# Patient Record
Sex: Female | Born: 1945 | Race: Black or African American | Hispanic: No | Smoking: Former smoker
Health system: Southern US, Community
[De-identification: ages and names within clinical notes are randomized; demographics above are authoritative.]

## PROBLEM LIST (undated history)

## (undated) DIAGNOSIS — I10 Essential (primary) hypertension: Secondary | ICD-10-CM

## (undated) DIAGNOSIS — J449 Chronic obstructive pulmonary disease, unspecified: Secondary | ICD-10-CM

## (undated) HISTORY — PX: ABDOMINAL HYSTERECTOMY: SHX81

---

## 2012-08-04 ENCOUNTER — Inpatient Hospital Stay: Payer: Self-pay | Admitting: Internal Medicine

## 2012-08-04 LAB — CBC
HCT: 41.1 % (ref 35.0–47.0)
HGB: 13.8 g/dL (ref 12.0–16.0)
MCH: 29.4 pg (ref 26.0–34.0)
WBC: 7.4 10*3/uL (ref 3.6–11.0)

## 2012-08-04 LAB — BASIC METABOLIC PANEL
Co2: 27 mmol/L (ref 21–32)
Creatinine: 0.51 mg/dL — ABNORMAL LOW (ref 0.60–1.30)
EGFR (Non-African Amer.): >60
Potassium: 3.5 mmol/L (ref 3.5–5.1)
Sodium: 142 mmol/L (ref 136–145)

## 2012-08-04 LAB — CK TOTAL AND CKMB (NOT AT ARMC): CK-MB: 5.7 ng/mL — ABNORMAL HIGH (ref 0.5–3.6)

## 2012-08-04 LAB — TROPONIN I: Troponin-I: <0.02 ng/mL

## 2012-08-04 LAB — CK-MB: CK-MB: 6.7 ng/mL — ABNORMAL HIGH (ref 0.5–3.6)

## 2012-08-04 LAB — MAGNESIUM: Magnesium: 1.9 mg/dL

## 2012-08-05 LAB — BASIC METABOLIC PANEL
BUN: 21 mg/dL — ABNORMAL HIGH (ref 7–18)
Calcium, Total: 9.2 mg/dL (ref 8.5–10.1)
Chloride: 105 mmol/L (ref 98–107)
Creatinine: 0.55 mg/dL — ABNORMAL LOW (ref 0.60–1.30)
EGFR (African American): >60
Potassium: 3.9 mmol/L (ref 3.5–5.1)
Sodium: 138 mmol/L (ref 136–145)

## 2012-08-05 LAB — CBC WITH DIFFERENTIAL/PLATELET
Basophil #: 0 10*3/uL (ref 0.0–0.1)
Eosinophil %: 0 %
HGB: 13.6 g/dL (ref 12.0–16.0)
MCHC: 34 g/dL (ref 32.0–36.0)
MCV: 87 fL (ref 80–100)
Monocyte %: 1.1 %
Neutrophil %: 86.4 %
Platelet: 241 10*3/uL (ref 150–440)
RBC: 4.59 10*6/uL (ref 3.80–5.20)
RDW: 14.7 % — ABNORMAL HIGH (ref 11.5–14.5)

## 2012-09-06 ENCOUNTER — Emergency Department: Payer: Self-pay | Admitting: Internal Medicine

## 2014-07-08 NOTE — H&P (Signed)
PATIENT NAME:  [name] [name], [name] [name] MR#:  [id number] DATE OF BIRTH:  06/02/1945  DATE OF ADMISSION:  08/04/2012  PRIMARY CARE PHYSICIAN: At [facility].  REFERRING PHYSICIAN: Maricela BoLuna Ragsdale, MD  CHIEF COMPLAINT: Shortness of breath for 1 day.   HISTORY OF PRESENT ILLNESS: The patient is a 69 year old African-American female with past medical history of COPD, nicotine dependence and anxiety, who is presenting to the ER with a chief complaint of 1-day history of worsening of shortness of breath. The patient is reporting that she sees [facility], and her primary care physician has been trying to wean her off of steroids for the past several weeks. The patient was on steroids regarding her COPD for several months, and lately, her steroid was tapered to 2.5 mg p.o. by her primary care physician. Following that, the patient became short of breath, and it has been worse for the past 1 day, associated with dry cough. The patient comes to the ER for worsening of shortness of breath. Here, the patient was given neb treatments with no significant improvement. The patient's pulse ox was at 89% on room air, and she is satting 93% to 94% on 2 liters of oxygen. Hospitalist team is called to admit the patient for acute exacerbation of COPD and being hypoxemic. She has received IV Solu-Medrol 125 mg in the ER. Chest x-ray has revealed no acute findings. The patient denies any other complaints of chest pain, nausea, vomiting, diarrhea.   PAST MEDICAL HISTORY: COPD, anxiety, osteoporosis.   PAST SURGICAL HISTORY: None.   ALLERGIES: No known drug allergies.   HOME MEDICATIONS:  1. Prednisone 2.5 mg 2 times a day.  2. Fluticasone/salmeterol 1 puff inhalation 2 times a day. 3. Cholecalciferol 50,000 international units once a month. 4. BuSpar 5 mg 3 times a day. 5. Alendronate 70 mg once a week.   PSYCHOSOCIAL HISTORY: Lives at home with her granddaughter. She used to smoke 2 to 3 cigarettes per day, but  for the past 1 month, she has been not smoking, trying to quit. Denies any alcohol or illicit drug usage.   FAMILY HISTORY: Mother had hypertension. Dad had history of melanoma.   REVIEW OF SYSTEMS:  CONSTITUTIONAL: Denies any fever, fatigue, weight loss or weight gain.  EYES: Denies any blurry vision, glaucoma, cataracts.  ENT: No ear pain, hearing loss. Denies any snoring, postnasal drip.  RESPIRATIONS: Denies any hemoptysis or painful respirations. Has chronic history of COPD.  CARDIOVASCULAR: No chest pain, palpitations or syncope.  GASTROINTESTINAL: No nausea, vomiting, diarrhea, GERD.  GENITOURINARY: No dysuria, hematuria, renal calculi. GYNECOLOGIC AND BREASTS: No breast mass or vaginal discharge.  ENDOCRINE: No polyuria, nocturia or thyroid problems.  HEMATOLOGIC AND LYMPHATIC: No anemia, easy bruising or bleeding.  INTEGUMENTARY: No acne, rash, lesions.  MUSCULOSKELETAL: No joint pain in the neck, back, shoulder. Denies any gout. NEUROLOGIC: No history of vertigo, ataxia, dementia, CVA.  PSYCHIATRIC: No ADD, OCD or bipolar disorder. Has chronic history of anxiety.  PHYSICAL EXAMINATION: VITAL SIGNS: Temperature 96.6, pulse 113, respirations 16 to 18, blood pressure 155/75, pulse ox 95% on 2 liters.  GENERAL APPEARANCE: Not in any acute distress. Moderately built and moderately nourished.  HEENT: Normocephalic, atraumatic. Pupils are equally reacting to light and accommodation. No scleral icterus. No conjunctival injection. Extraocular movements are intact. No sinus tenderness. No postnasal drip. No pharyngeal exudates.  NECK: Supple. No JVD. No thyromegaly. No lymphadenopathy.  LUNGS: Moderate air entry. Minimal end-expiratory wheezing is present. No accessory muscle  usage. No anterior chest wall tenderness on palpation.  CARDIAC: S1, S2 normal, tachycardic. No murmurs. No gallops. No edema.  GASTROINTESTINAL: Soft. Bowel sounds are positive in all 4 quadrants. Nontender,  nondistended. No masses felt. No hepatosplenomegaly.  NEUROLOGIC: Awake, alert and oriented x3. Cranial nerves II through XII are grossly intact. Motor and sensory are intact. Reflexes are 2+.  EXTREMITIES: No edema. No cyanosis. No clubbing.  MUSCULOSKELETAL: No joint effusion, tenderness or erythema.  SKIN: Warm to touch. Normal turgor. No rashes. No lesions.   DIAGNOSTIC STUDIES: Chest x-ray has chronic COPD changes, prominent vasculature. A 12-lead EKG: Sinus tachycardia with premature atrial complexes, normal PR interval. Left ventricular hypertrophy. No ST-T wave changes acutely. Glucose 98, BUN 14, creatinine 0.51, sodium 140, potassium 3.5, chloride 108, CO2 27, anion gap 7, GFR greater than 60, serum osmolality 284, calcium 9.2. CK total 325, CPK-MB 5.7, troponin less than 0.02. WBC 7.4, hemoglobin 13.8, hematocrit 41.1, platelets 230.   ASSESSMENT AND PLAN:  1. Acute exacerbation of chronic obstructive pulmonary disease. Will admit her to tele bed. Solu-Medrol 60 mg IV q.6 hours will be give. DuoNeb treatments will be provided q.4 hours. Albuterol on as-needed basis. Levofloxacin IV q.24 hours.  2. Anxiety. Continue her home medication, BuSpar.  3. Osteoporosis. Continue alendronate once weekly.  4. Nicotine dependence. The patient is counseled to quit smoking and will provide her nicotine patch.  5. Will provide her gastrointestinal and deep vein thrombosis prophylaxis.   The diagnosis and plan of care were discussed in detail with the patient and her daughter at bedside. They verbalized understanding of the plan.   TOTAL TIME SPENT ON ADMISSION: 50 minutes.   ____________________________ Ramonita Lab, MD ag:OSi D: 08/04/2012 05:45:05 ET T: 08/04/2012 06:59:02 ET JOB#: [id number]  cc: Ramonita Lab, MD, <Dictator> [facility] Ramonita Lab MD ELECTRONICALLY SIGNED 08/06/2012 0:38

## 2014-07-08 NOTE — Discharge Summary (Signed)
PATIENT NAME:  [name] [name], [name] [name] MR#:  [id number] DATE OF BIRTH:  1946/03/04  DATE OF ADMISSION:  08/04/2012 DATE OF DISCHARGE:  08/05/2012   DISCHARGE DIAGNOSES:  1. Acute respiratory failure.  2. Acute chronic obstructive pulmonary disease exacerbation.  3. Tobacco abuse.  4. Hypertension, newly diagnosed.  5. Premature ventricular contractions.   CONSULTATIONS: None.   IMAGING STUDIES DONE: Included a chest x-ray which showed no acute infiltrate, pulmonary edema or pleural effusion.   ADMITTING HISTORY AND PHYSICAL: Please see detailed [name] and P dictated by Dr. Amado CoeGouru on 08/04/2012. In brief, a 69 year old African-American female patient who is a smoker, history of COPD, on chronic steroids, who presented to the hospital complaining of worsening shortness of breath and dry cough. The patient was admitted to the hospitalist service after she had acute respiratory failure, needing 2 liters of oxygen to keep her sats over 88%.   HOSPITAL COURSE:  1. Acute chronic obstructive pulmonary disease exacerbation. The patient was started on scheduled nebulizers, IV steroids and Levaquin. She was continued on 2 liters oxygen, and by the day of discharge, the patient responded well to the treatment, without any wheezing, ambulating without any trouble and sats of 94% on room air on ambulation. The patient has been started on a prednisone taper and will be discharged back home to follow up with her primary care physician.  2. The patient also had elevated blood pressure into the 150s with tachycardia, with occasional PVCs. Has been started on low-dose metoprolol for the same, and her heart rate is much improved and in normal sinus rhythm.   Today on exam, she does not have any wheezing, has good air entry on both sides, ambulated on her own.   DISCHARGE MEDICATIONS: Include: 1. Albuterol nebulizer inhaled every 6 hours as needed.  2. Alendronate 70 mg oral once a week.  3. Cholecalciferol 50,000 international  units oral once a month.  4. Fluticasone/salmeterol 500/50 one puff inhaled 2 times a day.  5. Buspirone 5 mg oral 3 times a day.  6. Prednisone 60 mg tapered over 10 days. The patient to resume her previous 2.5 mg dose daily after this.  7. Metoprolol tartrate 25 mg oral 2 times a day.  8. Levaquin 500 mg oral once a day for 5 days.   DISCHARGE INSTRUCTIONS:  1. Low-sodium diet.  2. Activity as tolerated.  3. Follow up with primary care physician in 1 to 2 weeks.  4. The patient has been counseled to quit smoking.   TIME SPENT: The time spent on day of discharge in discharge activity was 40 minutes.   ____________________________ Molinda BailiffSrikar R. Makenley Shimp, MD srs:OSi D: 08/05/2012 14:59:49 ET T: 08/05/2012 15:17:13 ET JOB#: [id number]  cc: Wardell HeathSrikar R. Gelisa Tieken, MD, <Dictator> [place] [facility][name].F[facility] Dr. Jory SimsHines  [name] [name] R Joshuan Bolander MD ELECTRONICALLY SIGNED 08/17/2012 23:29

## 2014-09-04 ENCOUNTER — Inpatient Hospital Stay: Payer: Medicare Other

## 2014-09-04 ENCOUNTER — Encounter: Payer: Self-pay | Admitting: Emergency Medicine

## 2014-09-04 ENCOUNTER — Inpatient Hospital Stay
Admission: EM | Admit: 2014-09-04 | Discharge: 2014-09-12 | DRG: 208 | Disposition: A | Discharge status: Home Health | Payer: Medicare Other | Attending: Radiology | Admitting: Radiology

## 2014-09-04 ENCOUNTER — Emergency Department: Payer: Medicare Other

## 2014-09-04 DIAGNOSIS — Z808 Family history of malignant neoplasm of other organs or systems: Secondary | ICD-10-CM | POA: Diagnosis not present

## 2014-09-04 DIAGNOSIS — Z7952 Long term (current) use of systemic steroids: Secondary | ICD-10-CM | POA: Diagnosis not present

## 2014-09-04 DIAGNOSIS — J449 Chronic obstructive pulmonary disease, unspecified: Secondary | ICD-10-CM | POA: Diagnosis present

## 2014-09-04 DIAGNOSIS — I248 Other forms of acute ischemic heart disease: Secondary | ICD-10-CM | POA: Diagnosis present

## 2014-09-04 DIAGNOSIS — Z452 Encounter for adjustment and management of vascular access device: Secondary | ICD-10-CM

## 2014-09-04 DIAGNOSIS — B9689 Other specified bacterial agents as the cause of diseases classified elsewhere: Secondary | ICD-10-CM | POA: Diagnosis present

## 2014-09-04 DIAGNOSIS — J9602 Acute respiratory failure with hypercapnia: Secondary | ICD-10-CM | POA: Diagnosis present

## 2014-09-04 DIAGNOSIS — K219 Gastro-esophageal reflux disease without esophagitis: Secondary | ICD-10-CM | POA: Diagnosis present

## 2014-09-04 DIAGNOSIS — J069 Acute upper respiratory infection, unspecified: Secondary | ICD-10-CM

## 2014-09-04 DIAGNOSIS — J40 Bronchitis, not specified as acute or chronic: Secondary | ICD-10-CM | POA: Diagnosis present

## 2014-09-04 DIAGNOSIS — Z8249 Family history of ischemic heart disease and other diseases of the circulatory system: Secondary | ICD-10-CM

## 2014-09-04 DIAGNOSIS — I77810 Thoracic aortic ectasia: Secondary | ICD-10-CM | POA: Diagnosis present

## 2014-09-04 DIAGNOSIS — J441 Chronic obstructive pulmonary disease with (acute) exacerbation: Secondary | ICD-10-CM | POA: Diagnosis present

## 2014-09-04 DIAGNOSIS — Z9911 Dependence on respirator [ventilator] status: Secondary | ICD-10-CM

## 2014-09-04 DIAGNOSIS — J9690 Respiratory failure, unspecified, unspecified whether with hypoxia or hypercapnia: Secondary | ICD-10-CM | POA: Diagnosis present

## 2014-09-04 DIAGNOSIS — Z87891 Personal history of nicotine dependence: Secondary | ICD-10-CM

## 2014-09-04 DIAGNOSIS — N179 Acute kidney failure, unspecified: Secondary | ICD-10-CM | POA: Diagnosis not present

## 2014-09-04 DIAGNOSIS — M810 Age-related osteoporosis without current pathological fracture: Secondary | ICD-10-CM | POA: Diagnosis present

## 2014-09-04 DIAGNOSIS — J9801 Acute bronchospasm: Secondary | ICD-10-CM

## 2014-09-04 DIAGNOSIS — F449 Dissociative and conversion disorder, unspecified: Secondary | ICD-10-CM | POA: Diagnosis not present

## 2014-09-04 DIAGNOSIS — I429 Cardiomyopathy, unspecified: Secondary | ICD-10-CM | POA: Diagnosis present

## 2014-09-04 DIAGNOSIS — J9611 Chronic respiratory failure with hypoxia: Secondary | ICD-10-CM

## 2014-09-04 DIAGNOSIS — Z515 Encounter for palliative care: Secondary | ICD-10-CM | POA: Diagnosis not present

## 2014-09-04 DIAGNOSIS — Z4659 Encounter for fitting and adjustment of other gastrointestinal appliance and device: Secondary | ICD-10-CM

## 2014-09-04 DIAGNOSIS — I10 Essential (primary) hypertension: Secondary | ICD-10-CM | POA: Diagnosis present

## 2014-09-04 DIAGNOSIS — Z7951 Long term (current) use of inhaled steroids: Secondary | ICD-10-CM | POA: Diagnosis not present

## 2014-09-04 DIAGNOSIS — N390 Urinary tract infection, site not specified: Secondary | ICD-10-CM | POA: Diagnosis present

## 2014-09-04 HISTORY — DX: Essential (primary) hypertension: I10

## 2014-09-04 HISTORY — DX: Chronic obstructive pulmonary disease, unspecified: J44.9

## 2014-09-04 LAB — BLOOD GAS, ARTERIAL
Acid-base deficit: 2.1 mmol/L — ABNORMAL HIGH (ref 0.0–2.0)
Allens test (pass/fail): POSITIVE — AB
Bicarbonate: 24.7 mEq/L (ref 21.0–28.0)
FIO2: 0.28 %
MECHVT: 500 mL
Mechanical Rate: 24
O2 Saturation: 87.4 %
PATIENT TEMPERATURE: 37
PEEP: 5 cmH2O
pCO2 arterial: 49 mmHg — ABNORMAL HIGH (ref 32.0–48.0)
pH, Arterial: 7.31 — ABNORMAL LOW (ref 7.350–7.450)
pO2, Arterial: 59 mmHg — ABNORMAL LOW (ref 83.0–108.0)

## 2014-09-04 LAB — HEPATIC FUNCTION PANEL
ALBUMIN: 4 g/dL (ref 3.5–5.0)
ALT: 23 U/L (ref 14–54)
AST: 34 U/L (ref 15–41)
Alkaline Phosphatase: 85 U/L (ref 38–126)
Bilirubin, Direct: 0.3 mg/dL (ref 0.1–0.5)
Indirect Bilirubin: 0.3 mg/dL (ref 0.3–0.9)
TOTAL PROTEIN: 7.8 g/dL (ref 6.5–8.1)
Total Bilirubin: 0.6 mg/dL (ref 0.3–1.2)

## 2014-09-04 LAB — URINALYSIS COMPLETE WITH MICROSCOPIC (ARMC ONLY)
BILIRUBIN URINE: NEGATIVE
Glucose, UA: NEGATIVE mg/dL
Ketones, ur: NEGATIVE mg/dL
Leukocytes, UA: NEGATIVE
Nitrite: NEGATIVE
PH: 5 (ref 5.0–8.0)
PROTEIN: 100 mg/dL — AB
SPECIFIC GRAVITY, URINE: 1.02 (ref 1.005–1.030)

## 2014-09-04 LAB — BASIC METABOLIC PANEL
Anion gap: 9 (ref 5–15)
BUN: 11 mg/dL (ref 6–20)
CO2: 29 mmol/L (ref 22–32)
Calcium: 8.7 mg/dL — ABNORMAL LOW (ref 8.9–10.3)
Chloride: 100 mmol/L — ABNORMAL LOW (ref 101–111)
Creatinine, Ser: 0.61 mg/dL (ref 0.44–1.00)
GFR calc Af Amer: >60 mL/min (ref >60)
GFR calc non Af Amer: >60 mL/min (ref >60)
Glucose, Bld: 168 mg/dL — ABNORMAL HIGH (ref 65–99)
Potassium: 4.8 mmol/L (ref 3.5–5.1)
Sodium: 138 mmol/L (ref 135–145)

## 2014-09-04 LAB — CBC
HCT: 46.7 % (ref 35.0–47.0)
HEMOGLOBIN: 15 g/dL (ref 12.0–16.0)
MCH: 29.4 pg (ref 26.0–34.0)
MCHC: 32 g/dL (ref 32.0–36.0)
MCV: 91.9 fL (ref 80.0–100.0)
Platelets: 203 10*3/uL (ref 150–440)
RBC: 5.08 MIL/uL (ref 3.80–5.20)
RDW: 14.3 % (ref 11.5–14.5)
WBC: 9.6 10*3/uL (ref 3.6–11.0)

## 2014-09-04 LAB — MRSA PCR SCREENING: MRSA by PCR: NEGATIVE

## 2014-09-04 LAB — GLUCOSE, CAPILLARY: Glucose-Capillary: 143 mg/dL — ABNORMAL HIGH (ref 65–99)

## 2014-09-04 LAB — HEMOGLOBIN A1C: Hgb A1c MFr Bld: 6 % (ref 4.0–6.0)

## 2014-09-04 LAB — TROPONIN I
Troponin I: <0.03 ng/mL (ref <0.031)
Troponin I: 0.38 ng/mL — ABNORMAL HIGH (ref <0.031)

## 2014-09-04 MED ORDER — ALBUTEROL SULFATE (2.5 MG/3ML) 0.083% IN NEBU
INHALATION_SOLUTION | RESPIRATORY_TRACT | Status: AC
  Administered 2014-09-04: 11:00:00
  Filled 2014-09-04: qty 12

## 2014-09-04 MED ORDER — CETYLPYRIDINIUM CHLORIDE 0.05 % MT LIQD
7.0000 mL | Freq: Four times a day (QID) | OROMUCOSAL | Status: DC
  Administered 2014-09-05 – 2014-09-07 (×10): 7 mL via OROMUCOSAL

## 2014-09-04 MED ORDER — FENTANYL 2500MCG IN NS 250ML (10MCG/ML) PREMIX INFUSION
10.0000 ug/h | INTRAVENOUS | Status: DC
  Administered 2014-09-04 – 2014-09-05 (×2): 10 ug/h via INTRAVENOUS
  Administered 2014-09-05: 175 ug/h via INTRAVENOUS
  Administered 2014-09-06: 20 ug/h via INTRAVENOUS
  Filled 2014-09-04 (×4): qty 250

## 2014-09-04 MED ORDER — FENTANYL CITRATE (PF) 100 MCG/2ML IJ SOLN
INTRAMUSCULAR | Status: AC
  Filled 2014-09-04: qty 2

## 2014-09-04 MED ORDER — LORAZEPAM 2 MG/ML IJ SOLN
INTRAMUSCULAR | Status: AC
  Filled 2014-09-04: qty 1

## 2014-09-04 MED ORDER — PANTOPRAZOLE SODIUM 40 MG IV SOLR
40.0000 mg | Freq: Every day | INTRAVENOUS | Status: DC
  Administered 2014-09-04: 40 mg via INTRAVENOUS
  Filled 2014-09-04: qty 40

## 2014-09-04 MED ORDER — SUCCINYLCHOLINE CHLORIDE 20 MG/ML IJ SOLN
INTRAMUSCULAR | Status: DC | PRN
  Administered 2014-09-04: 120 mg via INTRAVENOUS

## 2014-09-04 MED ORDER — NOREPINEPHRINE 4 MG/250ML-% IV SOLN
0.0000 ug/min | INTRAVENOUS | Status: DC
  Administered 2014-09-05: 2 ug/min via INTRAVENOUS
  Filled 2014-09-04: qty 250

## 2014-09-04 MED ORDER — PANTOPRAZOLE SODIUM 40 MG IV SOLR: 40.0000 mg | Freq: Two times a day (BID) | INTRAVENOUS | Status: DC

## 2014-09-04 MED ORDER — IOHEXOL 350 MG/ML SOLN
75.0000 mL | Freq: Once | INTRAVENOUS | Status: AC | PRN
  Administered 2014-09-04: 75 mL via INTRAVENOUS

## 2014-09-04 MED ORDER — METOPROLOL TARTRATE 25 MG PO TABS
25.0000 mg | ORAL_TABLET | Freq: Two times a day (BID) | ORAL | Status: DC
  Administered 2014-09-04 – 2014-09-12 (×14): 25 mg via ORAL
  Filled 2014-09-04 (×14): qty 1

## 2014-09-04 MED ORDER — PROPOFOL 1000 MG/100ML IV EMUL
5.0000 ug/kg/min | Freq: Once | INTRAVENOUS | Status: AC
  Administered 2014-09-04: 5 ug/kg/min via INTRAVENOUS

## 2014-09-04 MED ORDER — PNEUMOCOCCAL VAC POLYVALENT 25 MCG/0.5ML IJ INJ
0.5000 mL | INJECTION | INTRAMUSCULAR | Status: DC
  Filled 2014-09-04: qty 0.5

## 2014-09-04 MED ORDER — NOREPINEPHRINE BITARTRATE 1 MG/ML IV SOLN: 0.0000 ug/min | INTRAVENOUS | Status: DC

## 2014-09-04 MED ORDER — ALBUTEROL (5 MG/ML) CONTINUOUS INHALATION SOLN
10.0000 mg/h | INHALATION_SOLUTION | Freq: Once | RESPIRATORY_TRACT | Status: AC
Start: 2014-09-04 — End: 2014-09-04
  Administered 2014-09-04: 10 mg/h via RESPIRATORY_TRACT
  Filled 2014-09-04: qty 20

## 2014-09-04 MED ORDER — FENTANYL CITRATE (PF) 100 MCG/2ML IJ SOLN
75.0000 ug | Freq: Once | INTRAMUSCULAR | Status: AC
  Administered 2014-09-04: 75 ug via INTRAVENOUS

## 2014-09-04 MED ORDER — HEPARIN SODIUM (PORCINE) 5000 UNIT/ML IJ SOLN
5000.0000 [IU] | Freq: Three times a day (TID) | INTRAMUSCULAR | Status: DC
  Administered 2014-09-04 – 2014-09-05 (×3): 5000 [IU] via SUBCUTANEOUS
  Filled 2014-09-04 (×3): qty 1

## 2014-09-04 MED ORDER — LORAZEPAM 2 MG/ML IJ SOLN
1.0000 mg | Freq: Once | INTRAMUSCULAR | Status: AC
  Administered 2014-09-04: 1 mg via INTRAVENOUS

## 2014-09-04 MED ORDER — CHLORHEXIDINE GLUCONATE 0.12 % MT SOLN
15.0000 mL | Freq: Two times a day (BID) | OROMUCOSAL | Status: DC
  Administered 2014-09-04 – 2014-09-07 (×6): 15 mL via OROMUCOSAL

## 2014-09-04 MED ORDER — MAGNESIUM SULFATE 2 GM/50ML IV SOLN
2.0000 g | Freq: Once | INTRAVENOUS | Status: AC
  Administered 2014-09-04: 2 g via INTRAVENOUS

## 2014-09-04 MED ORDER — ETOMIDATE 2 MG/ML IV SOLN
INTRAVENOUS | Status: DC | PRN
  Administered 2014-09-04: 20 mg via INTRAVENOUS

## 2014-09-04 MED ORDER — DEXMEDETOMIDINE HCL IN NACL 400 MCG/100ML IV SOLN: 0.4000 ug/kg/h | INTRAVENOUS | Status: DC

## 2014-09-04 MED ORDER — PROPOFOL 1000 MG/100ML IV EMUL
INTRAVENOUS | Status: AC
  Administered 2014-09-04: 5 ug/kg/min via INTRAVENOUS
  Filled 2014-09-04: qty 100

## 2014-09-04 MED ORDER — IPRATROPIUM-ALBUTEROL 0.5-2.5 (3) MG/3ML IN SOLN
3.0000 mL | RESPIRATORY_TRACT | Status: DC
  Administered 2014-09-04 – 2014-09-08 (×23): 3 mL via RESPIRATORY_TRACT
  Filled 2014-09-04 (×23): qty 3

## 2014-09-04 MED ORDER — ASPIRIN 81 MG PO CHEW
81.0000 mg | CHEWABLE_TABLET | Freq: Every day | ORAL | Status: DC
  Administered 2014-09-04 – 2014-09-08 (×4): 81 mg via ORAL
  Filled 2014-09-04 (×5): qty 1

## 2014-09-04 MED ORDER — METHYLPREDNISOLONE SODIUM SUCC 125 MG IJ SOLR
60.0000 mg | Freq: Four times a day (QID) | INTRAMUSCULAR | Status: DC
  Administered 2014-09-04 – 2014-09-05 (×4): 60 mg via INTRAVENOUS
  Filled 2014-09-04 (×5): qty 2

## 2014-09-04 MED ORDER — LEVOFLOXACIN IN D5W 500 MG/100ML IV SOLN
500.0000 mg | INTRAVENOUS | Status: DC
  Administered 2014-09-04: 500 mg via INTRAVENOUS
  Filled 2014-09-04 (×2): qty 100

## 2014-09-04 NOTE — ED Notes (Signed)
Patient to ED via EMS with c.o respiratory distress. Per EMS patient has had increase of difficulty breathing for two days. Patient initial room saturations of 85%. Patient placed on CPAP by EMS, saturations elevated to 93%. Patient alert and oriented. Patient can follow instructions. Patient hx COPD. Patient denies any history of previous intubations. Patient CBG 144. Bilateral wheezing.

## 2014-09-04 NOTE — Progress Notes (Signed)
DR. Dema Severin present and gave order for fentanyl drip and precedex drips and to d/c propofol. MD also aware that patient's NG tube is at 28cm at nare and that it hasnt been verified. Dr. Dema Severin stated "take the NG out and place an OG and get a KUB to verify."

## 2014-09-04 NOTE — Progress Notes (Addendum)
Spoke with Dr. Thedore Mins about Pt's drop in BP 51/35, HR 110-120's, and elevated Troponin .38. Orders for a PICC line to be placed and Levophed were given to address Pt's drop in BP. [facility] Cardiology consult was ordered as well as a daily Aspirin.

## 2014-09-04 NOTE — ED Provider Notes (Signed)
[facility] Emergency Department Provider Note   ____________________________________________  Time seen: On arrival 8:50 AM by EMS I have reviewed the triage vital signs and the triage nursing note.  HISTORY  Chief Complaint Respiratory Distress   Historian Limited per patient due to respiratory distress, EMS provides history  HPI [name] [name] is a 69 y.o. female who is having respiratory distress and trouble breathing. Apparently this started about 2 days ago. She does not have a history of breathing problems reportedly. She was severely wheezing upon EMS arrival and was given a DuoNeb treatment followed by CPAP due to the fact that she did not improve very well. She denies chest pain. Symptoms are severe with regard to the respiratory distress. No recent cough or fevers.    Past Medical History  Diagnosis Date  . COPD (chronic obstructive pulmonary disease)   . Hypertension    unknown  Patient Active Problem List   Diagnosis Date Noted  . COPD exacerbation 09/04/2014    History reviewed. No pertinent past surgical history.  Current Outpatient Rx  Name  Route  Sig  Dispense  Refill  . ADVAIR DISKUS 500-50 MCG/DOSE AEPB   Inhalation   Inhale 1 puff into the lungs every 12 (twelve) hours.           Dispense as written.   [name] albuterol (PROVENTIL) (2.5 MG/3ML) 0.083% nebulizer solution   Inhalation   Inhale 3 mLs into the lungs every 6 (six) hours as needed. For wheezing         . metoprolol tartrate (LOPRESSOR) 25 MG tablet   Oral   Take 25 mg by mouth 2 (two) times daily.         . predniSONE (DELTASONE) 5 MG tablet   Oral   Take 5 mg by mouth daily.         . VENTOLIN HFA 108 (90 BASE) MCG/ACT inhaler   Inhalation   Inhale 2 puffs into the lungs every 6 (six) hours as needed. For wheezing           Dispense as written.   . Vitamin D, Ergocalciferol, (DRISDOL) 50000 UNITS CAPS capsule   Oral   Take 50,000 Units by mouth  once a week.           Allergies Review of patient's allergies indicates no known allergies. None No family history on file.  Social History History  Substance Use Topics  . Smoking status: Former Games developer  . Smokeless tobacco: Not on file  . Alcohol Use: No   unable to obtain due to patient's respiratory distress   Review of Systems Limited due to patient's respiratory distress Constitutional: Negative for fever. Eyes:  ENT:  Cardiovascular: Negative for chest pain. Respiratory: Positive for shortness of breath. Gastrointestinal: Negative for abdominal pain, vomiting and diarrhea. Genitourinary:  Musculoskeletal: Skin:  Neurological: Negative for focal weakness or numbness.  ____________________________________________   PHYSICAL EXAM:  VITAL SIGNS: ED Triage Vitals  Enc Vitals Group     BP 09/04/14 0853 165/100 mmHg     Pulse Rate 09/04/14 0853 124     Resp 09/04/14 0853 30     Temp --      Temp src --      SpO2 09/04/14 0853 100 %     Weight 09/04/14 0853 169 lb (76.658 kg)     Height 09/04/14 0853  (1.651 m)     Head Cir --      Peak Flow --  Pain Score --      Pain Loc --      Pain Edu? --      Excl. in GC? --      Constitutional: Alert.  Trying to talk around BiPAP. Moderate respiratory distress  Eyes: Conjunctivae are normal. PERRL. Normal extraocular movements. ENT   Head: Normocephalic and atraumatic.   Nose: No congestion/rhinnorhea.   Mouth/Throat: Mucous membranes are moist.   Neck: No stridor. Cardiovascular: Tachycardic and regular.  No murmurs, rubs, or gallops. Respiratory: Severe decreased air movement throughout with significant wheezing in all lung fields. Moderate retractions.  Gastrointestinal: Soft. No distention, no guarding, no rebound. Nontender  Genitourinary/rectal: Deferred Musculoskeletal: Nontender with normal range of motion in all extremities. Plus edema bilateral lower shoulder is without calf  tenderness.  Neurologic: No gross focal neurologic deficits are appreciated. No confusion. Patient is anxious and slightly agitated. Skin:  Skin is warm, dry and intact. No rash noted.   ____________________________________________   EKG I, Governor Rooks, MD, the attending physician have personally viewed and interpreted all ECGs.  125 beats a minute. Sinus tachycardia. Narrow QRS. Normal axis. Nonspecific ST and T-wave. ____________________________________________  LABS (pertinent positives/negatives)  PH 7.16 with a PCO2 of 91 Metabolic panel within normal limits CBC within normal limits Troponin less than 0.03  ____________________________________________  RADIOLOGY Imaging viewed by me, and interpreted by Radiologist   Chest x-ray one: Boardline cardiac megaly without decompensation Chest x-ray 2 post intubation: Discussed with the radiologist: ET tube in good position, indeterminate location of the NG tube and cannot exclude endotracheal course.   CT chest noncontrast:   IMPRESSION: No evidence of acute pulmonary thromboembolism.  Bilateral tiny ill-defined nodules and ground-glass opacities most likely due to an inflammatory process. Three-month follow-up CT is recommended to ensure resolution of these findings. Initial follow-up by chest CT without contrast is recommended in 3 months to confirm persistence. This recommendation follows the consensus statement: Recommendations for the Management of Subsolid Pulmonary Nodules Detected at CT: A Statement from the Fleischner Society as published in Radiology 2013; 266:304-317.  Emphysema.  Endotracheal and NG tubes.  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 Recommend semi-annual imaging followup by CTA or MRA and referral to cardiothoracic surgery if not already obtained. This recommendation follows 2010 ACCF/AHA/AATS/ACR/ASA/SCA/SCAI/SIR/STS/SVM Guidelines for the Diagnosis and Management of  Patients With Thoracic Aortic Disease. Circulation. 2010; 121: [id number] __________________________________________  PROCEDURES  Procedure(s) performed:  INTUBATION Performed by: Governor Rooks  Required items: required blood products, implants, devices, and special equipment available Patient identity confirmed: provided demographic data and hospital-assigned identification number Time out: Immediately prior to procedure a "time out" was called to verify the correct patient, procedure, equipment, support staff and site/side marked as required.  Indications: hypercarbic respiratory failure  Intubation method: Glidescope Laryngoscopy   Preoxygenation: 75% oxygenation on BiPAP for 5 minutes Sedatives: Etomidate Paralytic: 120 mg Succinylcholine  Tube Size: 8.0 cuffed  Post-procedure assessment: chest rise and ETCO2 monitor Breath sounds: equal and absent over the epigastrium Tube secured with: ETT holder   Patient tolerated the procedure well with no immediate complications.    Critical Care performed: CRITICAL CARE Performed by: Governor Rooks   Total critical care time: 70 minutes  Critical care time was exclusive of separately billable procedures and treating other patients.  Critical care was necessary to treat or prevent imminent or life-threatening deterioration.  Critical care was time spent personally by me on the following activities: development of treatment plan with  patient and/or surrogate as well as nursing, discussions with consultants, evaluation of patient's response to treatment, examination of patient, obtaining history from patient or surrogate, ordering and performing treatments and interventions, ordering and review of laboratory studies, ordering and review of radiographic studies, pulse oximetry and re-evaluation of patient's condition.   ____________________________________________   ED COURSE / ASSESSMENT AND PLAN  Pertinent labs &  imaging results that were available during my care of the patient were reviewed by me and considered in my medical decision making (see chart for details).  Patient arrived on CPAP, and this was continued. Patient is not hypoxic on the BiPAP, however she was hypoxic to 85% per EMS in the field when they got her. Symptoms seem likely bronchospasm however and his heart failure is a consideration. She is in moderate respiratory distress, but CPAP was helping, and without any altered mental status and some reported improvement, no indication for intubation at this  Point in time.  On reexamination patient seems more somnolent despite being on CPAP. I updated the family with the likelihood that if her CO2 is elevated she may need tracheal intubation. I went over the scope of care and family does want her to be DO NOT RESUSCITATE, however for reversible pulmonary cause they are interested in having her intubated necessary.  Her ABG did show acidosis, respiratory. Preparations were made for intubation.  Patient was intubated without problem. She was placed on propofol for sedation.  Profile caused some mild decreased blood pressure, an additional dose of when necessary fentanyl was given with good sedation.  Radiologist called with the equivocal report of the NG tube, and suggested either advancing further and repeat x-ray in, or pulling the tube out. I told the nurse to pull the tube out and replace it which she did. I consulted the hospitalist for admission.  The admitting physician recommended CT of the chest to evaluate the NG tube to issue to assess for any concern about perforation. Clinically the patient is not having any change in her clinical status.   I discussed return precautions, follow-up instructions, and discharged instructions with patient and/or family.  ___________________________________________   FINAL CLINICAL IMPRESSION(S) / ED DIAGNOSES   Final diagnoses:  Bronchospasm,  acute  Acute respiratory failure with hypercapnia      Governor Rooks, MD 09/04/14 1457

## 2014-09-04 NOTE — Progress Notes (Signed)
Sedated comfortably on fentanyl drip. VSS. Stach per cardiac monitor. Daughter and grandaughter visited and were updated on plan of care. This RN paged Dr. Clent Ridges to ask her to look at KUB to verify OG tube placement but MD has not returned page.

## 2014-09-04 NOTE — Progress Notes (Signed)
PULMONARY / CRITICAL CARE MEDICINE   Name: [name] [name] MRN: [id number] DOB: 02-Apr-1945    ADMISSION DATE:  09/04/2014 CONSULTATION DATE:  09/04/14  REFERRING MD :  Dr. Clent Ridges   CHIEF COMPLAINT:    "short of breath"  HISTORY OF PRESENT ILLNESS   69 yo female with PMHx of COPD (Severe), Nicotine abuse, OA, presented to [facility] ED with acute shortness of breath after having "URI" symptoms for the past couple of days, sick contact possibly grand kids.  History per chart, as patient is intubated and sedated.  She was recently seen on 09/01/14 at her PMD office, Dr. Marcello Fennel, and was noted to be in normal state of health.  Smokes 1 pack every 3 days - per clinic note on 6/16. In the ED, was initially place on bipap, no improvement in VBG (ph 7.16, pCO2 91), worsening respiratory distress and was intubated given poor respiratory status and hypercapnia.    Dr. Meredeth Ide Office Visit 04/05/14 History of Present Illness: [name] [name] is a 69 y.o. female that presents to clinic for follow up for severe copd, has a cough, min phlegm. There is no blood. No fever or chills. No chest pain or pleurisy. No leg edema or calf pain. She is not on spiriva.   Problem List:  Patient Active Problem List  Diagnosis  . COPD, severe   Current Medications:  Current Outpatient Prescriptions  Medication Sig Dispense Refill  . albuterol (PROVENTIL) 2.5 mg /3 mL (0.083 %) nebulizer solution Take 3 mLs (2.5 mg total) by nebulization every 6 (six) hours as needed for Wheezing. 120 ampule 5  . albuterol 90 mcg/actuation inhaler Inhale 2 inhalations into the lungs every 6 (six) hours as needed for Wheezing. 1 Inhaler 12  . fluticasone-salmeterol (ADVAIR DISKUS) 500-50 mcg/dose diskus inhaler Inhale 1 inhalation into the lungs every 12 (twelve) hours. 1 Inhaler 12  . metoprolol tartrate (LOPRESSOR) 25 MG tablet Take 1 tablet (25 mg total) by mouth 2 (two) times daily. 60 tablet 3  . predniSONE (DELTASONE) 5 MG tablet Take 1  tablet (5 mg total) by mouth once daily. 30 tablet 6   SPIROMETRY: FVC was 2.06 liters, 89% of predicted FEV1 was 0.77, 42% of predicted FEV1 ratio was 38 FEF 25-75% liters per second was 10% of predicted  FLOW VOLUME LOOP: Expiratory flow volume loop is quite delayed  Summary: Severe obstruction on spirometry  Impression: Severe copd, no change, holding her own.  Slow resolving bronchitis   Plan: -stay on the same copd regimen -mucinex -start spiriva  -zpak -follow up in 18 weeks    SIGNIFICANT EVENTS  6/19 - intubated for hypercapenic respiratory failure in [facility] ED      PAST MEDICAL HISTORY    :  Past Medical History  Diagnosis Date  . COPD (chronic obstructive pulmonary disease)   . Hypertension    Past Surgical History  Procedure Laterality Date  . Abdominal hysterectomy     Prior to Admission medications   Medication Sig Start Date End Date Taking? Authorizing Provider  ADVAIR DISKUS 500-50 MCG/DOSE AEPB Inhale 1 puff into the lungs every 12 (twelve) hours. 08/09/14  Yes Historical Provider, MD  albuterol (PROVENTIL) (2.5 MG/3ML) 0.083% nebulizer solution Inhale 3 mLs into the lungs every 6 (six) hours as needed. For wheezing 02/15/14  Yes Historical Provider, MD  metoprolol tartrate (LOPRESSOR) 25 MG tablet Take 25 mg by mouth 2 (two) times daily. 08/05/14  Yes Historical Provider, MD  predniSONE (DELTASONE) 5 MG  tablet Take 5 mg by mouth daily. 08/19/14  Yes Historical Provider, MD  VENTOLIN HFA 108 (90 BASE) MCG/ACT inhaler Inhale 2 puffs into the lungs every 6 (six) hours as needed. For wheezing 08/30/14  Yes Historical Provider, MD  Vitamin D, Ergocalciferol, (DRISDOL) 50000 UNITS CAPS capsule Take 50,000 Units by mouth once a week. 09/01/14  Yes Historical Provider, MD   No Known Allergies   FAMILY HISTORY   Family History  Problem Relation Age of Onset  . Hypertension Mother   . Melanoma Father       SOCIAL HISTORY    reports that she has  quit smoking. She does not have any smokeless tobacco history on file. She reports that she does not drink alcohol. Her drug history is not on file.  ROS - unable to obtain, patient intubated and sedated    VITAL SIGNS    Temp:  [98.4 F (36.9 C)-99.5 F (37.5 C)] 99.5 F (37.5 C) (06/19 1800) Pulse Rate:  [98-127] 124 (06/19 2014) Resp:  [16-35] 24 (06/19 2014) BP: (51-165)/(35-117) 66/50 mmHg (06/19 2014) SpO2:  [91 %-100 %] 91 % (06/19 2014) FiO2 (%):  [28 %-35 %] 28 % (06/19 1920) Weight:  [159 lb 2.8 oz (72.2 kg)-169 lb (76.658 kg)] 159 lb 2.8 oz (72.2 kg) (06/19 1433) HEMODYNAMICS:   VENTILATOR SETTINGS: Vent Mode:  [-] AC FiO2 (%):  [35 %] 35 % Set Rate:  [24 bmp] 24 bmp Vt Set:  [450 mL] 450 mL PEEP:  [5 cmH20] 5 cmH20 INTAKE / OUTPUT:  Intake/Output Summary (Last 24 hours) at 09/04/14 1350 Last data filed at 09/04/14 1334  Gross per 24 hour  Intake      0 ml  Output     50 ml  Net    -50 ml       PHYSICAL EXAM   Physical Exam General: on mechanical ventialtor Skin: No suspicious lesions or moles.  Eyes: Sclera and conjunctiva clear; pupils equal round and reactive to light and accommodation; extraocular movements intact Ears: External ears and canal normal; tympanic membranes normal.  Nose: Mucosa healthy without drainage or ulceration Oropharynx: No suspicious lesions Neck: No swelling, masses, stiffness, pain, limited movement, carotid pulses normal bilaterally, thyroid normal size, no masses palpated. No bruits heard. Lungs: intubated, diffuse expiratory wheezes, dec basilar breath sounds.  Cardiovascular: Heart regular rate and rhythm without murmurs, gallops, or rubs Abdomen: Soft; non tender; non distended; no masses or organomegaly Lymph Nodes: No significant cervical or supraclavicular lymphadenopathy noted Musculoskeletal: no active joint inflammation Extremities: Normal, no edema Neurologic: Alert and oriented; speech intact; face  symmetrical; moves all extremities well      LABS   LABS:  CBC  Recent Labs Lab 09/04/14 0853  WBC 9.6  HGB 15.0  HCT 46.7  PLT 203   Coag's No results for input(s): APTT, INR in the last 168 hours. BMET  Recent Labs Lab 09/04/14 0853  NA 138  K 4.8  CL 100*  CO2 29  BUN 11  CREATININE 0.61  GLUCOSE 168*   Electrolytes  Recent Labs Lab 09/04/14 0853  CALCIUM 8.7*   Sepsis Markers No results for input(s): LATICACIDVEN, PROCALCITON, O2SATVEN in the last 168 hours. ABG No results for input(s): PHART, PCO2ART, PO2ART in the last 168 hours. Liver Enzymes  Recent Labs Lab 09/04/14 0853  AST 34  ALT 23  ALKPHOS 85  BILITOT 0.6  ALBUMIN 4.0   Cardiac Enzymes  Recent Labs Lab 09/04/14 0853  TROPONINI <  0.03   Glucose No results for input(s): GLUCAP in the last 168 hours.   No results found for this or any previous visit (from the past 240 hour(s)).   Current facility-administered medications:  .  etomidate (AMIDATE) injection, , Intravenous, PRN, Governor Rooks, MD, 20 mg at 09/04/14 1158 .  ipratropium-albuterol (DUONEB) 0.5-2.5 (3) MG/3ML nebulizer solution 3 mL, 3 mL, Nebulization, Q4H, Gale Journey, MD .  levofloxacin (LEVAQUIN) IVPB 500 mg, 500 mg, Intravenous, Q24H, Gale Journey, MD .  methylPREDNISolone sodium succinate (SOLU-MEDROL) 125 mg/2 mL injection 60 mg, 60 mg, Intravenous, Q6H, Gale Journey, MD .  succinylcholine (ANECTINE) injection, , Intravenous, PRN, Governor Rooks, MD, 120 mg at 09/04/14 1202  Current outpatient prescriptions:  .  ADVAIR DISKUS 500-50 MCG/DOSE AEPB, Inhale 1 puff into the lungs every 12 (twelve) hours., Disp: , Rfl:  .  albuterol (PROVENTIL) (2.5 MG/3ML) 0.083% nebulizer solution, Inhale 3 mLs into the lungs every 6 (six) hours as needed. For wheezing, Disp: , Rfl:  .  metoprolol tartrate (LOPRESSOR) 25 MG tablet, Take 25 mg by mouth 2 (two) times daily., Disp: , Rfl:  .  predniSONE (DELTASONE) 5  MG tablet, Take 5 mg by mouth daily., Disp: , Rfl:  .  VENTOLIN HFA 108 (90 BASE) MCG/ACT inhaler, Inhale 2 puffs into the lungs every 6 (six) hours as needed. For wheezing, Disp: , Rfl:  .  Vitamin D, Ergocalciferol, (DRISDOL) 50000 UNITS CAPS capsule, Take 50,000 Units by mouth once a week., Disp: , Rfl:   IMAGING    Dg Chest Portable 1 View  09/04/2014   CLINICAL DATA:  Intubation  EXAM: PORTABLE CHEST - 1 VIEW  COMPARISON:  08/1914  FINDINGS: Interval intubation with endotracheal tube 5 cm from carina in good position. NG tube terminates in the mid mediastinum and deflected just left of the vertebral column. Cannot exclude endotracheal location of the NG tube.  Lungs are well inflated.  No pneumothorax.  No pulmonary edema.  IMPRESSION: 1. Endotracheal tube in good position. 2. Indeterminate location of the nasogastric tube which terminates in the mid mediastinum. Cannot exclude endotracheal course of the NG tube. Findings conveyed toREBECCA LORD on 09/04/2014  at12:38.   Electronically Signed   By: Genevive Bi M.D.   On: 09/04/2014 12:38   Dg Chest Port 1 View  09/04/2014   CLINICAL DATA:  Respiratory distress.  EXAM: PORTABLE CHEST - 1 VIEW  COMPARISON:  08/04/2012  FINDINGS: Borderline cardiomegaly. Pulmonary vascularity is within normal limits. Lungs are clear. No pneumothorax. No pleural effusion.  IMPRESSION: Borderline cardiomegaly without decompensation   Electronically Signed   By: Jolaine Click M.D.   On: 09/04/2014 09:43      Indwelling Urinary Catheter continued, requirement due to   Reason to continue Indwelling Urinary Catheter for strict Intake/Output monitoring for hemodynamic instability   Central Line continued, requirement due to   Reason to continue [employer] Monitoring of central venous pressure or other hemodynamic parameters   Ventilator continued, requirement due to, resp failure    Ventilator Sedation RASS 0 to -1   Lines    Cultures Respiratory  Culture Blood Culture   ASSESSMENT/PLAN  69 yo female with PMHx of COPD, OA, HTN, admitted for AECOPD, possible URI  PULMONARY A: AECOPD COPD Respiratory Failure Mechanical ventialtion ? URI P:   - continue with MV, wean as tolerated - check ABG - check CXR in the AM - check sputum culture - continue with Solumedrol  IV  q6hrs for 1-2 days, then transition to PO - Duonebs scheduled Q4hrs, Q2hrs prn wheezing - Sedation/analgesia - precedex/fentanyl - continue with levaquin - VAP bundle - follow up CT Chest   CARDIOVASCULAR P:  - cont to monitor hemodynamics - cont with BP monitoring.  - F\U ECHO  RENAL P:   - ICU electrolyte replacement protocol - avoid nephrotoxic agents  GASTROINTESTINAL P:   - FEN - start tube feeds once NGT is verified - cont PPI for stress ulcer prophylaxis - check KUB for NGT placement   INFECTIOUS A:  URI? P:   - possibly viral URI triggered her COPD - cont with current antibiotics - follow up cultures.   BCx2  UC  Sputum pending Abx: Levaquin, 09/04/14 start date, day 1/7  ENDOCRINE P:   - ICU hypoglycemic\Hyperglycemia protocol   NEUROLOGIC P:   - intubated and sedated - minimal sedation to achieve a RASS goal: -1    I have personally obtained a history, examined the patient, evaluated laboratory and imaging results, formulated the assessment and plan and placed orders.  The Patient requires high complexity decision making for assessment and support, frequent evaluation and titration of therapies, application of advanced monitoring technologies and extensive interpretation of multiple databases. Critical Care Time devoted to patient care services described in this note is 45 minutes.   Overall, patient is critically ill, prognosis is guarded. Patient at high risk for cardiac arrest and death.   Stephanie Acre, MD [facility] Pager [phone] (Please enter [phone]-[phone])     09/04/2014,  1:50 PM

## 2014-09-04 NOTE — H&P (Signed)
[facility] - [place] at [facility]   PATIENT NAME: [name] [name]    MR#:  [id number]  DATE OF BIRTH:  10-18-1945  DATE OF ADMISSION:  09/04/2014  PRIMARY CARE PHYSICIAN: Barbette Reichmann, MD   REQUESTING/REFERRING PHYSICIAN:  Dr. Shaune Pollack  CHIEF COMPLAINT:   Chief Complaint  Patient presents with  . Respiratory Distress    HISTORY OF PRESENT ILLNESS:  Camerin Jimenez  is a 69 y.o. female with a known history of COPD and hypertension who presents from home with respiratory distress. The history is provided by her daughter as the patient is intubated at the time of my examination. Daughter reports that Ms. Mcroberts had been complaining of upper respiratory tract symptoms of runny nose and slight cough for the past 2-3 days. Her granddaughter's had similar symptoms. She did not feel that there were serious did not have any shortness of breath until this morning when she developed acute respiratory distress. Her granddaughter called EMS and on arrival she was wheezing severely. She was started on BiPAP and seemed to do well, but then within an hour she decompensated and required intubation. Her ABG shows a severe acute hypercarbia. Chest x-ray shows cardiomegaly but no pneumonia.  PAST MEDICAL HISTORY:   Past Medical History  Diagnosis Date  . COPD (chronic obstructive pulmonary disease)   . Hypertension     PAST SURGICAL HISTORY:   Past Surgical History  Procedure Laterality Date  . Abdominal hysterectomy      SOCIAL HISTORY:   History  Substance Use Topics  . Smoking status: Former Games developer  . Smokeless tobacco: Not on file  . Alcohol Use: No    FAMILY HISTORY:   Family History  Problem Relation Age of Onset  . Hypertension Mother   . Melanoma Father     DRUG ALLERGIES:  No Known Allergies  REVIEW OF SYSTEMS:   ROS unable to obtain due to patient altered mental status  MEDICATIONS AT HOME:   Prior to Admission medications   Medication Sig Start  Date End Date Taking? Authorizing Provider  ADVAIR DISKUS 500-50 MCG/DOSE AEPB Inhale 1 puff into the lungs every 12 (twelve) hours. 08/09/14  Yes Historical Provider, MD  albuterol (PROVENTIL) (2.5 MG/3ML) 0.083% nebulizer solution Inhale 3 mLs into the lungs every 6 (six) hours as needed. For wheezing 02/15/14  Yes Historical Provider, MD  metoprolol tartrate (LOPRESSOR) 25 MG tablet Take 25 mg by mouth 2 (two) times daily. 08/05/14  Yes Historical Provider, MD  predniSONE (DELTASONE) 5 MG tablet Take 5 mg by mouth daily. 08/19/14  Yes Historical Provider, MD  VENTOLIN HFA 108 (90 BASE) MCG/ACT inhaler Inhale 2 puffs into the lungs every 6 (six) hours as needed. For wheezing 08/30/14  Yes Historical Provider, MD  Vitamin D, Ergocalciferol, (DRISDOL) 50000 UNITS CAPS capsule Take 50,000 Units by mouth once a week. 09/01/14  Yes Historical Provider, MD      VITAL SIGNS:  Blood pressure 120/77, pulse 102, temperature 98.4 F (36.9 C), temperature source Axillary, resp. rate 24, height  (1.651 m), weight 76.658 kg (169 lb), SpO2 99 %.  PHYSICAL EXAMINATION:  GENERAL:  69 y.o.-year-old patient lying in the bed, critically ill, intubated.  EYES: Pupils equal, round, reactive to light and accommodation. No scleral icterus.   HEENT: Head atraumatic, normocephalic. Moist mucosa NECK:  Supple, no jugular venous distention. No thyroid enlargement, no tenderness.  LUNGS: Severe wheezing throughout, poor air movement CARDIOVASCULAR: Distant and difficult to hear over lung sounds, S1,  S2 normal. No murmurs, rubs, or gallops.  ABDOMEN: Soft, nontender, nondistended. Bowel sounds present. No organomegaly or mass.  EXTREMITIES: No pedal edema, cyanosis, or clubbing.  NEUROLOGIC: Sedated PSYCHIATRIC: Sedated  SKIN: No obvious rash, lesion, or ulcer.   LABORATORY PANEL:   CBC  Recent Labs Lab 09/04/14 0853  WBC 9.6  HGB 15.0  HCT 46.7  PLT 203    ------------------------------------------------------------------------------------------------------------------  Chemistries   Recent Labs Lab 09/04/14 0853  NA 138  K 4.8  CL 100*  CO2 29  GLUCOSE 168*  BUN 11  CREATININE 0.61  CALCIUM 8.7*  AST 34  ALT 23  ALKPHOS 85  BILITOT 0.6   ------------------------------------------------------------------------------------------------------------------  Cardiac Enzymes  Recent Labs Lab 09/04/14 0853  TROPONINI <0.03   ------------------------------------------------------------------------------------------------------------------  RADIOLOGY:  Dg Chest Portable 1 View  09/04/2014   CLINICAL DATA:  Intubation  EXAM: PORTABLE CHEST - 1 VIEW  COMPARISON:  08/1914  FINDINGS: Interval intubation with endotracheal tube 5 cm from carina in good position. NG tube terminates in the mid mediastinum and deflected just left of the vertebral column. Cannot exclude endotracheal location of the NG tube.  Lungs are well inflated.  No pneumothorax.  No pulmonary edema.  IMPRESSION: 1. Endotracheal tube in good position. 2. Indeterminate location of the nasogastric tube which terminates in the mid mediastinum. Cannot exclude endotracheal course of the NG tube. Findings conveyed toREBECCA LORD on 09/04/2014  at12:38.   Electronically Signed   By: Genevive Bi M.D.   On: 09/04/2014 12:38   Dg Chest Port 1 View  09/04/2014   CLINICAL DATA:  Respiratory distress.  EXAM: PORTABLE CHEST - 1 VIEW  COMPARISON:  08/04/2012  FINDINGS: Borderline cardiomegaly. Pulmonary vascularity is within normal limits. Lungs are clear. No pneumothorax. No pleural effusion.  IMPRESSION: Borderline cardiomegaly without decompensation   Electronically Signed   By: Jolaine Click M.D.   On: 09/04/2014 09:43    EKG:   Orders placed or performed during the hospital encounter of 09/04/14  . ED EKG  (if patient has PMH of COPD)  . ED EKG  (if patient has PMH of COPD)     IMPRESSION AND PLAN:   Principal Problem:   Acute respiratory failure with hypercapnia Active Problems:   COPD exacerbation  #1 acute respiratory failure with severe hypercapnia:  presenting PCO2 is 91 pH is 7.16. She has been intubated in the emergency room. Pulmonary critical care is following. Repeat ABG in 1 hour is pending. Respiratory failure is due to COPD exacerbation.  # 2 COPD exacerbation: Likely triggered by viral upper respiratory tract infection. Chest x-ray clear for pneumonia. CT angiogram pending to rule out PE. She is not on oxygen at home but is on chronic steroid. Her family believes that she has quit smoking, though they are not entirely sure, primary care note indicates that she may still be smoking cigarettes. Treat with Solu-Medrol 60 mg every 6 hours, DuoNeb's every 4 hours with every 2 hour when necessary, cover with Levaquin in case of infection. Will obtain sputum culture.  #3 cardiomegaly: Will check a 2-D echocardiogram. Chest x-ray not suggestive of congestive heart failure exacerbation. The acuity of her symptoms is concerning. We'll cycle cardiac enzymes as well.  #4 urinary tract infection: UA showing 6-30 white blood cells. We'll get urine culture. Covering with Levaquin.  #5 prophylaxis: Heparin for DVT prophylaxis, PPI  #6 goals of care: I spoke with her daughter who was unclear about the goals of care. Emergency room  physician reports that the granddaughter who I cannot reach at this time has told her that there is paperwork at home. I am not sure if this is a DO NOT RESUSCITATE or a living will. Family is to bring in paperwork.   All the records are reviewed and case discussed with ED provider. Management plans discussed with the patient, family and they are in agreement.  CODE STATUS: Full for now  TOTAL TIME TAKING CARE OF THIS PATIENT: 55 minutes critical care time  Greater than 50% of time spent coordinating care and in counseling family  members regarding patient prognosis and treatment plans.   Elby Showers M.D on 09/04/2014 at 2:29 PM  Between 7am to 6pm - Pager - [phone]  After 6pm go to [email or web address] - password [email or web address] [facility]  [place]  Office  [phone]  CC: Primary care physician; Barbette Reichmann, MD

## 2014-09-05 ENCOUNTER — Inpatient Hospital Stay: Payer: Medicare Other

## 2014-09-05 ENCOUNTER — Inpatient Hospital Stay
Admit: 2014-09-05 | Discharge: 2014-09-05 | Disposition: A | Discharge status: Home / Self Care | Payer: Medicare Other | Attending: Internal Medicine | Admitting: Internal Medicine

## 2014-09-05 DIAGNOSIS — F449 Dissociative and conversion disorder, unspecified: Secondary | ICD-10-CM

## 2014-09-05 DIAGNOSIS — N179 Acute kidney failure, unspecified: Secondary | ICD-10-CM

## 2014-09-05 DIAGNOSIS — I10 Essential (primary) hypertension: Secondary | ICD-10-CM

## 2014-09-05 DIAGNOSIS — Z515 Encounter for palliative care: Secondary | ICD-10-CM

## 2014-09-05 DIAGNOSIS — M818 Other osteoporosis without current pathological fracture: Secondary | ICD-10-CM

## 2014-09-05 DIAGNOSIS — F17210 Nicotine dependence, cigarettes, uncomplicated: Secondary | ICD-10-CM

## 2014-09-05 DIAGNOSIS — J9602 Acute respiratory failure with hypercapnia: Principal | ICD-10-CM

## 2014-09-05 LAB — BASIC METABOLIC PANEL
Anion gap: 5 (ref 5–15)
BUN: 30 mg/dL — AB (ref 6–20)
CHLORIDE: 103 mmol/L (ref 101–111)
CO2: 25 mmol/L (ref 22–32)
CREATININE: 1.36 mg/dL — AB (ref 0.44–1.00)
Calcium: 7.7 mg/dL — ABNORMAL LOW (ref 8.9–10.3)
GFR calc non Af Amer: 39 mL/min — ABNORMAL LOW (ref >60)
GFR, EST AFRICAN AMERICAN: 45 mL/min — AB (ref >60)
GLUCOSE: 210 mg/dL — AB (ref 65–99)
Potassium: 3.8 mmol/L (ref 3.5–5.1)
Sodium: 133 mmol/L — ABNORMAL LOW (ref 135–145)

## 2014-09-05 LAB — CBC
HEMATOCRIT: 39.8 % (ref 35.0–47.0)
HEMOGLOBIN: 12.9 g/dL (ref 12.0–16.0)
MCH: 29.5 pg (ref 26.0–34.0)
MCHC: 32.4 g/dL (ref 32.0–36.0)
MCV: 91.2 fL (ref 80.0–100.0)
Platelets: 177 10*3/uL (ref 150–440)
RBC: 4.36 MIL/uL (ref 3.80–5.20)
RDW: 14 % (ref 11.5–14.5)
WBC: 6.2 10*3/uL (ref 3.6–11.0)

## 2014-09-05 LAB — EXPECTORATED SPUTUM ASSESSMENT W REFEX TO RESP CULTURE

## 2014-09-05 LAB — GLUCOSE, CAPILLARY: GLUCOSE-CAPILLARY: 171 mg/dL — AB (ref 65–99)

## 2014-09-05 LAB — EXPECTORATED SPUTUM ASSESSMENT W GRAM STAIN, RFLX TO RESP C

## 2014-09-05 LAB — TROPONIN I: TROPONIN I: 0.27 ng/mL — AB (ref <0.031)

## 2014-09-05 MED ORDER — BUDESONIDE 0.5 MG/2ML IN SUSP
0.5000 mg | Freq: Two times a day (BID) | RESPIRATORY_TRACT | Status: DC
  Administered 2014-09-05 – 2014-09-09 (×10): 0.5 mg via RESPIRATORY_TRACT
  Filled 2014-09-05 (×10): qty 2

## 2014-09-05 MED ORDER — FAMOTIDINE 20 MG PO TABS
20.0000 mg | ORAL_TABLET | ORAL | Status: DC
  Administered 2014-09-05: 20 mg via ORAL
  Filled 2014-09-05: qty 1

## 2014-09-05 MED ORDER — FREE WATER
25.0000 mL | Status: DC
  Administered 2014-09-05 – 2014-09-07 (×11): 25 mL

## 2014-09-05 MED ORDER — VITAL HIGH PROTEIN PO LIQD: 1000.0000 mL | ORAL | Status: DC

## 2014-09-05 MED ORDER — VITAL 1.5 CAL PO LIQD
1000.0000 mL | ORAL | Status: DC
  Administered 2014-09-05 – 2014-09-06 (×2): 1000 mL

## 2014-09-05 MED ORDER — LEVOFLOXACIN IN D5W 250 MG/50ML IV SOLN
250.0000 mg | INTRAVENOUS | Status: DC
  Filled 2014-09-05: qty 50

## 2014-09-05 MED ORDER — CEFTRIAXONE SODIUM IN DEXTROSE 20 MG/ML IV SOLN
1.0000 g | INTRAVENOUS | Status: DC
  Administered 2014-09-05: 1 g via INTRAVENOUS
  Filled 2014-09-05: qty 50

## 2014-09-05 MED ORDER — SENNA 8.6 MG PO TABS
1.0000 | ORAL_TABLET | Freq: Two times a day (BID) | ORAL | Status: DC
  Administered 2014-09-05 (×2): 8.6 mg via ORAL
  Filled 2014-09-05 (×2): qty 1

## 2014-09-05 MED ORDER — CEFTRIAXONE SODIUM IN DEXTROSE 20 MG/ML IV SOLN
1.0000 g | Freq: Once | INTRAVENOUS | Status: AC
  Administered 2014-09-05: 1 g via INTRAVENOUS
  Filled 2014-09-05: qty 50

## 2014-09-05 MED ORDER — CEFTRIAXONE SODIUM IN DEXTROSE 40 MG/ML IV SOLN
2.0000 g | INTRAVENOUS | Status: DC
  Administered 2014-09-06: 2 g via INTRAVENOUS
  Filled 2014-09-05 (×2): qty 50

## 2014-09-05 MED ORDER — DOCUSATE SODIUM 50 MG/5ML PO LIQD
50.0000 mg | Freq: Two times a day (BID) | ORAL | Status: DC
  Administered 2014-09-05 (×2): 50 mg via ORAL
  Filled 2014-09-05 (×2): qty 10

## 2014-09-05 MED ORDER — METHYLPREDNISOLONE SODIUM SUCC 125 MG IJ SOLR
60.0000 mg | Freq: Every day | INTRAMUSCULAR | Status: DC
  Administered 2014-09-06 – 2014-09-08 (×3): 60 mg via INTRAVENOUS
  Filled 2014-09-05 (×3): qty 2

## 2014-09-05 MED ORDER — DEXTROSE 5 % IV SOLN
500.0000 mg | INTRAVENOUS | Status: DC
  Administered 2014-09-05 – 2014-09-06 (×2): 500 mg via INTRAVENOUS
  Filled 2014-09-05 (×3): qty 500

## 2014-09-05 MED ORDER — PANTOPRAZOLE SODIUM 40 MG PO PACK: 40.0000 mg | PACK | ORAL | Status: DC

## 2014-09-05 MED ORDER — PRO-STAT SUGAR FREE PO LIQD
30.0000 mL | Freq: Three times a day (TID) | ORAL | Status: DC
  Administered 2014-09-05 – 2014-09-06 (×6): 30 mL via ORAL

## 2014-09-05 NOTE — Progress Notes (Signed)
PULMONARY / CRITICAL CARE MEDICINE   Name: [name] [name] MRN: [id number] DOB: 1946/03/03    ADMISSION DATE:  09/04/2014 CONSULTATION DATE:  09/04/14  REFERRING MD :  Dr. Clent Ridges    Subjective   Patient intubated,sedated, failed SAT/SBt due to severe wheezing,daughter at bedside updated Placed back on full  Vent support        SIGNIFICANT EVENTS  6/19 - intubated for hypercapenic respiratory failure in [facility] ED      PAST MEDICAL HISTORY    :  Past Medical History  Diagnosis Date  . COPD (chronic obstructive pulmonary disease)   . Hypertension    Past Surgical History  Procedure Laterality Date  . Abdominal hysterectomy     Prior to Admission medications   Medication Sig Start Date End Date Taking? Authorizing Provider  ADVAIR DISKUS 500-50 MCG/DOSE AEPB Inhale 1 puff into the lungs every 12 (twelve) hours. 08/09/14  Yes Historical Provider, MD  albuterol (PROVENTIL) (2.5 MG/3ML) 0.083% nebulizer solution Inhale 3 mLs into the lungs every 6 (six) hours as needed. For wheezing 02/15/14  Yes Historical Provider, MD  metoprolol tartrate (LOPRESSOR) 25 MG tablet Take 25 mg by mouth 2 (two) times daily. 08/05/14  Yes Historical Provider, MD  predniSONE (DELTASONE) 5 MG tablet Take 5 mg by mouth daily. 08/19/14  Yes Historical Provider, MD  VENTOLIN HFA 108 (90 BASE) MCG/ACT inhaler Inhale 2 puffs into the lungs every 6 (six) hours as needed. For wheezing 08/30/14  Yes Historical Provider, MD  Vitamin D, Ergocalciferol, (DRISDOL) 50000 UNITS CAPS capsule Take 50,000 Units by mouth once a week. 09/01/14  Yes Historical Provider, MD   No Known Allergies   FAMILY HISTORY   Family History  Problem Relation Age of Onset  . Hypertension Mother   . Melanoma Father       SOCIAL HISTORY    reports that she has quit smoking. She does not have any smokeless tobacco history on file. She reports that she does not drink alcohol. Her drug history is not on file.  Review of Systems   Unable to perform ROS: critical illness   - unable to obtain, patient intubated and sedated    VITAL SIGNS    Temp:  [98.2 F (36.8 C)-99.6 F (37.6 C)] 98.2 F (36.8 C) (06/20 0800) Pulse Rate:  [98-124] 109 (06/20 0946) Resp:  [16-35] 25 (06/20 0900) BP: (51-146)/(35-90) 91/62 mmHg (06/20 0946) SpO2:  [91 %-100 %] 94 % (06/20 0900) FiO2 (%):  [28 %-35 %] 28 % (06/20 0744) Weight:  [159 lb 2.8 oz (72.2 kg)] 159 lb 2.8 oz (72.2 kg) (06/19 1433) HEMODYNAMICS:   VENTILATOR SETTINGS: Vent Mode:  [-] PRVC FiO2 (%):  [28 %-35 %] 28 % Set Rate:  [24 bmp] 24 bmp Vt Set:  [450 mL-500 mL] 500 mL PEEP:  [5 cmH20] 5 cmH20 INTAKE / OUTPUT:  Intake/Output Summary (Last 24 hours) at 09/05/14 1033 Last data filed at 09/05/14 1021  Gross per 24 hour  Intake 501.72 ml  Output    575 ml  Net -73.28 ml       PHYSICAL EXAM   Physical Exam  Constitutional: She appears distressed.  HENT:  Head: Normocephalic and atraumatic.  Eyes: Conjunctivae are normal. Pupils are equal, round, and reactive to light.  Cardiovascular:  No murmur heard. Pulmonary/Chest: She is in respiratory distress. She has wheezes. She has rales.  Abdominal: Soft. Bowel sounds are normal.  Musculoskeletal: Normal range of motion.  Neurological:  GCS<8T  LABS   LABS:  CBC  Recent Labs Lab 09/04/14 0853 09/05/14 0421  WBC 9.6 6.2  HGB 15.0 12.9  HCT 46.7 39.8  PLT 203 177   Coag's No results for input(s): APTT, INR in the last 168 hours. BMET  Recent Labs Lab 09/04/14 0853 09/05/14 0421  NA 138 133*  K 4.8 3.8  CL 100* 103  CO2 29 25  BUN 11 30*  CREATININE 0.61 1.36*  GLUCOSE 168* 210*   Electrolytes  Recent Labs Lab 09/04/14 0853 09/05/14 0421  CALCIUM 8.7* 7.7*   Sepsis Markers No results for input(s): LATICACIDVEN, PROCALCITON, O2SATVEN in the last 168 hours. ABG  Recent Labs Lab 09/04/14 1520 09/04/14 2020  PHART 7.28* 7.31*  PCO2ART 57* 49*  PO2ART 104  59*   Liver Enzymes  Recent Labs Lab 09/04/14 0853  AST 34  ALT 23  ALKPHOS 85  BILITOT 0.6  ALBUMIN 4.0   Cardiac Enzymes  Recent Labs Lab 09/04/14 0853 09/04/14 1938 09/05/14 0046  TROPONINI <0.03 0.38* 0.27*   Glucose  Recent Labs Lab 09/04/14 1448  GLUCAP 143*     Recent Results (from the past 240 hour(s))  Culture, blood (routine x 2)     Status: None (Preliminary result)   Collection Time: 09/04/14  8:53 AM  Result Value Ref Range Status   Specimen Description BLOOD RIGHT HAND  Final   Special Requests Normal  Final   Culture NO GROWTH < 24 HOURS  Final   Report Status PENDING  Incomplete  Culture, blood (routine x 2)     Status: None (Preliminary result)   Collection Time: 09/04/14  9:17 AM  Result Value Ref Range Status   Specimen Description BLOOD RIGHT ARM  Final   Special Requests Normal  Final   Culture NO GROWTH < 24 HOURS  Final   Report Status PENDING  Incomplete  Urine culture     Status: None (Preliminary result)   Collection Time: 09/04/14  1:41 PM  Result Value Ref Range Status   Specimen Description URINE, CLEAN CATCH  Final   Special Requests Normal  Final   Culture NO GROWTH < 24 HOURS  Final   Report Status PENDING  Incomplete  MRSA PCR Screening     Status: None   Collection Time: 09/04/14  5:50 PM  Result Value Ref Range Status   MRSA by PCR NEGATIVE NEGATIVE Final    Comment:        The GeneXpert MRSA Assay (FDA approved for NASAL specimens only), is one component of a comprehensive MRSA colonization surveillance program. It is not intended to diagnose MRSA infection nor to guide or monitor treatment for MRSA infections.   Culture, expectorated sputum-assessment     Status: None (Preliminary result)   Collection Time: 09/05/14  4:21 AM  Result Value Ref Range Status   Specimen Description ENDOTRACHEAL  Final   Special Requests NONE  Final   Sputum evaluation THIS SPECIMEN IS ACCEPTABLE FOR SPUTUM CULTURE  Final    Report Status PENDING  Incomplete     Current facility-administered medications:  .  antiseptic oral rinse (CPC / CETYLPYRIDINIUM CHLORIDE 0.05%) solution 7 mL, 7 mL, Mouth Rinse, QID, Vishal Mungal, MD, 7 mL at 09/05/14 0400 .  aspirin chewable tablet 81 mg, 81 mg, Oral, Daily, Leotis Shames, MD, 81 mg at 09/05/14 0939 .  chlorhexidine (PERIDEX) 0.12 % solution 15 mL, 15 mL, Mouth Rinse, BID, Vishal Mungal, MD, 15 mL at 09/05/14 0800 .  dexmedetomidine (PRECEDEX) 400 MCG/100ML (4 mcg/mL) infusion, 0.4-1.2 mcg/kg/hr, Intravenous, Titrated, Vishal Mungal, MD, 0.4 mcg/kg/hr at 09/04/14 1602 .  etomidate (AMIDATE) injection, , Intravenous, PRN, Governor Rooks, MD, 20 mg at 09/04/14 1158 .  fentaNYL in NS (42mcg/ml) infusion-PREMIX, 10 mcg/hr, Intravenous, Continuous, Vishal Mungal, MD, Last Rate: 15 mL/hr at 09/05/14 1021, 150 mcg/hr at 09/05/14 1021 .  heparin injection 5,000 Units, 5,000 Units, Subcutaneous, 3 times per day, Gale Journey, MD, 5,000 Units at 09/05/14 [phone] .  ipratropium-albuterol (DUONEB) 0.5-2.5 (3) MG/3ML nebulizer solution 3 mL, 3 mL, Nebulization, Q4H, Gale Journey, MD, 3 mL at 09/05/14 0740 .  Levofloxacin (LEVAQUIN) IVPB 250 mg, 250 mg, Intravenous, Q24H, Bertram Savin, RPH .  methylPREDNISolone sodium succinate (SOLU-MEDROL) 125 mg/2 mL injection 60 mg, 60 mg, Intravenous, Q6H, Gale Journey, MD, 60 mg at 09/05/14 0609 .  metoprolol tartrate (LOPRESSOR) tablet 25 mg, 25 mg, Oral, BID, Gale Journey, MD, 25 mg at 09/04/14 2358 .  norepinephrine (LEVOPHED)  in D5W premix infusion, 0-40 mcg/min, Intravenous, Titrated, Jaynie Crumble, MD, Stopped at 09/05/14 9604 .  pantoprazole sodium (PROTONIX) 40 mg/20 mL oral suspension 40 mg, 40 mg, Per Tube, Q24H, Bertram Savin, RPH .  pneumococcal 23 valent vaccine (PNU-IMMUNE) injection 0.5 mL, 0.5 mL, Intramuscular, Tomorrow-1000, Gale Journey, MD, 0.5 mL at 09/05/14 0946 .   succinylcholine (ANECTINE) injection, , Intravenous, PRN, Governor Rooks, MD, 120 mg at 09/04/14 1202  IMAGING    Dg Abd 1 View  09/04/2014   CLINICAL DATA:  69 year old female with a history of new orogastric tube placement.  EXAM: ABDOMEN - 1 VIEW  COMPARISON:  CT 09/04/2014, plain film 09/04/2014  FINDINGS: Limited plain film of the abdomen.  Interval placement of gastric tube, with the side port terminating beyond the gastroesophageal junction.  IMPRESSION: Interval placement of gastric tube on this limited study, with the side port beyond the gastroesophageal junction.  Signed,  Yvone Neu. Loreta Ave, DO  [facility]  [facility]   Electronically Signed   By: Gilmer Mor D.O.   On: 09/04/2014 17:24   Ct Angio Chest Pe W/cm &/or Wo Cm  09/04/2014   CLINICAL DATA:  Respiratory distress for 2 days  EXAM: CT ANGIOGRAPHY CHEST WITH CONTRAST  TECHNIQUE: Multidetector CT imaging of the chest was performed using the standard protocol during bolus administration of intravenous contrast. Multiplanar CT image reconstructions and MIPs were obtained to evaluate the vascular anatomy.  CONTRAST:  75mL OMNIPAQUE IOHEXOL 350 MG/ML SOLN  COMPARISON:  None.  FINDINGS: No filling defect in the pulmonary arterial tree to suggest acute pulmonary thromboembolism.  No abnormal mediastinal adenopathy. Coronary artery calcifications are noted. No pericardial effusion. Endotracheal and NG tubes are present. Atherosclerotic changes in the proximal left subclavian artery are present. There is gross patency of the great vessels including the vertebral arteries within the confines of the examination. No evidence of aortic dissection. Maximal diameter of the ascending aorta is 4.0 cm.  No pneumothorax.  No pleural effusion.  Severe emphysema with a prep collection for the upper lungs. Bilateral ill-defined centrilobular nodules and ground-glass opacities are present. The largest abnormality in  the right upper lobe on image 79 measures 7 mm. Bibasilar bronchiectasis.  No vertebral compression deformity.  Review of the MIP images confirms the above findings.  IMPRESSION: No evidence of acute pulmonary thromboembolism.  Bilateral tiny ill-defined nodules and ground-glass opacities most likely due to an inflammatory process. Three-month  follow-up CT is recommended to ensure resolution of these findings. Initial follow-up by chest CT without contrast is recommended in 3 months to confirm persistence. This recommendation follows the consensus statement: Recommendations for the Management of Subsolid Pulmonary Nodules Detected at CT: A Statement from the Fleischner Society as published in Radiology 2013; 266:304-317.  Emphysema.  Endotracheal and NG tubes.  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 Recommend semi-annual imaging followup by CTA or MRA and referral to cardiothoracic surgery if not already obtained. This recommendation follows 2010 ACCF/AHA/AATS/ACR/ASA/SCA/SCAI/SIR/STS/SVM Guidelines for the Diagnosis and Management of Patients With Thoracic Aortic Disease. Circulation. 2010; 121: e266-e36   Electronically Signed   By: Jolaine Click M.D.   On: 09/04/2014 14:41   Dg Chest Port 1 View  09/05/2014   CLINICAL DATA:  Respiratory failure.  EXAM: PORTABLE CHEST - 1 VIEW  COMPARISON:  09/04/2014.  FINDINGS: Endotracheal tube, right PICC line, NG tube in stable position. Heart size normal. Lungs are clear. No pleural effusion or pneumothorax.  IMPRESSION: 1. Lines and tubes in stable position. 2. No acute cardiopulmonary disease identified. Chest appears stable from prior exam.   Electronically Signed   By: Maisie Fus  Register   On: 09/05/2014 07:35   Dg Chest Port 1 View  09/05/2014   CLINICAL DATA:  PICC placement.  Initial encounter.  EXAM: PORTABLE CHEST - 1 VIEW  COMPARISON:  Chest radiograph and CTA of the chest performed earlier today at 12:19 p.m., and at 2:07 p.m.  FINDINGS:  The patient's endotracheal tube is seen ending 2-3 cm above the carina. An enteric tube is noted extending below the diaphragm.  The right PICC is noted ending about the mid SVC.  The lungs are well-aerated and clear. There is no evidence of focal opacification, pleural effusion or pneumothorax.  The cardiomediastinal silhouette is within normal limits. No acute osseous abnormalities are seen.  IMPRESSION: 1. Right PICC noted ending about the mid SVC. 2. No acute cardiopulmonary process seen.   Electronically Signed   By: Roanna Raider M.D.   On: 09/05/2014 00:06   Dg Chest Portable 1 View  09/04/2014   CLINICAL DATA:  Intubation  EXAM: PORTABLE CHEST - 1 VIEW  COMPARISON:  08/1914  FINDINGS: Interval intubation with endotracheal tube 5 cm from carina in good position. NG tube terminates in the mid mediastinum and deflected just left of the vertebral column. Cannot exclude endotracheal location of the NG tube.  Lungs are well inflated.  No pneumothorax.  No pulmonary edema.  IMPRESSION: 1. Endotracheal tube in good position. 2. Indeterminate location of the nasogastric tube which terminates in the mid mediastinum. Cannot exclude endotracheal course of the NG tube. Findings conveyed toREBECCA LORD on 09/04/2014  at12:38.   Electronically Signed   By: Genevive Bi M.D.   On: 09/04/2014 12:38      Indwelling Urinary Catheter continued, requirement due to   Reason to continue Indwelling Urinary Catheter for strict Intake/Output monitoring for hemodynamic instability   Central Line continued, requirement due to   Reason to continue [employer] Monitoring of central venous pressure or other hemodynamic parameters   Ventilator continued, requirement due to, resp failure    Ventilator Sedation RASS 0 to -1   Lines       ASSESSMENT/PLAN  68 yo female with PMHx of COPD, OA, HTN, admitted for acute resp failure from acute COPD exacerbation lieklu from acute bronchitis/atypical  pneumonia.  PULMONARY A: AECOPD COPD Respiratory Failure Mechanical ventialtion P:   -  continue with MV, wean as tolerated - check ABG/CXR as needed - VAP bundle   CARDIOVASCULAR P:  - cont to monitor hemodynamics - cont with BP monitoring.  - F\U ECHO  RENAL P:   - ICU electrolyte replacement protocol - avoid nephrotoxic agents  GASTROINTESTINAL P:   - FEN - start tube feeds - cont PPI for stress ulcer prophylaxis  INFECTIOUS A:  Atypical pneumonia P:   - cont with current antibiotics - follow up cultures.  BCx2  -Sputum pending  ENDOCRINE P:   - ICU hypoglycemic\Hyperglycemia protocol  NEUROLOGIC P:   - intubated and sedated - minimal sedation to achieve a RASS goal: -1    I have personally obtained a history, examined the patient, evaluated laboratory and imaging results, formulated the assessment and plan and placed orders.  The Patient requires high complexity decision making for assessment and support, frequent evaluation and titration of therapies, application of advanced monitoring technologies and extensive interpretation of multiple databases. Critical Care Time devoted to patient care services described in this note is 45 minutes.   Overall, patient is critically ill, prognosis is guarded. Patient at high risk for cardiac arrest and death.       2014/09/26, 10:33 AM

## 2014-09-05 NOTE — Progress Notes (Signed)
Initial Nutrition Assessment    INTERVENTION:  Coordination of Care: discussed nutritional poc during ICU rounds, received verbal order from MD Kasa to initiate the adult tube feeding protocol  EN: recommend Vital 1.5 TF at goal rate of 41 ml/hr with Prostat TID with 112 g of protein, 1776 kcals, 989 mL of free water estimated. Recommend starting free water flush 25 mL q 4 hours (additional 150 mL) as pt receiving additional IVF. Will continue to assess  NUTRITION DIAGNOSIS:  Inadequate oral intake related to acute illness as evidenced by NPO status.   GOAL:  Provide needs based on ASPEN/SCCM guidelines EN: tolerance  MONITOR:   (Energy Intake, Pulmonary, EN, Digestive System, Electrolyte/Renal PRofile, Glucose Profile)  REASON FOR ASSESSMENT:  Consult, Ventilator Enteral/tube feeding initiation and management  ASSESSMENT:  Pt admitted with acute respiratory failure due to COPD exacerbation, currently sedated on vent, failed weaning trial this AM  PMHx:  Past Medical History  Diagnosis Date  . COPD (chronic obstructive pulmonary disease)   . Hypertension      Current Nutrition: NPO  Food/Nutrition-Related History: unable to assess  Digestive System: OG in place  Medications: solumedrol, fentanyl, levophed  Electrolyte/Renal Profile and Glucose Profile:   Recent Labs Lab 09/04/14 0853 09/05/14 0421  NA 138 133*  K 4.8 3.8  CL 100* 103  CO2 29 25  BUN 11 30*  CREATININE 0.61 1.36*  CALCIUM 8.7* 7.7*  GLUCOSE 168* 210*   Protein Profile:   Recent Labs Lab 09/04/14 0853  ALBUMIN 4.0      Nutrition-Focused Physical Exam Findings:  Unable to complete Nutrition-Focused physical exam at this time.    Height:  Ht Readings from Last 1 Encounters:  09/04/14 5\' 5"  (1.651 m)    Weight:  Wt Readings from Last 1 Encounters:  09/04/14 159 lb 2.8 oz (72.2 kg)    Filed Weights   09/04/14 0853 09/04/14 1433  Weight: 169 lb (76.658 kg) 159 lb 2.8  oz (72.2 kg)     BMI:  Body mass index is 26.49 kg/(m^2).  Estimated Nutritional Needs:  Kcal:  1792 kcals (BEE 1409, Ve: 12, Tmax: 37.6) using current wt of 72.2 kg  Protein:  108-144 g(1.5-2.0 g/kg)   Fluid:  1800-2160 mL (25-30 ml/kg)   Skin:   no pressure ulcer noted  Diet Order:  Diet NPO time specified  EDUCATION NEEDS:  No education needs identified at this time   Intake/Output Summary (Last 24 hours) at 09/05/14 1113 Last data filed at 09/05/14 1021  Gross per 24 hour  Intake 501.72 ml  Output    575 ml  Net -73.28 ml   HIGH Care Level  Romelle Starcher MS, RD, LDN [phone] Pager

## 2014-09-05 NOTE — Consult Note (Signed)
Follow up visit made. Pt reportedly failed SBT this AM. Now awake, nods head appropriately to questions. Daughter here earlier but not present now. Will follow up in AM.

## 2014-09-05 NOTE — Progress Notes (Signed)
Bath completed, patient light sedated with fentanyl, able to follow commands.

## 2014-09-05 NOTE — Progress Notes (Signed)
Spoke to Dr. Dareen Piano- mentioned the positive sputum culture of gram positive cocci in pairs and chains-pt on azithromycin and rocephin q24hr- no new orders.

## 2014-09-05 NOTE — Progress Notes (Signed)
PROGRESS NOTE  [name] [name] ZOX:[id number] DOB: [birth year] DOA: 09/04/2014 PCP: Barbette Reichmann, MD  Subjective:  69 y/o f  With hx of COPD, HTN admitted with Acute Hypercapnic  Respiratory failure following episode of URI.[name] Also noted to have UTI CT Chest: Negative for PE This am intubated, awake  And responding to commands    Consultants:  Dr. Dema Severin   Objective: BP 114/68 mmHg  Pulse 110  Temp(Src) 98.2 F (36.8 C) (Axillary)  Resp 24  Ht  (1.651 m)  Wt 72.2 kg (159 lb 2.8 oz)  BMI 26.49 kg/m2  SpO2 94%  Intake/Output Summary (Last 24 hours) at 09/05/14 0823 Last data filed at 09/05/14 0620  Gross per 24 hour  Intake 413.53 ml  Output    575 ml  Net -161.47 ml   Filed Weights   09/04/14 0853 09/04/14 1433  Weight: 76.658 kg (169 lb) 72.2 kg (159 lb 2.8 oz)    Exam: Critically ill  On vent   General:  Intubated   Cardiovascular: S1 S2  Respiratory: Clear to auscultation  Abdomen: Soft   Neuro:Non Focal   Data Reviewed: Basic Metabolic Panel:  Recent Labs Lab 09/04/14 0853 09/05/14 0421  NA 138 133*  K 4.8 3.8  CL 100* 103  CO2 29 25  GLUCOSE 168* 210*  BUN 11 30*  CREATININE 0.61 1.36*  CALCIUM 8.7* 7.7*   Liver Function Tests:  Recent Labs Lab 09/04/14 0853  AST 34  ALT 23  ALKPHOS 85  BILITOT 0.6  PROT 7.8  ALBUMIN 4.0   No results for input(s): LIPASE, AMYLASE in the last 168 hours. No results for input(s): AMMONIA in the last 168 hours. CBC:  Recent Labs Lab 09/04/14 0853 09/05/14 0421  WBC 9.6 6.2  HGB 15.0 12.9  HCT 46.7 39.8  MCV 91.9 91.2  PLT 203 177   Cardiac Enzymes:    Recent Labs Lab 09/04/14 0853 09/04/14 1938 09/05/14 0046  TROPONINI <0.03 0.38* 0.27*   BNP (last 3 results) No results for input(s): BNP in the last 8760 hours.  ProBNP (last 3 results) No results for input(s): PROBNP in the last 8760 hours.  CBG:  Recent Labs Lab 09/04/14 1448  GLUCAP 143*    Recent Results  (from the past 240 hour(s))  MRSA PCR Screening     Status: None   Collection Time: 09/04/14  5:50 PM  Result Value Ref Range Status   MRSA by PCR NEGATIVE NEGATIVE Final    Comment:        The GeneXpert MRSA Assay (FDA approved for NASAL specimens only), is one component of a comprehensive MRSA colonization surveillance program. It is not intended to diagnose MRSA infection nor to guide or monitor treatment for MRSA infections.   Culture, expectorated sputum-assessment     Status: None (Preliminary result)   Collection Time: 09/05/14  4:21 AM  Result Value Ref Range Status   Specimen Description ENDOTRACHEAL  Final   Special Requests NONE  Final   Sputum evaluation THIS SPECIMEN IS ACCEPTABLE FOR SPUTUM CULTURE  Final   Report Status PENDING  Incomplete     Studies: Dg Abd 1 View  09/04/2014   CLINICAL DATA:  69 year old female with a history of new orogastric tube placement.  EXAM: ABDOMEN - 1 VIEW  COMPARISON:  CT 09/04/2014, plain film 09/04/2014  FINDINGS: Limited plain film of the abdomen.  Interval placement of gastric tube, with the side port terminating beyond the gastroesophageal junction.  IMPRESSION:  Interval placement of gastric tube on this limited study, with the side port beyond the gastroesophageal junction.  Signed,  Yvone Neu. Loreta Ave, DO  [facility]  [facility]   Electronically Signed   By: Gilmer Mor D.O.   On: 09/04/2014 17:24   Ct Angio Chest Pe W/cm &/or Wo Cm  09/04/2014   CLINICAL DATA:  Respiratory distress for 2 days  EXAM: CT ANGIOGRAPHY CHEST WITH CONTRAST  TECHNIQUE: Multidetector CT imaging of the chest was performed using the standard protocol during bolus administration of intravenous contrast. Multiplanar CT image reconstructions and MIPs were obtained to evaluate the vascular anatomy.  CONTRAST:  59mL OMNIPAQUE IOHEXOL 350 MG/ML SOLN  COMPARISON:  None.  FINDINGS: No filling defect in the pulmonary  arterial tree to suggest acute pulmonary thromboembolism.  No abnormal mediastinal adenopathy. Coronary artery calcifications are noted. No pericardial effusion. Endotracheal and NG tubes are present. Atherosclerotic changes in the proximal left subclavian artery are present. There is gross patency of the great vessels including the vertebral arteries within the confines of the examination. No evidence of aortic dissection. Maximal diameter of the ascending aorta is 4.0 cm.  No pneumothorax.  No pleural effusion.  Severe emphysema with a prep collection for the upper lungs. Bilateral ill-defined centrilobular nodules and ground-glass opacities are present. The largest abnormality in the right upper lobe on image 79 measures 7 mm. Bibasilar bronchiectasis.  No vertebral compression deformity.  Review of the MIP images confirms the above findings.  IMPRESSION: No evidence of acute pulmonary thromboembolism.  Bilateral tiny ill-defined nodules and ground-glass opacities most likely due to an inflammatory process. Three-month follow-up CT is recommended to ensure resolution of these findings. Initial follow-up by chest CT without contrast is recommended in 3 months to confirm persistence. This recommendation follows the consensus statement: Recommendations for the Management of Subsolid Pulmonary Nodules Detected at CT: A Statement from the Fleischner Society as published in Radiology 2013; 266:304-317.  Emphysema.  Endotracheal and NG tubes.  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 Recommend semi-annual imaging followup by CTA or MRA and referral to cardiothoracic surgery if not already obtained. This recommendation follows 2010 ACCF/AHA/AATS/ACR/ASA/SCA/SCAI/SIR/STS/SVM Guidelines for the Diagnosis and Management of Patients With Thoracic Aortic Disease. Circulation. 2010; 121: e266-e36   Electronically Signed   By: Jolaine Click M.D.   On: 09/04/2014 14:41   Dg Chest Port 1 View  09/05/2014    CLINICAL DATA:  Respiratory failure.  EXAM: PORTABLE CHEST - 1 VIEW  COMPARISON:  09/04/2014.  FINDINGS: Endotracheal tube, right PICC line, NG tube in stable position. Heart size normal. Lungs are clear. No pleural effusion or pneumothorax.  IMPRESSION: 1. Lines and tubes in stable position. 2. No acute cardiopulmonary disease identified. Chest appears stable from prior exam.   Electronically Signed   By: Maisie Fus  Register   On: 09/05/2014 07:35   Dg Chest Port 1 View  09/05/2014   CLINICAL DATA:  PICC placement.  Initial encounter.  EXAM: PORTABLE CHEST - 1 VIEW  COMPARISON:  Chest radiograph and CTA of the chest performed earlier today at 12:19 p.m., and at 2:07 p.m.  FINDINGS: The patient's endotracheal tube is seen ending 2-3 cm above the carina. An enteric tube is noted extending below the diaphragm.  The right PICC is noted ending about the mid SVC.  The lungs are well-aerated and clear. There is no evidence of focal opacification, pleural effusion or pneumothorax.  The cardiomediastinal silhouette is within  normal limits. No acute osseous abnormalities are seen.  IMPRESSION: 1. Right PICC noted ending about the mid SVC. 2. No acute cardiopulmonary process seen.   Electronically Signed   By: Roanna Raider M.D.   On: 09/05/2014 00:06   Dg Chest Portable 1 View  09/04/2014   CLINICAL DATA:  Intubation  EXAM: PORTABLE CHEST - 1 VIEW  COMPARISON:  08/1914  FINDINGS: Interval intubation with endotracheal tube 5 cm from carina in good position. NG tube terminates in the mid mediastinum and deflected just left of the vertebral column. Cannot exclude endotracheal location of the NG tube.  Lungs are well inflated.  No pneumothorax.  No pulmonary edema.  IMPRESSION: 1. Endotracheal tube in good position. 2. Indeterminate location of the nasogastric tube which terminates in the mid mediastinum. Cannot exclude endotracheal course of the NG tube. Findings conveyed toREBECCA LORD on 09/04/2014  at12:38.    Electronically Signed   By: Genevive Bi M.D.   On: 09/04/2014 12:38   Dg Chest Port 1 View  09/04/2014   CLINICAL DATA:  Respiratory distress.  EXAM: PORTABLE CHEST - 1 VIEW  COMPARISON:  08/04/2012  FINDINGS: Borderline cardiomegaly. Pulmonary vascularity is within normal limits. Lungs are clear. No pneumothorax. No pleural effusion.  IMPRESSION: Borderline cardiomegaly without decompensation   Electronically Signed   By: Jolaine Click M.D.   On: 09/04/2014 09:43    Scheduled Meds: . antiseptic oral rinse  7 mL Mouth Rinse QID  . aspirin  81 mg Oral Daily  . chlorhexidine  15 mL Mouth Rinse BID  . heparin  5,000 Units Subcutaneous 3 times per day  . ipratropium-albuterol  3 mL Nebulization Q4H  . levofloxacin (LEVAQUIN) IV  500 mg Intravenous Q24H  . methylPREDNISolone (SOLU-MEDROL) injection  60 mg Intravenous Q6H  . metoprolol tartrate  25 mg Oral BID  . pantoprazole (PROTONIX) IV  40 mg Intravenous QHS  . pneumococcal 23 valent vaccine  0.5 mL Intramuscular Tomorrow-1000    Continuous Infusions: . dexmedetomidine    . fentaNYL infusion INTRAVENOUS 125 mcg/hr (09/05/14 0800)  . norepinephrine 1 mcg/min (09/05/14 0800)    Assessment/Plan: Principal Problem: 1 Acute  Hypercapnic respiratory failure secondary to COPD exacerbation ABG today Continue SVN's and IV Solumedrol Weaning trial  today. May be able to extubate  2 UTI; On Levaquin 3 HTN: On Metoprolol 4 Elevated Troponin: probable Demand ischemia   Code Status: Full        Johnny Latu   09/05/2014, 8:23 AM  LOS: 1 day

## 2014-09-05 NOTE — Progress Notes (Addendum)
Inpatient Diabetes Program Recommendations  AACE/ADA: New Consensus Statement on Inpatient Glycemic Control (2013)  Target Ranges:  Prepandial:   less than 140 mg/dL      Peak postprandial:   less than 180 mg/dL (1-2 hours)      Critically ill patients:  140 - 180 mg/dL     Results for JAHNIECE, OSHANA (MRN [id number]) as of 09/05/2014 09:55  Ref. Range 09/04/2014 08:53 09/05/2014 04:21  Glucose Latest Ref Range: 65-99 mg/dL 353 (H) 299 (H)    Results for ASHLEIGH, SCHOENECK (MRN [id number]) as of 09/05/2014 09:55  Ref. Range 09/04/2014 08:53  Hemoglobin A1C Latest Ref Range: 4.0-6.0 % 6.0     Admit with: SOB  History: HTN, COPD    **Patient currently receiving IV Solumedrol 60 mg Q6 hours.  **Elevated lab glucose this AM.  **A1c WNL.   MD- Please consider placing order for fingerstick glucose checks Q4 hours and covering patient with Novolog Sensitive SSI (0-9 units) Q4 hours if glucose levels are elevated while patient receiving IV steroids  Please use Glycemic Control Order set     Will follow Ambrose Finland RN, MSN, CDE Diabetes Coordinator Inpatient Glycemic Control Team Team Pager: [phone] (8a-5p)

## 2014-09-05 NOTE — Progress Notes (Signed)
*  PRELIMINARY RESULTS* Echocardiogram 2D Echocardiogram has been performed.  [name] [name] 09/05/2014, 3:14 PM

## 2014-09-05 NOTE — Consult Note (Signed)
Palliative Medicine Inpatient Consult Note   Name: [name] [name] Date: 09/05/2014 MRN: [id number]  DOB: [birth year]  Referring Physician: Jaynie Crumble, MD  Palliative Care consult requested for this 69 y.o. female for goals of medical therapy in patient with COPD admitted with VDRF  [name] [name] is a 69 yo woman with PMH of COPD, tobacco use, osteoporosis. She was admitted 09/04/14 with hypercarbic respiratory failure requiring intubation. She also had ARF. At present, pt is lying in bed in CCU, intubated. Awake. Family not present.   REVIEW OF SYSTEMS:  Patient is not able to provide ROS   SOCIAL HISTORY:Pt lives at home. She has a daughter.   reports that she has quit smoking. She does not have any smokeless tobacco history on file. She reports that she does not drink alcohol.  LEGAL DOCUMENTS: unknown   CODE STATUS: Full code  PAST MEDICAL HISTORY: Past Medical History  Diagnosis Date  . COPD (chronic obstructive pulmonary disease)   . Hypertension     PAST SURGICAL HISTORY:  Past Surgical History  Procedure Laterality Date  . Abdominal hysterectomy      ALLERGIES:  has No Known Allergies.  MEDICATIONS:  Current Facility-Administered Medications  Medication Dose Route Frequency Provider Last Rate Last Dose  . antiseptic oral rinse (CPC / CETYLPYRIDINIUM CHLORIDE 0.05%) solution 7 mL  7 mL Mouth Rinse QID Vishal Mungal, MD   7 mL at 09/05/14 0400  . aspirin chewable tablet 81 mg  81 mg Oral Daily Leotis Shames, MD   81 mg at 09/04/14 2319  . chlorhexidine (PERIDEX) 0.12 % solution 15 mL  15 mL Mouth Rinse BID Vishal Mungal, MD   15 mL at 09/05/14 0800  . dexmedetomidine (PRECEDEX) 400 MCG/100ML (4 mcg/mL) infusion  0.4-1.2 mcg/kg/hr Intravenous Titrated Stephanie Acre, MD   0.4 mcg/kg/hr at 09/04/14 1602  . etomidate (AMIDATE) injection   Intravenous PRN Governor Rooks, MD   20 mg at 09/04/14 1158  . fentaNYL in NS (63mcg/ml) infusion-PREMIX  10 mcg/hr  Intravenous Continuous Stephanie Acre, MD   Stopped at 09/05/14 0934  . heparin injection 5,000 Units  5,000 Units Subcutaneous 3 times per day Gale Journey, MD   5,000 Units at 09/05/14 [phone]  . ipratropium-albuterol (DUONEB) 0.5-2.5 (3) MG/3ML nebulizer solution 3 mL  3 mL Nebulization Q4H Gale Journey, MD   3 mL at 09/05/14 0740  . Levofloxacin (LEVAQUIN) IVPB 250 mg  250 mg Intravenous Q24H Bertram Savin, [facility]      . methylPREDNISolone sodium succinate (SOLU-MEDROL) 125 mg/2 mL injection 60 mg  60 mg Intravenous Q6H Gale Journey, MD   60 mg at 09/05/14 0609  . metoprolol tartrate (LOPRESSOR) tablet 25 mg  25 mg Oral BID Gale Journey, MD   25 mg at 09/04/14 2358  . norepinephrine (LEVOPHED) 4mg  in D5W premix infusion  0-40 mcg/min Intravenous Titrated Jaynie Crumble, MD   Stopped at 09/05/14 7902  . pantoprazole sodium (PROTONIX) 40 mg/20 mL oral suspension 40 mg  40 mg Per Tube Q24H Bertram Savin, RPH      . pneumococcal 23 valent vaccine (PNU-IMMUNE) injection 0.5 mL  0.5 mL Intramuscular Tomorrow-1000 Gale Journey, MD      . succinylcholine (ANECTINE) injection   Intravenous PRN Governor Rooks, MD   120 mg at 09/04/14 1202    Vital Signs: BP 115/69 mmHg  Pulse 110  Temp(Src) 98.2 F (36.8 C) (Axillary)  Resp 25  Ht 5'  5" (1.651 m)  Wt 72.2 kg (159 lb 2.8 oz)  BMI 26.49 kg/m2  SpO2 94% Filed Weights   09/04/14 0853 09/04/14 1433  Weight: 76.658 kg (169 lb) 72.2 kg (159 lb 2.8 oz)    Estimated body mass index is 26.49 kg/(m^2) as calculated from the following:   Height as of this encounter:  (1.651 m).   Weight as of this encounter: 72.2 kg (159 lb 2.8 oz).    PHYSICAL EXAM: General: Critically ill appearing HEENT: ETT in place Neck: Trachea midline  Cardiovascular: regular rhythm, tachycardic Pulmonary/Chest: fair air movemnt ant fields, no audible wheeze Abdominal: Soft, hypoactive bowel sounds GU: Foley present, clear yellow  urine Extremities: trace edema BLE's Neurological: awake, moves extremities but does not follow commands Skin: no rashes Psychiatric: Unable to assess   LABS: CBC:  Recent Labs Lab 09/04/14 0853 09/05/14 0421  WBC 9.6 6.2  HGB 15.0 12.9  HCT 46.7 39.8  PLT 203 177   Comprehensive Metabolic Panel:  Recent Labs Lab 09/04/14 0853 09/05/14 0421  NA 138 133*  K 4.8 3.8  CL 100* 103  CO2 29 25  GLUCOSE 168* 210*  BUN 11 30*  CREATININE 0.61 1.36*  CALCIUM 8.7* 7.7*  AST 34  --   ALT 23  --   ALKPHOS 85  --   BILITOT 0.6  --     IMPRESSION: [name] [name] is a 69 yo woman with PMH of COPD, tobacco use, osteoporosis. She was admitted 09/04/14 with hypercarbic respiratory failure requiring intubation. She also had ARF. At present, pt is lying in bed in CCU, intubated. Awake.   Per RN, plan is for SBT today. Will try to speak with pt after extubation to address goals of therapy.    PLAN: As above   More than 50% of the visit was spent in counseling/coordination of care: YES  Time spent: 70 minutes

## 2014-09-06 LAB — CBC WITH DIFFERENTIAL/PLATELET
BASOS ABS: 0 10*3/uL (ref 0–0.1)
Basophils Relative: 0 %
EOS PCT: 0 %
Eosinophils Absolute: 0 10*3/uL (ref 0–0.7)
HEMATOCRIT: 36.6 % (ref 35.0–47.0)
Hemoglobin: 11.8 g/dL — ABNORMAL LOW (ref 12.0–16.0)
LYMPHS PCT: 10 %
Lymphs Abs: 1 10*3/uL (ref 1.0–3.6)
MCH: 29.3 pg (ref 26.0–34.0)
MCHC: 32.4 g/dL (ref 32.0–36.0)
MCV: 90.5 fL (ref 80.0–100.0)
MONO ABS: 0.6 10*3/uL (ref 0.2–0.9)
Monocytes Relative: 6 %
Neutro Abs: 8.3 10*3/uL — ABNORMAL HIGH (ref 1.4–6.5)
Neutrophils Relative %: 84 %
Platelets: 170 10*3/uL (ref 150–440)
RBC: 4.04 MIL/uL (ref 3.80–5.20)
RDW: 14.5 % (ref 11.5–14.5)
WBC: 9.9 10*3/uL (ref 3.6–11.0)

## 2014-09-06 LAB — BASIC METABOLIC PANEL
Anion gap: 5 (ref 5–15)
BUN: 43 mg/dL — AB (ref 6–20)
CO2: 27 mmol/L (ref 22–32)
CREATININE: 0.9 mg/dL (ref 0.44–1.00)
Calcium: 8 mg/dL — ABNORMAL LOW (ref 8.9–10.3)
Chloride: 104 mmol/L (ref 101–111)
GFR calc Af Amer: >60 mL/min (ref >60)
GLUCOSE: 124 mg/dL — AB (ref 65–99)
Potassium: 3.9 mmol/L (ref 3.5–5.1)
Sodium: 136 mmol/L (ref 135–145)

## 2014-09-06 LAB — GLUCOSE, CAPILLARY
GLUCOSE-CAPILLARY: 157 mg/dL — AB (ref 65–99)
Glucose-Capillary: 108 mg/dL — ABNORMAL HIGH (ref 65–99)
Glucose-Capillary: 118 mg/dL — ABNORMAL HIGH (ref 65–99)
Glucose-Capillary: 138 mg/dL — ABNORMAL HIGH (ref 65–99)
Glucose-Capillary: 194 mg/dL — ABNORMAL HIGH (ref 65–99)

## 2014-09-06 LAB — URINE CULTURE
Culture: NO GROWTH
Special Requests: NORMAL

## 2014-09-06 LAB — MAGNESIUM: MAGNESIUM: 2.6 mg/dL — AB (ref 1.7–2.4)

## 2014-09-06 LAB — PHOSPHORUS: PHOSPHORUS: 2.8 mg/dL (ref 2.5–4.6)

## 2014-09-06 MED ORDER — DOCUSATE SODIUM 50 MG/5ML PO LIQD
100.0000 mg | Freq: Two times a day (BID) | ORAL | Status: DC
  Administered 2014-09-06 (×2): 100 mg via ORAL
  Filled 2014-09-06 (×2): qty 10

## 2014-09-06 MED ORDER — ENOXAPARIN SODIUM 40 MG/0.4ML ~~LOC~~ SOLN
40.0000 mg | SUBCUTANEOUS | Status: DC
Start: 2014-09-06 — End: 2014-09-12
  Administered 2014-09-06 – 2014-09-12 (×7): 40 mg via SUBCUTANEOUS
  Filled 2014-09-06 (×8): qty 0.4

## 2014-09-06 MED ORDER — MIDAZOLAM HCL 2 MG/2ML IJ SOLN
2.0000 mg | INTRAMUSCULAR | Status: DC | PRN
  Administered 2014-09-07: 2 mg via INTRAVENOUS
  Filled 2014-09-06: qty 2

## 2014-09-06 MED ORDER — MIDAZOLAM HCL 2 MG/2ML IJ SOLN
4.0000 mg | Freq: Once | INTRAMUSCULAR | Status: AC
  Administered 2014-09-06: 4 mg via INTRAVENOUS
  Filled 2014-09-06: qty 4

## 2014-09-06 MED ORDER — FENTANYL CITRATE (PF) 100 MCG/2ML IJ SOLN
100.0000 ug | INTRAMUSCULAR | Status: DC | PRN
  Administered 2014-09-07: 100 ug via INTRAVENOUS
  Filled 2014-09-06: qty 2

## 2014-09-06 MED ORDER — FENTANYL CITRATE (PF) 100 MCG/2ML IJ SOLN
100.0000 ug | Freq: Once | INTRAMUSCULAR | Status: AC
  Administered 2014-09-06: 100 ug via INTRAVENOUS
  Filled 2014-09-06: qty 2

## 2014-09-06 MED ORDER — FAMOTIDINE 20 MG PO TABS
20.0000 mg | ORAL_TABLET | Freq: Two times a day (BID) | ORAL | Status: DC
  Administered 2014-09-06 – 2014-09-08 (×4): 20 mg via ORAL
  Filled 2014-09-06 (×3): qty 1

## 2014-09-06 MED ORDER — SENNA 8.6 MG PO TABS
2.0000 | ORAL_TABLET | Freq: Two times a day (BID) | ORAL | Status: DC
  Administered 2014-09-06 (×2): 17.2 mg via ORAL
  Filled 2014-09-06 (×2): qty 2

## 2014-09-06 MED ORDER — SODIUM CHLORIDE 0.9 % IV SOLN
1.0000 g | Freq: Once | INTRAVENOUS | Status: AC
  Administered 2014-09-06: 1 g via INTRAVENOUS
  Filled 2014-09-06 (×2): qty 10

## 2014-09-06 NOTE — Consult Note (Signed)
I met with pt's daughter and granddaughter. Pt lives with granddaughter and provides care for granddaughter's 69 yo child while granddaughter is at work. Pt is very independent and active. She does not wear 02 but does have a nebulizer at home which she uses prn. Pt is not limited by dyspnea. Plan is for pt to return home to same situation at discharge.

## 2014-09-06 NOTE — Progress Notes (Signed)
PROGRESS NOTE  [name] [name] ENM:[id number] DOB: September 16, 1945 DOA: 09/04/2014 PCP: Barbette Reichmann, MD  Subjective:  69 y/o f  With hx of COPD, HTN admitted with Acute Hypercapnic  Respiratory failure following episode of URI.[name] Also noted to have UTI CT Chest: Negative for PE Failed weaning trial yesterday    Consultants:  Dr. Tim Lair   Objective: BP 113/67 mmHg  Pulse 95  Temp(Src) 97.8 F (36.6 C) (Tympanic)  Resp 24  Ht 5\' 5"  (1.651 m)  Wt 70.9 kg (156 lb 4.9 oz)  BMI 26.01 kg/m2  SpO2 95%  Intake/Output Summary (Last 24 hours) at 09/06/14 0817 Last data filed at 09/06/14 0500  Gross per 24 hour  Intake 388.19 ml  Output    650 ml  Net -261.81 ml   Filed Weights   09/04/14 0853 09/04/14 1433 09/06/14 0531  Weight: 76.658 kg (169 lb) 72.2 kg (159 lb 2.8 oz) 70.9 kg (156 lb 4.9 oz)    Exam: Critically ill  On vent   General:  Intubated   Cardiovascular: S1 S2  Respiratory: Clear to auscultation  Abdomen: Soft   Neuro:Non Focal   Data Reviewed: Basic Metabolic Panel:  Recent Labs Lab 09/04/14 0853 09/05/14 0421 09/06/14 0523  NA 138 133* 136  K 4.8 3.8 3.9  CL 100* 103 104  CO2 29 25 27   GLUCOSE 168* 210* 124*  BUN 11 30* 43*  CREATININE 0.61 1.36* 0.90  CALCIUM 8.7* 7.7* 8.0*  MG  --   --  2.6*  PHOS  --   --  2.8   Liver Function Tests:  Recent Labs Lab 09/04/14 0853  AST 34  ALT 23  ALKPHOS 85  BILITOT 0.6  PROT 7.8  ALBUMIN 4.0   No results for input(s): LIPASE, AMYLASE in the last 168 hours. No results for input(s): AMMONIA in the last 168 hours. CBC:  Recent Labs Lab 09/04/14 0853 09/05/14 0421 09/06/14 0523  WBC 9.6 6.2 9.9  NEUTROABS  --   --  8.3*  HGB 15.0 12.9 11.8*  HCT 46.7 39.8 36.6  MCV 91.9 91.2 90.5  PLT 203 177 170   Cardiac Enzymes:    Recent Labs Lab 09/04/14 0853 09/04/14 1938 09/05/14 0046  TROPONINI <0.03 0.38* 0.27*   BNP (last 3 results) No results for input(s): BNP in the last 8760  hours.  ProBNP (last 3 results) No results for input(s): PROBNP in the last 8760 hours.  CBG:  Recent Labs Lab 09/04/14 1448 09/05/14 1849 09/06/14 0005 09/06/14 0357 09/06/14 0725  GLUCAP 143* 171* 108* 118* 157*    Recent Results (from the past 240 hour(s))  Culture, blood (routine x 2)     Status: None (Preliminary result)   Collection Time: 09/04/14  8:53 AM  Result Value Ref Range Status   Specimen Description BLOOD RIGHT HAND  Final   Special Requests Normal  Final   Culture NO GROWTH < 24 HOURS  Final   Report Status PENDING  Incomplete  Culture, blood (routine x 2)     Status: None (Preliminary result)   Collection Time: 09/04/14  9:17 AM  Result Value Ref Range Status   Specimen Description BLOOD RIGHT ARM  Final   Special Requests Normal  Final   Culture  Setup Time   Final    GRAM POSITIVE COCCI IN CLUSTERS AEROBIC BOTTLE ONLY CRITICAL RESULT CALLED TO, READ BACK BY AND VERIFIED WITH: KRISTY ALSTON AT [phone]  CTJ    Culture  Final    GRAM POSITIVE COCCI IN CLUSTERS AEROBIC BOTTLE ONLY IDENTIFICATION TO FOLLOW    Report Status PENDING  Incomplete  Urine culture     Status: None (Preliminary result)   Collection Time: 09/04/14  1:41 PM  Result Value Ref Range Status   Specimen Description URINE, CLEAN CATCH  Final   Special Requests Normal  Final   Culture NO GROWTH < 24 HOURS  Final   Report Status PENDING  Incomplete  MRSA PCR Screening     Status: None   Collection Time: 09/04/14  5:50 PM  Result Value Ref Range Status   MRSA by PCR NEGATIVE NEGATIVE Final    Comment:        The GeneXpert MRSA Assay (FDA approved for NASAL specimens only), is one component of a comprehensive MRSA colonization surveillance program. It is not intended to diagnose MRSA infection nor to guide or monitor treatment for MRSA infections.   Culture, expectorated sputum-assessment     Status: None   Collection Time: 09/05/14  4:21 AM  Result Value Ref Range  Status   Specimen Description ENDOTRACHEAL  Final   Special Requests NONE  Final   Sputum evaluation THIS SPECIMEN IS ACCEPTABLE FOR SPUTUM CULTURE  Final   Report Status 09/05/2014 FINAL  Final  Culture, respiratory (NON-Expectorated)     Status: None (Preliminary result)   Collection Time: 09/05/14  4:21 AM  Result Value Ref Range Status   Specimen Description ENDOTRACHEAL  Final   Special Requests NONE Reflexed from [id number]  Final   Gram Stain   Final    FAIR SPECIMEN - 70-80% WBCS RARE WBC SEEN FEW GRAM POSITIVE COCCI IN PAIRS AND CHAINS    Culture PENDING  Incomplete   Report Status PENDING  Incomplete     Studies: No results found.  Scheduled Meds: . antiseptic oral rinse  7 mL Mouth Rinse QID  . aspirin  81 mg Oral Daily  . azithromycin  500 mg Intravenous Q24H  . budesonide (PULMICORT) nebulizer solution  0.5 mg Nebulization BID  . cefTRIAXone (ROCEPHIN)  IV  2 g Intravenous Q24H  . chlorhexidine  15 mL Mouth Rinse BID  . docusate  50 mg Oral BID  . famotidine  20 mg Oral Q24H  . feeding supplement (PRO-STAT SUGAR FREE 64)  30 mL Oral TID  . free water  25 mL Per Tube 6 times per day  . ipratropium-albuterol  3 mL Nebulization Q4H  . methylPREDNISolone (SOLU-MEDROL) injection  60 mg Intravenous Daily  . metoprolol tartrate  25 mg Oral BID  . pneumococcal 23 valent vaccine  0.5 mL Intramuscular Tomorrow-1000  . senna  1 tablet Oral BID    Continuous Infusions: . feeding supplement (VITAL 1.5 CAL) 1,000 mL (09/06/14 0600)  . fentaNYL infusion INTRAVENOUS 20 mcg/hr (09/06/14 0645)    Assessment/Plan: Principal Problem: 1 Acute  Hypercapnic respiratory failure secondary to COPD exacerbation Continue SVN's and IV Solumedrol Sedation off Weaning trial later today 2 UTI; On Levaquin 3 HTN: On Metoprolol 4 Elevated Troponin: probable Demand ischemia   Code Status: Full        Shanon Seawright   09/06/2014, 8:17 AM  LOS: 2 days

## 2014-09-06 NOTE — Consult Note (Signed)
Palliative Medicine Inpatient Consult Follow Up Note   Name: [name] [name] Date: 09/06/2014 MRN: [id number]  DOB: 13-Jun-1945  Referring Physician: Jaynie Crumble, MD  Palliative Care consult requested for this 69 y.o. female for goals of medical therapy in patient with COPD admitted with VDRF  Ms [name] [name] is intubated, in spontaneous mode. Off sedation. Nods head appropriately to questions. Family not present.   REVIEW OF SYSTEMS:  Patient is not able to provide ROS  CODE STATUS: Full code   PAST MEDICAL HISTORY: Past Medical History  Diagnosis Date  . COPD (chronic obstructive pulmonary disease)   . Hypertension     PAST SURGICAL HISTORY:  Past Surgical History  Procedure Laterality Date  . Abdominal hysterectomy      Vital Signs: BP 113/67 mmHg  Pulse 95  Temp(Src) 97.8 F (36.6 C) (Tympanic)  Resp 24  Ht 5\' 5"  (1.651 m)  Wt 70.9 kg (156 lb 4.9 oz)  BMI 26.01 kg/m2  SpO2 95% Filed Weights   09/04/14 0853 09/04/14 1433 09/06/14 0531  Weight: 76.658 kg (169 lb) 72.2 kg (159 lb 2.8 oz) 70.9 kg (156 lb 4.9 oz)    Estimated body mass index is 26.01 kg/(m^2) as calculated from the following:   Height as of this encounter: 5\' 5"  (1.651 m).   Weight as of this encounter: 70.9 kg (156 lb 4.9 oz).  PHYSICAL EXAM: General: Critically ill appearing HEENT: ETT/OGT in place Neck: Trachea midline  Cardiovascular: regular rate and rhythm Pulmonary/Chest: fair air movemnt ant fields, + wheeze Abdominal: Soft, + bowel sounds GU: Foley present, clear yellow urine Extremities: trace edema BLE's Neurological: awake, moves extremities, follow commands Skin: no rashes Psychiatric: Unable to assess  LABS: CBC:    Component Value Date/Time   WBC 9.9 09/06/2014 0523   WBC 6.3 08/05/2012 0450   HGB 11.8* 09/06/2014 0523   HGB 13.6 08/05/2012 0450   HCT 36.6 09/06/2014 0523   HCT 40.0 08/05/2012 0450   PLT 170 09/06/2014 0523   PLT 241 08/05/2012 0450   MCV 90.5 09/06/2014  0523   MCV 87 08/05/2012 0450   NEUTROABS 8.3* 09/06/2014 0523   NEUTROABS 5.5 08/05/2012 0450   LYMPHSABS 1.0 09/06/2014 0523   LYMPHSABS 0.8* 08/05/2012 0450   MONOABS 0.6 09/06/2014 0523   MONOABS 0.1* 08/05/2012 0450   EOSABS 0.0 09/06/2014 0523   EOSABS 0.0 08/05/2012 0450   BASOSABS 0.0 09/06/2014 0523   BASOSABS 0.0 08/05/2012 0450   Comprehensive Metabolic Panel:    Component Value Date/Time   NA 136 09/06/2014 0523   NA 138 08/05/2012 0450   K 3.9 09/06/2014 0523   K 3.9 08/05/2012 0450   CL 104 09/06/2014 0523   CL 105 08/05/2012 0450   CO2 27 09/06/2014 0523   CO2 29 08/05/2012 0450   BUN 43* 09/06/2014 0523   BUN 21* 08/05/2012 0450   CREATININE 0.90 09/06/2014 0523   CREATININE 0.55* 08/05/2012 0450   GLUCOSE 124* 09/06/2014 0523   GLUCOSE 142* 08/05/2012 0450   CALCIUM 8.0* 09/06/2014 0523   CALCIUM 9.2 08/05/2012 0450   AST 34 09/04/2014 0853   ALT 23 09/04/2014 0853   ALKPHOS 85 09/04/2014 0853   BILITOT 0.6 09/04/2014 0853   PROT 7.8 09/04/2014 0853   ALBUMIN 4.0 09/04/2014 0853    IMPRESSION: Ms [name] [name] is a 69 yo woman with PMH of COPD, tobacco use, osteoporosis. She was admitted 09/04/14 with hypercarbic respiratory failure requiring intubation. She also had ARF.   Pt  failed SBT yesterday, likely due to sedation. Now in spontaneous mode with PS 10/PEEP 5. Doing well thus far. Anticipate extubation today. When pt off vent, will meet with her to discuss future goals of therapy.   PLAN: As above   More than 50% of the visit was spent in counseling/coordination of care: YES  Time spent: 25 minutes

## 2014-09-06 NOTE — Progress Notes (Signed)
PHARMACY - CRITICAL CARE PROGRESS NOTE  Pharmacy Consult for Ceftriaxone Dosing, Electrolyte and Constipation  Indication: Infection, ICU status requiring mechanical ventilation    No Known Allergies  Patient Measurements: Height: 5\' 5"  (165.1 cm) Weight: 156 lb 4.9 oz (70.9 kg) IBW/kg (Calculated) : 57   Vital Signs: Temp: 97.8 F (36.6 C) (06/21 0734) Temp Source: Tympanic (06/21 0734) BP: 113/67 mmHg (06/21 0600) Pulse Rate: 95 (06/21 0600) Intake/Output from previous day: 06/20 0701 - 06/21 0700 In: 438.2 [I.V.:88.2; IV Piggyback:350] Out: 650 [Urine:650] Intake/Output from this shift:   Vent settings for last 24 hours: Vent Mode:  [-] PRVC FiO2 (%):  [28 %] 28 % Set Rate:  [24 bmp] 24 bmp Vt Set:  [500 mL] 500 mL PEEP:  [5 cmH20] 5 cmH20  Labs:  Recent Labs  09/04/14 0853 09/05/14 0421 09/06/14 0523  WBC 9.6 6.2 9.9  HGB 15.0 12.9 11.8*  HCT 46.7 39.8 36.6  PLT 203 177 170  CREATININE 0.61 1.36* 0.90  MG  --   --  2.6*  PHOS  --   --  2.8  ALBUMIN 4.0  --   --   PROT 7.8  --   --   AST 34  --   --   ALT 23  --   --   ALKPHOS 85  --   --   BILITOT 0.6  --   --   BILIDIR 0.3  --   --   IBILI 0.3  --   --    Estimated Creatinine Clearance: 58.3 mL/min (by C-G formula based on Cr of 0.9).   Recent Labs  09/06/14 0005 09/06/14 0357 09/06/14 0725  GLUCAP 108* 118* 157*    Microbiology: Recent Results (from the past 720 hour(s))  Culture, blood (routine x 2)     Status: None (Preliminary result)   Collection Time: 09/04/14  8:53 AM  Result Value Ref Range Status   Specimen Description BLOOD RIGHT HAND  Final   Special Requests Normal  Final   Culture NO GROWTH < 24 HOURS  Final   Report Status PENDING  Incomplete  Culture, blood (routine x 2)     Status: None (Preliminary result)   Collection Time: 09/04/14  9:17 AM  Result Value Ref Range Status   Specimen Description BLOOD RIGHT ARM  Final   Special Requests Normal  Final   Culture   Setup Time   Final    GRAM POSITIVE COCCI IN CLUSTERS AEROBIC BOTTLE ONLY CRITICAL RESULT CALLED TO, READ BACK BY AND VERIFIED WITH: KRISTY ALSTON AT 1544 6 20 16  CTJ    Culture   Final    COAGULASE NEGATIVE STAPHYLOCOCCUS AEROBIC BOTTLE ONLY POSSIBLE CONTAMINATION WITH SKIN FLORA    Report Status PENDING  Incomplete  Urine culture     Status: None (Preliminary result)   Collection Time: 09/04/14  1:41 PM  Result Value Ref Range Status   Specimen Description URINE, CLEAN CATCH  Final   Special Requests Normal  Final   Culture NO GROWTH < 24 HOURS  Final   Report Status PENDING  Incomplete  MRSA PCR Screening     Status: None   Collection Time: 09/04/14  5:50 PM  Result Value Ref Range Status   MRSA by PCR NEGATIVE NEGATIVE Final    Comment:        The GeneXpert MRSA Assay (FDA approved for NASAL specimens only), is one component of a comprehensive MRSA colonization surveillance program. It is  not intended to diagnose MRSA infection nor to guide or monitor treatment for MRSA infections.   Culture, expectorated sputum-assessment     Status: None   Collection Time: 09/05/14  4:21 AM  Result Value Ref Range Status   Specimen Description ENDOTRACHEAL  Final   Special Requests NONE  Final   Sputum evaluation THIS SPECIMEN IS ACCEPTABLE FOR SPUTUM CULTURE  Final   Report Status 09/05/2014 FINAL  Final  Culture, respiratory (NON-Expectorated)     Status: None (Preliminary result)   Collection Time: 09/05/14  4:21 AM  Result Value Ref Range Status   Specimen Description ENDOTRACHEAL  Final   Special Requests NONE Reflexed from [id number]  Final   Gram Stain   Final    FAIR SPECIMEN - 70-80% WBCS RARE WBC SEEN FEW GRAM POSITIVE COCCI IN PAIRS AND CHAINS    Culture PENDING  Incomplete   Report Status PENDING  Incomplete    Medications:  Scheduled:  . antiseptic oral rinse  7 mL Mouth Rinse QID  . aspirin  81 mg Oral Daily  . azithromycin  500 mg Intravenous Q24H  .  budesonide (PULMICORT) nebulizer solution  0.5 mg Nebulization BID  . cefTRIAXone (ROCEPHIN)  IV  2 g Intravenous Q24H  . chlorhexidine  15 mL Mouth Rinse BID  . docusate  100 mg Oral BID  . famotidine  20 mg Oral BID  . feeding supplement (PRO-STAT SUGAR FREE 64)  30 mL Oral TID  . free water  25 mL Per Tube 6 times per day  . ipratropium-albuterol  3 mL Nebulization Q4H  . methylPREDNISolone (SOLU-MEDROL) injection  60 mg Intravenous Daily  . metoprolol tartrate  25 mg Oral BID  . pneumococcal 23 valent vaccine  0.5 mL Intramuscular Tomorrow-1000  . senna  2 tablet Oral BID   Infusions:  . feeding supplement (VITAL 1.5 CAL) 1,000 mL (09/06/14 0600)  . fentaNYL infusion INTRAVENOUS 20 mcg/hr (09/06/14 0645)    Assessment: 69 yo female admitted for acute respiratory  failure from acute COPD exacerbation. Patient currently on day 3 of antibiotics ( day 2 of ceftriaxone and azithromycin). Patient is currently receiving fentanyl drip.      Plan:   1. Ceftriaxone: Patient currently receiving ceftriaxone 2g IV q24hr to cover possible CAP/ possible bacteremia. Blood cultures growing gram positive cocci in clusters x 1. Sputum growing gram positive cocci in pairs and chains.    2. Electrolytes: No replacement warranted at this time. Will obtain follow-up labs in am.    3. Constipation: No documented bowel movement and patient receiving fentanyl drip @ 200 mcg/hr. Will increase regimen to docusate  VT BID and senna 8.8mg  VT BID.    Pharmacy will continue to monitor and adjust per consult.    Telitha Plath L 09/06/2014,8:48 AM

## 2014-09-06 NOTE — Progress Notes (Signed)
PULMONARY / CRITICAL CARE MEDICINE   Name: [name] [name] MRN: [id number] DOB: 08/12/1945    ADMISSION DATE:  09/04/2014 CONSULTATION DATE:  09/04/14  REFERRING MD :  Dr. Clent Ridges    Subjective   Patient intubated,sedated, plan for SAT/SBT when sedation off and patient fully awake, TF's started yesterday. GPC in sputum and blood cultures waiting sens reports   SIGNIFICANT EVENTS  6/19 - intubated for hypercapenic respiratory failure in [facility] ED      PAST MEDICAL HISTORY    :  Past Medical History  Diagnosis Date  . COPD (chronic obstructive pulmonary disease)   . Hypertension    Past Surgical History  Procedure Laterality Date  . Abdominal hysterectomy     Prior to Admission medications   Medication Sig Start Date End Date Taking? Authorizing Provider  ADVAIR DISKUS 500-50 MCG/DOSE AEPB Inhale 1 puff into the lungs every 12 (twelve) hours. 08/09/14  Yes Historical Provider, MD  albuterol (PROVENTIL) (2.5 MG/3ML) 0.083% nebulizer solution Inhale 3 mLs into the lungs every 6 (six) hours as needed. For wheezing 02/15/14  Yes Historical Provider, MD  metoprolol tartrate (LOPRESSOR) 25 MG tablet Take 25 mg by mouth 2 (two) times daily. 08/05/14  Yes Historical Provider, MD  predniSONE (DELTASONE) 5 MG tablet Take 5 mg by mouth daily. 08/19/14  Yes Historical Provider, MD  VENTOLIN HFA 108 (90 BASE) MCG/ACT inhaler Inhale 2 puffs into the lungs every 6 (six) hours as needed. For wheezing 08/30/14  Yes Historical Provider, MD  Vitamin D, Ergocalciferol, (DRISDOL) 50000 UNITS CAPS capsule Take 50,000 Units by mouth once a week. 09/01/14  Yes Historical Provider, MD   No Known Allergies   FAMILY HISTORY   Family History  Problem Relation Age of Onset  . Hypertension Mother   . Melanoma Father       SOCIAL HISTORY    reports that she has quit smoking. She does not have any smokeless tobacco history on file. She reports that she does not drink alcohol. Her drug history is not on  file.  Review of Systems  Unable to perform ROS: critical illness   - unable to obtain, patient intubated and sedated    VITAL SIGNS    Temp:  [97.3 F (36.3 C)-98.3 F (36.8 C)] 97.8 F (36.6 C) (06/21 0734) Pulse Rate:  [95-137] 106 (06/21 1038) Resp:  [16-24] 24 (06/21 0600) BP: (88-113)/(61-76) 103/76 mmHg (06/21 1038) SpO2:  [90 %-97 %] 95 % (06/21 0600) FiO2 (%):  [28 %] 28 % (06/21 0734) Weight:  [156 lb 4.9 oz (70.9 kg)] 156 lb 4.9 oz (70.9 kg) (06/21 0531) HEMODYNAMICS:   VENTILATOR SETTINGS: Vent Mode:  [-] PRVC FiO2 (%):  [28 %] 28 % Set Rate:  [24 bmp] 24 bmp Vt Set:  [500 mL] 500 mL PEEP:  [5 cmH20] 5 cmH20 INTAKE / OUTPUT:  Intake/Output Summary (Last 24 hours) at 09/06/14 1109 Last data filed at 09/06/14 0730  Gross per 24 hour  Intake 713.25 ml  Output    650 ml  Net  63.25 ml       PHYSICAL EXAM   Physical Exam  Constitutional: No distress.  HENT:  Head: Normocephalic and atraumatic.  Eyes: Conjunctivae are normal. Pupils are equal, round, and reactive to light.  Cardiovascular:  No murmur heard. Pulmonary/Chest: No respiratory distress. She has wheezes. She has rales.  Abdominal: Soft. Bowel sounds are normal.  Musculoskeletal: Normal range of motion.  Neurological:  GCS<8T  Skin: She is not  diaphoretic.       LABS   LABS:  CBC  Recent Labs Lab 09/04/14 0853 09/05/14 0421 09/06/14 0523  WBC 9.6 6.2 9.9  HGB 15.0 12.9 11.8*  HCT 46.7 39.8 36.6  PLT 203 177 170   Coag's No results for input(s): APTT, INR in the last 168 hours. BMET  Recent Labs Lab 09/04/14 0853 09/05/14 0421 09/06/14 0523  NA 138 133* 136  K 4.8 3.8 3.9  CL 100* 103 104  CO2 29 25 27   BUN 11 30* 43*  CREATININE 0.61 1.36* 0.90  GLUCOSE 168* 210* 124*   Electrolytes  Recent Labs Lab 09/04/14 0853 09/05/14 0421 09/06/14 0523  CALCIUM 8.7* 7.7* 8.0*  MG  --   --  2.6*  PHOS  --   --  2.8   Sepsis Markers No results for input(s):  LATICACIDVEN, PROCALCITON, O2SATVEN in the last 168 hours. ABG  Recent Labs Lab 09/04/14 1520 09/04/14 2020  PHART 7.28* 7.31*  PCO2ART 57* 49*  PO2ART 104 59*   Liver Enzymes  Recent Labs Lab 09/04/14 0853  AST 34  ALT 23  ALKPHOS 85  BILITOT 0.6  ALBUMIN 4.0   Cardiac Enzymes  Recent Labs Lab 09/04/14 0853 09/04/14 1938 09/05/14 0046  TROPONINI <0.03 0.38* 0.27*   Glucose  Recent Labs Lab 09/04/14 1448 09/05/14 1849 09/06/14 0005 09/06/14 0357 09/06/14 0725  GLUCAP 143* 171* 108* 118* 157*     Recent Results (from the past 240 hour(s))  Culture, blood (routine x 2)     Status: None (Preliminary result)   Collection Time: 09/04/14  8:53 AM  Result Value Ref Range Status   Specimen Description BLOOD RIGHT HAND  Final   Special Requests Normal  Final   Culture NO GROWTH 2 DAYS  Final   Report Status PENDING  Incomplete  Culture, blood (routine x 2)     Status: None (Preliminary result)   Collection Time: 09/04/14  9:17 AM  Result Value Ref Range Status   Specimen Description BLOOD RIGHT ARM  Final   Special Requests Normal  Final   Culture  Setup Time   Final    GRAM POSITIVE COCCI IN CLUSTERS AEROBIC BOTTLE ONLY CRITICAL RESULT CALLED TO, READ BACK BY AND VERIFIED WITH: KRISTY ALSTON AT 1544 6 20 16  CTJ    Culture   Final    COAGULASE NEGATIVE STAPHYLOCOCCUS AEROBIC BOTTLE ONLY POSSIBLE CONTAMINATION WITH SKIN FLORA    Report Status PENDING  Incomplete  Urine culture     Status: None (Preliminary result)   Collection Time: 09/04/14  1:41 PM  Result Value Ref Range Status   Specimen Description URINE, CLEAN CATCH  Final   Special Requests Normal  Final   Culture NO GROWTH < 24 HOURS  Final   Report Status PENDING  Incomplete  MRSA PCR Screening     Status: None   Collection Time: 09/04/14  5:50 PM  Result Value Ref Range Status   MRSA by PCR NEGATIVE NEGATIVE Final    Comment:        The GeneXpert MRSA Assay (FDA approved for NASAL  specimens only), is one component of a comprehensive MRSA colonization surveillance program. It is not intended to diagnose MRSA infection nor to guide or monitor treatment for MRSA infections.   Culture, expectorated sputum-assessment     Status: None   Collection Time: 09/05/14  4:21 AM  Result Value Ref Range Status   Specimen Description ENDOTRACHEAL  Final  Special Requests NONE  Final   Sputum evaluation THIS SPECIMEN IS ACCEPTABLE FOR SPUTUM CULTURE  Final   Report Status 09/05/2014 FINAL  Final  Culture, respiratory (NON-Expectorated)     Status: None (Preliminary result)   Collection Time: 09/05/14  4:21 AM  Result Value Ref Range Status   Specimen Description ENDOTRACHEAL  Final   Special Requests NONE Reflexed from [id number]  Final   Gram Stain   Final    FAIR SPECIMEN - 70-80% WBCS RARE WBC SEEN FEW GRAM POSITIVE COCCI IN PAIRS AND CHAINS    Culture PENDING  Incomplete   Report Status PENDING  Incomplete     Current facility-administered medications:  .  antiseptic oral rinse (CPC / CETYLPYRIDINIUM CHLORIDE 0.05%) solution 7 mL, 7 mL, Mouth Rinse, QID, Vishal Mungal, MD, 7 mL at 09/06/14 0400 .  aspirin chewable tablet 81 mg, 81 mg, Oral, Daily, Leotis Shames, MD, 81 mg at 09/06/14 1038 .  azithromycin (ZITHROMAX) 500 mg in dextrose 5 % 250 mL IVPB, 500 mg, Intravenous, Q24H, Erin Fulling, MD, 500 mg at 09/05/14 2004 .  budesonide (PULMICORT) nebulizer solution 0.5 mg, 0.5 mg, Nebulization, BID, Erin Fulling, MD, 0.5 mg at 09/06/14 0730 .  cefTRIAXone (ROCEPHIN) 2 g in dextrose 5 % 50 mL IVPB - Premix, 2 g, Intravenous, Q24H, Bertram Savin, RPH .  chlorhexidine (PERIDEX) 0.12 % solution 15 mL, 15 mL, Mouth Rinse, BID, Vishal Mungal, MD, 15 mL at 09/06/14 0800 .  docusate (COLACE) 50 MG/5ML liquid 100 mg, 100 mg, Oral, BID, Bertram Savin, RPH, 100 mg at 09/06/14 1038 .  etomidate (AMIDATE) injection, , Intravenous, PRN, Governor Rooks, MD, 20 mg at 09/04/14 1158 .   famotidine (PEPCID) tablet 20 mg, 20 mg, Oral, BID, Bertram Savin, [facility] .  feeding supplement (PRO-STAT SUGAR FREE 64) liquid 30 mL, 30 mL, Oral, TID, Erin Fulling, MD, 30 mL at 09/05/14 2203 .  feeding supplement (VITAL 1.5 CAL) liquid 1,000 mL, 1,000 mL, Per Tube, Continuous, Erin Fulling, MD, Last Rate: 41 mL/hr at 09/06/14 0600, 1,000 mL at 09/06/14 0600 .  fentaNYL in NS (32mcg/ml) infusion-PREMIX, 10 mcg/hr, Intravenous, Continuous, Stephanie Acre, MD, Stopped at 09/06/14 0730 .  free water 25 mL, 25 mL, Per Tube, 6 times per day, Erin Fulling, MD, 25 mL at 09/06/14 0800 .  ipratropium-albuterol (DUONEB) 0.5-2.5 (3) MG/3ML nebulizer solution 3 mL, 3 mL, Nebulization, Q4H, Gale Journey, MD, 3 mL at 09/06/14 0730 .  methylPREDNISolone sodium succinate (SOLU-MEDROL) 125 mg/2 mL injection 60 mg, 60 mg, Intravenous, Daily, Erin Fulling, MD .  metoprolol tartrate (LOPRESSOR) tablet 25 mg, 25 mg, Oral, BID, Gale Journey, MD, 25 mg at 09/06/14 1038 .  pneumococcal 23 valent vaccine (PNU-IMMUNE) injection 0.5 mL, 0.5 mL, Intramuscular, Tomorrow-1000, Gale Journey, MD, 0.5 mL at 09/05/14 0946 .  senna (SENOKOT) tablet 17.2 mg, 2 tablet, Oral, BID, Bertram Savin, RPH, 17.2 mg at 09/06/14 1038 .  succinylcholine (ANECTINE) injection, , Intravenous, PRN, Governor Rooks, MD, 120 mg at 09/04/14 1202  IMAGING    No results found.    Indwelling Urinary Catheter continued, requirement due to   Reason to continue Indwelling Urinary Catheter for strict Intake/Output monitoring for hemodynamic instability   Central Line continued, requirement due to   Reason to continue [employer] Monitoring of central venous pressure or other hemodynamic parameters   Ventilator continued, requirement due to, resp failure    Ventilator Sedation RASS 0 to -1  Lines       ASSESSMENT/PLAN  69 yo female with PMHx of COPD, OA, HTN, admitted for acute resp failure from acute COPD  exacerbation lieklu from acute bronchitis/atypical pneumonia.  PULMONARY A: AECOPD COPD Respiratory Failure Mechanical ventialtion P:   - continue with MV, wean as tolerated - check ABG/CXR as needed - VAP bundle -assess for SAT/SBt today   CARDIOVASCULAR P:  - cont to monitor hemodynamics - cont with BP monitoring.   RENAL P:   - ICU electrolyte replacement protocol - avoid nephrotoxic agents  GASTROINTESTINAL P:   - FEN - started tube feeds - cont PPI for stress ulcer prophylaxis  INFECTIOUS A:  Atypical pneumonia P:   - cont with current antibiotics - follow up cultures.  BCx2  -Sputum pending-await sens reports  ENDOCRINE P:   - ICU hypoglycemic\Hyperglycemia protocol  NEUROLOGIC P:   -wean sedation    I have personally obtained a history, examined the patient, evaluated laboratory and imaging results, formulated the assessment and plan and placed orders.  The Patient requires high complexity decision making for assessment and support, frequent evaluation and titration of therapies, application of advanced monitoring technologies and extensive interpretation of multiple databases. Critical Care Time devoted to patient care services described in this note is 45 minutes.   Overall, patient is critically ill, prognosis is guarded. Patient at high risk for cardiac arrest and death.       September 17, 2014, 11:09 AM

## 2014-09-06 NOTE — Progress Notes (Signed)
Nutrition Follow-up    INTERVENTION:   EN: recommend continuing current TF regimen  NUTRITION DIAGNOSIS:  Inadequate oral intake related to acute illness as evidenced by NPO status. Being addressed via TF    GOAL:  Provide needs based on ASPEN/SCCM guidelines   MONITOR:   (Energy Intake, Pulmonary, EN, Digestive System, Electrolyte/Renal PRofile, Glucose Profile)   ASSESSMENT:  Pt remains on vent, weaning trial again today  EN: tolerating Vital 1.5 at rate of 41 ml/hr with Prostat TID  Digestive System: no signs of TF intolerance  Glucose Profile:  Recent Labs  09/06/14 0357 09/06/14 0725 09/06/14 1200  GLUCAP 118* 157* 138*   Electrolyte and Renal Profile:  Recent Labs Lab 09/04/14 0853 09/05/14 0421 09/06/14 0523  BUN 11 30* 43*  CREATININE 0.61 1.36* 0.90  NA 138 133* 136  K 4.8 3.8 3.9  MG  --   --  2.6*  PHOS  --   --  2.8   Meds: reviewed   Height:  Ht Readings from Last 1 Encounters:  09/04/14 5\' 5"  (1.651 m)    Weight:  Wt Readings from Last 1 Encounters:  09/06/14 156 lb 4.9 oz (70.9 kg)   Filed Weights   09/04/14 0853 09/04/14 1433 09/06/14 0531  Weight: 169 lb (76.658 kg) 159 lb 2.8 oz (72.2 kg) 156 lb 4.9 oz (70.9 kg)     BMI:  Body mass index is 26.01 kg/(m^2).  Estimated Nutritional Needs:  Kcal:  1792 kcals (BEE 1409, Ve: 12, Tmax: 37.6) using current wt of 72.2 kg  Protein:  108-144 g(1.5-2.0 g/kg)   Fluid:  1800-2160 mL (25-30 ml/kg)   Diet Order:  Diet NPO time specified  EDUCATION NEEDS:  No education needs identified at this time   Intake/Output Summary (Last 24 hours) at 09/06/14 1223 Last data filed at 09/06/14 0730  Gross per 24 hour  Intake 713.25 ml  Output    650 ml  Net  63.25 ml   HIGH Care Level  Romelle Starcher MS, RD, LDN [phone] Pager

## 2014-09-06 NOTE — Progress Notes (Signed)
Patient ST on cardiac monitor. Alert/drowsy, sedated with Fentanyl, follows commands. IVF infusing through right UE PICC. OG with tube feeds at 41/25, goal rate. 650 UOP this shift. Day plans for SBT, family updated on POC.

## 2014-09-06 NOTE — Progress Notes (Signed)
Subjective:   intubated sedated arousable not able to talk since she is intubated. No apparent distress  Objective:  Vital Signs in the last 24 hours: Temp:  [97.8 F (36.6 C)-98.3 F (36.8 C)] 98.3 F (36.8 C) (06/21 1200) Pulse Rate:  [93-137] 93 (06/21 1500) Resp:  [16-24] 24 (06/21 1500) BP: (88-118)/(61-81) 110/69 mmHg (06/21 1500) SpO2:  [93 %-97 %] 95 % (06/21 1500) FiO2 (%):  [28 %] 28 % (06/21 1510) Weight:  [70.9 kg (156 lb 4.9 oz)] 70.9 kg (156 lb 4.9 oz) (06/21 0531)  Intake/Output from previous day: 06/20 0701 - 06/21 0700 In: 438.2 [I.V.:88.2; IV Piggyback:350] Out: 650 [Urine:650] Intake/Output from this shift: Total I/O In: 363.3 [I.V.:363.3] Out: -   Physical Exam: General appearance: alert, cooperative and appears stated age Neck: no adenopathy, no carotid bruit, no JVD, supple, symmetrical, trachea midline and thyroid not enlarged, symmetric, no tenderness/mass/nodules Lungs: clear to auscultation bilaterally and normal percussion bilaterally Heart: regular rate and rhythm, S1, S2 normal, no murmur, click, rub or gallop Abdomen: soft, non-tender; bowel sounds normal; no masses,  no organomegaly Extremities: extremities normal, atraumatic, no cyanosis or edema Pulses: 2+ and symmetric Skin: Skin color, texture, turgor normal. No rashes or lesions Neurologic: Grossly normal  Lab Results:  Recent Labs  09/05/14 0421 09/06/14 0523  WBC 6.2 9.9  HGB 12.9 11.8*  PLT 177 170    Recent Labs  09/05/14 0421 09/06/14 0523  NA 133* 136  K 3.8 3.9  CL 103 104  CO2 25 27  GLUCOSE 210* 124*  BUN 30* 43*  CREATININE 1.36* 0.90    Recent Labs  09/04/14 1938 09/05/14 0046  TROPONINI 0.38* 0.27*   Hepatic Function Panel  Recent Labs  09/04/14 0853  PROT 7.8  ALBUMIN 4.0  AST 34  ALT 23  ALKPHOS 85  BILITOT 0.6  BILIDIR 0.3  IBILI 0.3   No results for input(s): CHOL in the last 72 hours. No results for input(s): PROTIME in the last 72  hours.  Imaging: Imaging results have been reviewed  Cardiac Studies:  Assessment/Plan:  Cardiomyopathy CHF   elevated troponin  respiratory failure  COPD  cardiomegaly  urinary tract infection  GERD  .PLAN  continue inhalers for COPD  continue vent support and ICU therapy  steroid taper  agree with echocardiogram for assessment of LV function  probably demand ischemia  DVT prophylaxis  continue hypertension control  antibiotic therapy for pneumonia and bronchitis  Pepcid for GERD symptoms  will wean off and when possible  will continue to follow conservatively  LOS: 2 days    [name] [name],[name] D. 09/06/2014, 4:55 PM

## 2014-09-06 NOTE — Consult Note (Signed)
Reason for Consult:Elevated troponin, respiratory failure, shortness of breath Referring Physician:  Dr Lenna Sciara Singh/Dr Volanda Napoleon  [name] [name] is an 69 y.o. female.  HPI:  69 year old black female history of COPD hypertension presented with yesterday stress at home. The patient's daughter stated that the Send the patient is having trouble with shortness of breath had been placed on BiPAP but eventually tired and required intubation because of acute hypercapneia . The patient complained of symptoms for over the last 3 days cough congestion shortness of breath which got progressively worse. Patient is had severe COPD symptoms and was found have a slightly elevated troponin so she was admitted to intensive care unit intubated. Cardiology consultation was recommended because of elevated troponin shortness of breath.  Past Medical History  Diagnosis Date  . COPD (chronic obstructive pulmonary disease)   . Hypertension     Past Surgical History  Procedure Laterality Date  . Abdominal hysterectomy      Family History  Problem Relation Age of Onset  . Hypertension Mother   . Melanoma Father     Social History:  reports that she has quit smoking. She does not have any smokeless tobacco history on file. She reports that she does not drink alcohol. Her drug history is not on file.  Allergies: No Known Allergies  Medications:  Prior to Admission:  Prescriptions prior to admission  Medication Sig Dispense Refill Last Dose  . ADVAIR DISKUS 500-50 MCG/DOSE AEPB Inhale 1 puff into the lungs every 12 (twelve) hours.   unknown  . albuterol (PROVENTIL) (2.5 MG/3ML) 0.083% nebulizer solution Inhale 3 mLs into the lungs every 6 (six) hours as needed. For wheezing   unknown  . metoprolol tartrate (LOPRESSOR) 25 MG tablet Take 25 mg by mouth 2 (two) times daily.   unknown  . predniSONE (DELTASONE) 5 MG tablet Take 5 mg by mouth daily.   unknown  . VENTOLIN HFA 108 (90 BASE) MCG/ACT inhaler Inhale 2 puffs into  the lungs every 6 (six) hours as needed. For wheezing   unknown  . Vitamin D, Ergocalciferol, (DRISDOL) 50000 UNITS CAPS capsule Take 50,000 Units by mouth once a week.   unknown    Results for orders placed or performed during the hospital encounter of 09/04/14 (from the past 48 hour(s))  Basic metabolic panel  (if pt has PMH of COPD)     Status: Abnormal   Collection Time: 09/04/14  8:53 AM  Result Value Ref Range   Sodium 138 135 - 145 mmol/L   Potassium 4.8 3.5 - 5.1 mmol/L    Comment: HEMOLYSIS AT THIS LEVEL MAY AFFECT RESULT   Chloride 100 (L) 101 - 111 mmol/L   CO2 29 22 - 32 mmol/L   Glucose, Bld 168 (H) 65 - 99 mg/dL   BUN 11 6 - 20 mg/dL   Creatinine, Ser 0.61 0.44 - 1.00 mg/dL   Calcium 8.7 (L) 8.9 - 10.3 mg/dL   GFR calc non Af Amer >60 >60 mL/min   GFR calc Af Amer >60 >60 mL/min    Comment: (NOTE) The eGFR has been calculated using the CKD EPI equation. This calculation has not been validated in all clinical situations. eGFR's persistently <60 mL/min signify possible Chronic Kidney Disease.    Anion gap 9 5 - 15  CBC  (if pt has PMH of COPD)     Status: None   Collection Time: 09/04/14  8:53 AM  Result Value Ref Range   WBC 9.6 3.6 - 11.0  K/uL   RBC 5.08 3.80 - 5.20 MIL/uL   Hemoglobin 15.0 12.0 - 16.0 g/dL   HCT 04.1 41.2 - 05.5 %   MCV 91.9 80.0 - 100.0 fL   MCH 29.4 26.0 - 34.0 pg   MCHC 32.0 32.0 - 36.0 g/dL   RDW 87.9 78.4 - 91.4 %   Platelets 203 150 - 440 K/uL  Troponin I  (if patient has PMH of COPD)     Status: None   Collection Time: 09/04/14  8:53 AM  Result Value Ref Range   Troponin I <0.03 <0.031 ng/mL    Comment:        NO INDICATION OF MYOCARDIAL INJURY.   Hepatic function panel     Status: None   Collection Time: 09/04/14  8:53 AM  Result Value Ref Range   Total Protein 7.8 6.5 - 8.1 g/dL   Albumin 4.0 3.5 - 5.0 g/dL   AST 34 15 - 41 U/L    Comment: HEMOLYSIS AT THIS LEVEL MAY AFFECT RESULT   ALT 23 14 - 54 U/L   Alkaline  Phosphatase 85 38 - 126 U/L   Total Bilirubin 0.6 0.3 - 1.2 mg/dL    Comment: HEMOLYSIS AT THIS LEVEL MAY AFFECT RESULT   Bilirubin, Direct 0.3 0.1 - 0.5 mg/dL    Comment: HEMOLYSIS AT THIS LEVEL MAY AFFECT RESULT   Indirect Bilirubin 0.3 0.3 - 0.9 mg/dL  Hemoglobin L6U     Status: None   Collection Time: 09/04/14  8:53 AM  Result Value Ref Range   Hgb A1c MFr Bld 6.0 4.0 - 6.0 %  Culture, blood (routine x 2)     Status: None (Preliminary result)   Collection Time: 09/04/14  8:53 AM  Result Value Ref Range   Specimen Description BLOOD RIGHT HAND    Special Requests Normal    Culture NO GROWTH < 24 HOURS    Report Status PENDING   Culture, blood (routine x 2)     Status: None (Preliminary result)   Collection Time: 09/04/14  9:17 AM  Result Value Ref Range   Specimen Description BLOOD RIGHT ARM    Special Requests Normal    Culture  Setup Time      GRAM POSITIVE COCCI IN CLUSTERS AEROBIC BOTTLE ONLY CRITICAL RESULT CALLED TO, READ BACK BY AND VERIFIED WITH: KRISTY ALSTON AT 1544 6 20 16  CTJ    Culture      GRAM POSITIVE COCCI IN CLUSTERS AEROBIC BOTTLE ONLY IDENTIFICATION TO FOLLOW    Report Status PENDING   Blood gas, venous     Status: Abnormal (Preliminary result)   Collection Time: 09/04/14 10:50 AM  Result Value Ref Range   FIO2 0.30 %   Delivery systems BILEVEL POSITIVE AIRWAY PRESSURE    Rate 12 resp/min   Peep/cpap 7.0 cm H20   pH, Ven 7.16 (LL) 7.320 - 7.430    Comment: CRITICAL RESULT CALLED TO, READ BACK BY AND VERIFIED WITH:   pCO2, Ven 91 (HH) 44.0 - 60.0 mmHg    Comment: CRITICAL RESULT CALLED TO, READ BACK BY AND VERIFIED WITH: dr.lord    pO2, Ven 36.0 30.0 - 45.0 mmHg   Bicarbonate 32.4 (H) 21.0 - 28.0 mEq/L   Acid-Base Excess 0.4 0.0 - 3.0 mmol/L   O2 Saturation 51.3 %   Patient temperature 37.0    Collection site venous    Sample type PENDING    Mechanical Rate PENDING   Urinalysis complete, with microscopic ([facility] only)  Status: Abnormal    Collection Time: 09/04/14  1:41 PM  Result Value Ref Range   Color, Urine YELLOW (A) YELLOW   APPearance CLOUDY (A) CLEAR   Glucose, UA NEGATIVE NEGATIVE mg/dL   Bilirubin Urine NEGATIVE NEGATIVE   Ketones, ur NEGATIVE NEGATIVE mg/dL   Specific Gravity, Urine 1.020 1.005 - 1.030   Hgb urine dipstick 1+ (A) NEGATIVE   pH 5.0 5.0 - 8.0   Protein, ur 100 (A) NEGATIVE mg/dL   Nitrite NEGATIVE NEGATIVE   Leukocytes, UA NEGATIVE NEGATIVE   RBC / HPF 0-5 0 - 5 RBC/hpf   WBC, UA 6-30 0 - 5 WBC/hpf   Bacteria, UA RARE (A) NONE SEEN   Squamous Epithelial / LPF 0-5 (A) NONE SEEN   Mucous PRESENT    Hyaline Casts, UA PRESENT   Urine culture     Status: None (Preliminary result)   Collection Time: 09/04/14  1:41 PM  Result Value Ref Range   Specimen Description URINE, CLEAN CATCH    Special Requests Normal    Culture NO GROWTH < 24 HOURS    Report Status PENDING   Glucose, capillary     Status: Abnormal   Collection Time: 09/04/14  2:48 PM  Result Value Ref Range   Glucose-Capillary 143 (H) 65 - 99 mg/dL  Blood gas, arterial     Status: Abnormal (Preliminary result)   Collection Time: 09/04/14  3:20 PM  Result Value Ref Range   FIO2 0.35 %   O2 Content PENDING L/min   Delivery systems VENTILATOR    Mode PRESSURE REGULATED VOLUME CONTROL    VT 450 mL   Rate 24 resp/min   Peep/cpap 5.0 cm H20   pH, Arterial 7.28 (L) 7.350 - 7.450   pCO2 arterial 57 (H) 32.0 - 48.0 mmHg   pO2, Arterial 104 83.0 - 108.0 mmHg   Bicarbonate 26.8 21.0 - 28.0 mEq/L   Acid-base deficit 1.0 0.0 - 2.0 mmol/L   O2 Saturation 97.2 %   Patient temperature 37.0    Collection site LEFT RADIAL    Drawn by ARTERIAL DRAW    Sample type PENDING    Allens test (pass/fail) YES (A) PASS   Mechanical Rate PENDING   MRSA PCR Screening     Status: None   Collection Time: 09/04/14  5:50 PM  Result Value Ref Range   MRSA by PCR NEGATIVE NEGATIVE    Comment:        The GeneXpert MRSA Assay (FDA approved for NASAL  specimens only), is one component of a comprehensive MRSA colonization surveillance program. It is not intended to diagnose MRSA infection nor to guide or monitor treatment for MRSA infections.   Troponin I (q 6hr x 3)     Status: Abnormal   Collection Time: 09/04/14  7:38 PM  Result Value Ref Range   Troponin I 0.38 (H) <0.031 ng/mL    Comment: READ BACK AND VERIFIED WITH ZACHARY ALLEN AT 2012 09/04/14.PMH        PERSISTENTLY INCREASED TROPONIN VALUES IN THE RANGE OF 0.04-0.49 ng/mL CAN BE SEEN IN:       -UNSTABLE ANGINA       -CONGESTIVE HEART FAILURE       -MYOCARDITIS       -CHEST TRAUMA       -ARRYHTHMIAS       -LATE PRESENTING MYOCARDIAL INFARCTION       -COPD   CLINICAL FOLLOW-UP RECOMMENDED.   Blood gas, arterial  Status: Abnormal   Collection Time: 09/04/14  8:20 PM  Result Value Ref Range   FIO2 0.28 %   Delivery systems VENTILATOR    Mode PRESSURE REGULATED VOLUME CONTROL    VT 500 mL   Peep/cpap 5.0 cm H20   pH, Arterial 7.31 (L) 7.350 - 7.450   pCO2 arterial 49 (H) 32.0 - 48.0 mmHg   pO2, Arterial 59 (L) 83.0 - 108.0 mmHg   Bicarbonate 24.7 21.0 - 28.0 mEq/L   Acid-base deficit 2.1 (H) 0.0 - 2.0 mmol/L   O2 Saturation 87.4 %   Patient temperature 37.0    Collection site RIGHT RADIAL    Sample type ARTERIAL DRAW    Allens test (pass/fail) POSITIVE (A) PASS   Mechanical Rate 24   Troponin I (q 6hr x 3)     Status: Abnormal   Collection Time: 09/05/14 12:46 AM  Result Value Ref Range   Troponin I 0.27 (H) <0.031 ng/mL    Comment: READ BACK AND VERIFIED WITH ZACHARY ALLEN AT 0112 09/05/14.PMH        PERSISTENTLY INCREASED TROPONIN VALUES IN THE RANGE OF 0.04-0.49 ng/mL CAN BE SEEN IN:       -UNSTABLE ANGINA       -CONGESTIVE HEART FAILURE       -MYOCARDITIS       -CHEST TRAUMA       -ARRYHTHMIAS       -LATE PRESENTING MYOCARDIAL INFARCTION       -COPD   CLINICAL FOLLOW-UP RECOMMENDED.   Culture, expectorated sputum-assessment     Status: None    Collection Time: 09/05/14  4:21 AM  Result Value Ref Range   Specimen Description ENDOTRACHEAL    Special Requests NONE    Sputum evaluation THIS SPECIMEN IS ACCEPTABLE FOR SPUTUM CULTURE    Report Status 09/05/2014 FINAL   CBC     Status: None   Collection Time: 09/05/14  4:21 AM  Result Value Ref Range   WBC 6.2 3.6 - 11.0 K/uL   RBC 4.36 3.80 - 5.20 MIL/uL   Hemoglobin 12.9 12.0 - 16.0 g/dL   HCT 39.8 35.0 - 47.0 %   MCV 91.2 80.0 - 100.0 fL   MCH 29.5 26.0 - 34.0 pg   MCHC 32.4 32.0 - 36.0 g/dL   RDW 14.0 11.5 - 14.5 %   Platelets 177 150 - 440 K/uL  Basic metabolic panel     Status: Abnormal   Collection Time: 09/05/14  4:21 AM  Result Value Ref Range   Sodium 133 (L) 135 - 145 mmol/L   Potassium 3.8 3.5 - 5.1 mmol/L   Chloride 103 101 - 111 mmol/L   CO2 25 22 - 32 mmol/L   Glucose, Bld 210 (H) 65 - 99 mg/dL   BUN 30 (H) 6 - 20 mg/dL   Creatinine, Ser 1.36 (H) 0.44 - 1.00 mg/dL   Calcium 7.7 (L) 8.9 - 10.3 mg/dL   GFR calc non Af Amer 39 (L) >60 mL/min   GFR calc Af Amer 45 (L) >60 mL/min    Comment: (NOTE) The eGFR has been calculated using the CKD EPI equation. This calculation has not been validated in all clinical situations. eGFR's persistently <60 mL/min signify possible Chronic Kidney Disease.    Anion gap 5 5 - 15  Culture, respiratory (NON-Expectorated)     Status: None (Preliminary result)   Collection Time: 09/05/14  4:21 AM  Result Value Ref Range   Specimen Description ENDOTRACHEAL  Special Requests NONE Reflexed from [id number]    Gram Stain      FAIR SPECIMEN - 70-80% WBCS RARE WBC SEEN FEW GRAM POSITIVE COCCI IN PAIRS AND CHAINS    Culture PENDING    Report Status PENDING   Glucose, capillary     Status: Abnormal   Collection Time: 09/05/14  6:49 PM  Result Value Ref Range   Glucose-Capillary 171 (H) 65 - 99 mg/dL  Glucose, capillary     Status: Abnormal   Collection Time: 09/06/14 12:05 AM  Result Value Ref Range   Glucose-Capillary 108  (H) 65 - 99 mg/dL  Glucose, capillary     Status: Abnormal   Collection Time: 09/06/14  3:57 AM  Result Value Ref Range   Glucose-Capillary 118 (H) 65 - 99 mg/dL  CBC with Differential/Platelet     Status: Abnormal   Collection Time: 09/06/14  5:23 AM  Result Value Ref Range   WBC 9.9 3.6 - 11.0 K/uL   RBC 4.04 3.80 - 5.20 MIL/uL   Hemoglobin 11.8 (L) 12.0 - 16.0 g/dL   HCT 36.6 35.0 - 47.0 %   MCV 90.5 80.0 - 100.0 fL   MCH 29.3 26.0 - 34.0 pg   MCHC 32.4 32.0 - 36.0 g/dL   RDW 14.5 11.5 - 14.5 %   Platelets 170 150 - 440 K/uL   Neutrophils Relative % 84 %   Neutro Abs 8.3 (H) 1.4 - 6.5 K/uL   Lymphocytes Relative 10 %   Lymphs Abs 1.0 1.0 - 3.6 K/uL   Monocytes Relative 6 %   Monocytes Absolute 0.6 0.2 - 0.9 K/uL   Eosinophils Relative 0 %   Eosinophils Absolute 0.0 0 - 0.7 K/uL   Basophils Relative 0 %   Basophils Absolute 0.0 0 - 0.1 K/uL  Basic metabolic panel     Status: Abnormal   Collection Time: 09/06/14  5:23 AM  Result Value Ref Range   Sodium 136 135 - 145 mmol/L   Potassium 3.9 3.5 - 5.1 mmol/L   Chloride 104 101 - 111 mmol/L   CO2 27 22 - 32 mmol/L   Glucose, Bld 124 (H) 65 - 99 mg/dL   BUN 43 (H) 6 - 20 mg/dL   Creatinine, Ser 0.90 0.44 - 1.00 mg/dL   Calcium 8.0 (L) 8.9 - 10.3 mg/dL   GFR calc non Af Amer >60 >60 mL/min   GFR calc Af Amer >60 >60 mL/min    Comment: (NOTE) The eGFR has been calculated using the CKD EPI equation. This calculation has not been validated in all clinical situations. eGFR's persistently <60 mL/min signify possible Chronic Kidney Disease.    Anion gap 5 5 - 15  Magnesium     Status: Abnormal   Collection Time: 09/06/14  5:23 AM  Result Value Ref Range   Magnesium 2.6 (H) 1.7 - 2.4 mg/dL  Phosphorus     Status: None   Collection Time: 09/06/14  5:23 AM  Result Value Ref Range   Phosphorus 2.8 2.5 - 4.6 mg/dL  Glucose, capillary     Status: Abnormal   Collection Time: 09/06/14  7:25 AM  Result Value Ref Range    Glucose-Capillary 157 (H) 65 - 99 mg/dL    Dg Abd 1 View  09/04/2014   CLINICAL DATA:  69 year old female with a history of new orogastric tube placement.  EXAM: ABDOMEN - 1 VIEW  COMPARISON:  CT 09/04/2014, plain film 09/04/2014  FINDINGS: Limited plain film of the  abdomen.  Interval placement of gastric tube, with the side port terminating beyond the gastroesophageal junction.  IMPRESSION: Interval placement of gastric tube on this limited study, with the side port beyond the gastroesophageal junction.  Signed,  Dulcy Fanny. Earleen Newport, DO  [facility]  [facility]   Electronically Signed   By: Corrie Mckusick D.O.   On: 09/04/2014 17:24   Ct Angio Chest Pe W/cm &/or Wo Cm  09/04/2014   CLINICAL DATA:  Respiratory distress for 2 days  EXAM: CT ANGIOGRAPHY CHEST WITH CONTRAST  TECHNIQUE: Multidetector CT imaging of the chest was performed using the standard protocol during bolus administration of intravenous contrast. Multiplanar CT image reconstructions and MIPs were obtained to evaluate the vascular anatomy.  CONTRAST:  42mL OMNIPAQUE IOHEXOL 350 MG/ML SOLN  COMPARISON:  None.  FINDINGS: No filling defect in the pulmonary arterial tree to suggest acute pulmonary thromboembolism.  No abnormal mediastinal adenopathy. Coronary artery calcifications are noted. No pericardial effusion. Endotracheal and NG tubes are present. Atherosclerotic changes in the proximal left subclavian artery are present. There is gross patency of the great vessels including the vertebral arteries within the confines of the examination. No evidence of aortic dissection. Maximal diameter of the ascending aorta is 4.0 cm.  No pneumothorax.  No pleural effusion.  Severe emphysema with a prep collection for the upper lungs. Bilateral ill-defined centrilobular nodules and ground-glass opacities are present. The largest abnormality in the right upper lobe on image 79 measures 7 mm. Bibasilar  bronchiectasis.  No vertebral compression deformity.  Review of the MIP images confirms the above findings.  IMPRESSION: No evidence of acute pulmonary thromboembolism.  Bilateral tiny ill-defined nodules and ground-glass opacities most likely due to an inflammatory process. Three-month follow-up CT is recommended to ensure resolution of these findings. Initial follow-up by chest CT without contrast is recommended in 3 months to confirm persistence. This recommendation follows the consensus statement: Recommendations for the Management of Subsolid Pulmonary Nodules Detected at CT: A Statement from the [place] as published in Radiology 2013; 266:304-317.  Emphysema.  Endotracheal and NG tubes.  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 Recommend semi-annual imaging followup by CTA or MRA and referral to cardiothoracic surgery if not already obtained. This recommendation follows 2010 ACCF/AHA/AATS/ACR/ASA/SCA/SCAI/SIR/STS/SVM Guidelines for the Diagnosis and Management of Patients With Thoracic Aortic Disease. Circulation. 2010; 121: [id number]   Electronically Signed   By: Marybelle Killings M.D.   On: 09/04/2014 14:41   Dg Chest Port 1 View  09/05/2014   CLINICAL DATA:  Respiratory failure.  EXAM: PORTABLE CHEST - 1 VIEW  COMPARISON:  09/04/2014.  FINDINGS: Endotracheal tube, right PICC line, NG tube in stable position. Heart size normal. Lungs are clear. No pleural effusion or pneumothorax.  IMPRESSION: 1. Lines and tubes in stable position. 2. No acute cardiopulmonary disease identified. Chest appears stable from prior exam.   Electronically Signed   By: [place]   On: 09/05/2014 07:35   Dg Chest Port 1 View  09/05/2014   CLINICAL DATA:  PICC placement.  Initial encounter.  EXAM: PORTABLE CHEST - 1 VIEW  COMPARISON:  Chest radiograph and CTA of the chest performed earlier today at 12:19 p.m., and at 2:07 p.m.  FINDINGS: The patient's endotracheal tube is seen ending 2-3 cm above  the carina. An enteric tube is noted extending below the diaphragm.  The right PICC is noted ending about the mid SVC.  The lungs are well-aerated and  clear. There is no evidence of focal opacification, pleural effusion or pneumothorax.  The cardiomediastinal silhouette is within normal limits. No acute osseous abnormalities are seen.  IMPRESSION: 1. Right PICC noted ending about the mid SVC. 2. No acute cardiopulmonary process seen.   Electronically Signed   By: Garald Balding M.D.   On: 09/05/2014 00:06   Dg Chest Portable 1 View  09/04/2014   CLINICAL DATA:  Intubation  EXAM: PORTABLE CHEST - 1 VIEW  COMPARISON:  08/1914  FINDINGS: Interval intubation with endotracheal tube 5 cm from carina in good position. NG tube terminates in the mid mediastinum and deflected just left of the vertebral column. Cannot exclude endotracheal location of the NG tube.  Lungs are well inflated.  No pneumothorax.  No pulmonary edema.  IMPRESSION: 1. Endotracheal tube in good position. 2. Indeterminate location of the nasogastric tube which terminates in the mid mediastinum. Cannot exclude endotracheal course of the NG tube. Findings conveyed toREBECCA LORD on 09/04/2014  at12:38.   Electronically Signed   By: Suzy Bouchard M.D.   On: 09/04/2014 12:38   Dg Chest Port 1 View  09/04/2014   CLINICAL DATA:  Respiratory distress.  EXAM: PORTABLE CHEST - 1 VIEW  COMPARISON:  08/04/2012  FINDINGS: Borderline cardiomegaly. Pulmonary vascularity is within normal limits. Lungs are clear. No pneumothorax. No pleural effusion.  IMPRESSION: Borderline cardiomegaly without decompensation   Electronically Signed   By: Marybelle Killings M.D.   On: 09/04/2014 09:43    Review of Systems  Unable to perform ROS  Blood pressure 113/67, pulse 95, temperature 97.8 F (36.6 C), temperature source Tympanic, resp. rate 24, height $RemoveBe'5\' 5"'ZEVYHCgGr$  (1.651 m), weight 70.9 kg (156 lb 4.9 oz), SpO2 95 %. Physical Exam  Constitutional: She is oriented to person,  place, and time. She appears well-developed.  HENT:  Head: Normocephalic and atraumatic.  Right Ear: External ear normal.  Eyes: Conjunctivae are normal. Pupils are equal, round, and reactive to light.  Neck: Normal range of motion.  Cardiovascular: Normal rate, regular rhythm and normal heart sounds.   Respiratory: She is in respiratory distress. She has wheezes.  GI: Soft. Bowel sounds are normal.  Musculoskeletal: Normal range of motion.  Neurological: She is alert and oriented to person, place, and time.  Skin: Skin is warm and dry.  Psychiatric: She has a normal mood and affect.    Assessment/Plan:  respiratory failure  COPD exacerbation  shortness of breath  hypotension  elevated troponin  prior history of hypertension  urinary tract infection  cardiomegaly . PLAN  ventilatory support  pulmonary console and input  broad-spectrum antibiotic therapy  continue inhalers as necessary  GERD continue Pepcid consider Protonix  probable demand ischemia do not recommend cardiac catheterization   continue steroid taper  continue wean off of vent when possible  continue beta-blockade therapy for tachycardia  conservative medical therapy at this point   CALLWOOD,DWAYNE D. 09/06/2014, 8:15 AM

## 2014-09-07 DIAGNOSIS — J449 Chronic obstructive pulmonary disease, unspecified: Secondary | ICD-10-CM

## 2014-09-07 DIAGNOSIS — Z90710 Acquired absence of both cervix and uterus: Secondary | ICD-10-CM

## 2014-09-07 LAB — BLOOD GAS, ARTERIAL
ACID-BASE DEFICIT: 1 mmol/L (ref 0.0–2.0)
Acid-Base Excess: 10.3 mmol/L — ABNORMAL HIGH (ref 0.0–3.0)
BICARBONATE: 26.8 meq/L (ref 21.0–28.0)
Bicarbonate: 35.1 mEq/L — ABNORMAL HIGH (ref 21.0–28.0)
FIO2: 0.35 %
FIO2: 0.28 %
LHR: 24 {breaths}/min
MODE: POSITIVE
O2 Saturation: 97.2 %
O2 Saturation: 91.5 %
PEEP: 5 cmH2O
PEEP: 5 cmH2O
PRESSURE SUPPORT: 10 cmH2O
Patient temperature: 37
Patient temperature: 37
VT: 450 mL
pCO2 arterial: 57 mmHg — ABNORMAL HIGH (ref 32.0–48.0)
pCO2 arterial: 46 mmHg (ref 32.0–48.0)
pH, Arterial: 7.28 — ABNORMAL LOW (ref 7.350–7.450)
pH, Arterial: 7.49 — ABNORMAL HIGH (ref 7.350–7.450)
pO2, Arterial: 104 mmHg (ref 83.0–108.0)
pO2, Arterial: 57 mmHg — ABNORMAL LOW (ref 83.0–108.0)

## 2014-09-07 LAB — CBC WITH DIFFERENTIAL/PLATELET
BASOS ABS: 0.1 10*3/uL (ref 0–0.1)
Basophils Relative: 1 %
Eosinophils Absolute: 0 10*3/uL (ref 0–0.7)
Eosinophils Relative: 0 %
HCT: 38.1 % (ref 35.0–47.0)
Hemoglobin: 12.2 g/dL (ref 12.0–16.0)
LYMPHS PCT: 11 %
Lymphs Abs: 1.1 10*3/uL (ref 1.0–3.6)
MCH: 29 pg (ref 26.0–34.0)
MCHC: 32 g/dL (ref 32.0–36.0)
MCV: 90.6 fL (ref 80.0–100.0)
Monocytes Absolute: 0.5 10*3/uL (ref 0.2–0.9)
Monocytes Relative: 5 %
NEUTROS ABS: 8.6 10*3/uL — AB (ref 1.4–6.5)
Neutrophils Relative %: 83 %
PLATELETS: 181 10*3/uL (ref 150–440)
RBC: 4.21 MIL/uL (ref 3.80–5.20)
RDW: 14.5 % (ref 11.5–14.5)
WBC: 10.2 10*3/uL (ref 3.6–11.0)

## 2014-09-07 LAB — BASIC METABOLIC PANEL
Anion gap: 7 (ref 5–15)
BUN: 32 mg/dL — ABNORMAL HIGH (ref 6–20)
CHLORIDE: 104 mmol/L (ref 101–111)
CO2: 31 mmol/L (ref 22–32)
Calcium: 8.8 mg/dL — ABNORMAL LOW (ref 8.9–10.3)
Creatinine, Ser: 0.67 mg/dL (ref 0.44–1.00)
GFR calc Af Amer: >60 mL/min (ref >60)
GFR calc non Af Amer: >60 mL/min (ref >60)
GLUCOSE: 172 mg/dL — AB (ref 65–99)
POTASSIUM: 4 mmol/L (ref 3.5–5.1)
SODIUM: 142 mmol/L (ref 135–145)

## 2014-09-07 LAB — BLOOD GAS, VENOUS
Acid-Base Excess: 0.4 mmol/L (ref 0.0–3.0)
BICARBONATE: 32.4 meq/L — AB (ref 21.0–28.0)
Delivery systems: POSITIVE
FIO2: 0.3 %
O2 Saturation: 51.3 %
PEEP: 7 cmH2O
Patient temperature: 37
RATE: 12 resp/min
pCO2, Ven: 91 mmHg (ref 44.0–60.0)
pH, Ven: 7.16 — CL (ref 7.320–7.430)
pO2, Ven: 36 mmHg (ref 30.0–45.0)

## 2014-09-07 LAB — GLUCOSE, CAPILLARY
Glucose-Capillary: 109 mg/dL — ABNORMAL HIGH (ref 65–99)
Glucose-Capillary: 124 mg/dL — ABNORMAL HIGH (ref 65–99)
Glucose-Capillary: 146 mg/dL — ABNORMAL HIGH (ref 65–99)
Glucose-Capillary: 176 mg/dL — ABNORMAL HIGH (ref 65–99)
Glucose-Capillary: 181 mg/dL — ABNORMAL HIGH (ref 65–99)
Glucose-Capillary: 188 mg/dL — ABNORMAL HIGH (ref 65–99)
Glucose-Capillary: 246 mg/dL — ABNORMAL HIGH (ref 65–99)

## 2014-09-07 LAB — CULTURE, RESPIRATORY

## 2014-09-07 LAB — CULTURE, RESPIRATORY W GRAM STAIN

## 2014-09-07 MED ORDER — CEFTRIAXONE SODIUM IN DEXTROSE 20 MG/ML IV SOLN
1.0000 g | INTRAVENOUS | Status: AC
  Administered 2014-09-08 – 2014-09-11 (×4): 1 g via INTRAVENOUS
  Filled 2014-09-07 (×6): qty 50

## 2014-09-07 NOTE — Consult Note (Addendum)
Palliative Medicine Inpatient Consult Follow Up Note   Name: [name] [name] Date: 09/07/2014 MRN: [id number]  DOB: 12/04/1945  Referring Physician: Jaynie Crumble, MD  Palliative Care consult requested for this 69 y.o. female for goals of medical therapy in patient with COPD admitted with VDRF  [name] [name] is awake, on SBT. Appears calm. Nods head appropriately to questions. Family not present. [name]    REVIEW OF SYSTEMS:  Pt unable to provide ROS  CODE STATUS: Full code   PAST MEDICAL HISTORY: Past Medical History  Diagnosis Date  . COPD (chronic obstructive pulmonary disease)   . Hypertension     PAST SURGICAL HISTORY:  Past Surgical History  Procedure Laterality Date  . Abdominal hysterectomy      Vital Signs: BP 94/61 mmHg  Pulse 112  Temp(Src) 99.1 F (37.3 C) (Axillary)  Resp 12  Ht 5\' 5"  (1.651 m)  Wt 71 kg (156 lb 8.4 oz)  BMI 26.05 kg/m2  SpO2 95% Filed Weights   09/04/14 1433 09/06/14 0531 09/07/14 0500  Weight: 72.2 kg (159 lb 2.8 oz) 70.9 kg (156 lb 4.9 oz) 71 kg (156 lb 8.4 oz)    Estimated body mass index is 26.05 kg/(m^2) as calculated from the following:   Height as of this encounter: 5\' 5"  (1.651 m).   Weight as of this encounter: 71 kg (156 lb 8.4 oz).  PHYSICAL EXAM: General: Critically ill appearing HEENT: ETT in place Neck: Trachea midline  Cardiovascular: regular rhythm, tachycardic Pulmonary/Chest: fair air movement ant fields, no audible wheeze Abdominal: Soft, + bowel sounds GU: Foley present, clear yellow urine Extremities: trace edema BLE's Neurological: awake, moves extremities, follow commands Skin: no rashes Psychiatric: awake, calm   LABS: CBC:    Component Value Date/Time   WBC 10.2 09/07/2014 0734   WBC 6.3 08/05/2012 0450   HGB 12.2 09/07/2014 0734   HGB 13.6 08/05/2012 0450   HCT 38.1 09/07/2014 0734   HCT 40.0 08/05/2012 0450   PLT 181 09/07/2014 0734   PLT 241 08/05/2012 0450   MCV 90.6 09/07/2014 0734   MCV 87  08/05/2012 0450   NEUTROABS 8.6* 09/07/2014 0734   NEUTROABS 5.5 08/05/2012 0450   LYMPHSABS 1.1 09/07/2014 0734   LYMPHSABS 0.8* 08/05/2012 0450   MONOABS 0.5 09/07/2014 0734   MONOABS 0.1* 08/05/2012 0450   EOSABS 0.0 09/07/2014 0734   EOSABS 0.0 08/05/2012 0450   BASOSABS 0.1 09/07/2014 0734   BASOSABS 0.0 08/05/2012 0450   Comprehensive Metabolic Panel:    Component Value Date/Time   NA 142 09/07/2014 0734   NA 138 08/05/2012 0450   K 4.0 09/07/2014 0734   K 3.9 08/05/2012 0450   CL 104 09/07/2014 0734   CL 105 08/05/2012 0450   CO2 31 09/07/2014 0734   CO2 29 08/05/2012 0450   BUN 32* 09/07/2014 0734   BUN 21* 08/05/2012 0450   CREATININE 0.67 09/07/2014 0734   CREATININE 0.55* 08/05/2012 0450   GLUCOSE 172* 09/07/2014 0734   GLUCOSE 142* 08/05/2012 0450   CALCIUM 8.8* 09/07/2014 0734   CALCIUM 9.2 08/05/2012 0450   AST 34 09/04/2014 0853   ALT 23 09/04/2014 0853   ALKPHOS 85 09/04/2014 0853   BILITOT 0.6 09/04/2014 0853   PROT 7.8 09/04/2014 0853   ALBUMIN 4.0 09/04/2014 0853    IMPRESSION: [name] [name] is a 69 yo woman with PMH of COPD, tobacco use, osteoporosis. She was admitted 09/04/14 with hypercarbic respiratory failure requiring intubation. She also had ARF.  Pt is doing well with SBT. Plan is for exutubation today.   Family reports pt's baseline is active with no limitation from COPD. She cares for her 47 yo grandchild everyday without difficulty. However, spirometry from 04/05/14 shows FEV1 only 42% of predicted.   PLAN: As above   More than 50% of the visit was spent in counseling/coordination of care: YES  Time spent: 25 minutes   Pt extubated. Doing well. Respiratory status appears stable. Discussed with granddaughter at bedside.

## 2014-09-07 NOTE — Progress Notes (Signed)
PULMONARY / CRITICAL CARE MEDICINE   Name: [name] [name] MRN: [id number] DOB: 04/25/1945    ADMISSION DATE:  09/04/2014 CONSULTATION DATE:  09/04/14  REFERRING MD :  Dr. Clent Ridges    Subjective   Patient intubated,sedated, plan for SAT/SBT when sedation off and patient fully awake, TF's started yesterday. GPC in sputum and blood cultures waiting sens reports  On SAT/SBT -awaiting ABG results-will give trial of extubation today   SIGNIFICANT EVENTS  6/19 - intubated for hypercapenic respiratory failure in [facility] ED      PAST MEDICAL HISTORY    :  Past Medical History  Diagnosis Date  . COPD (chronic obstructive pulmonary disease)   . Hypertension    Past Surgical History  Procedure Laterality Date  . Abdominal hysterectomy     Prior to Admission medications   Medication Sig Start Date End Date Taking? Authorizing Provider  ADVAIR DISKUS 500-50 MCG/DOSE AEPB Inhale 1 puff into the lungs every 12 (twelve) hours. 08/09/14  Yes Historical Provider, MD  albuterol (PROVENTIL) (2.5 MG/3ML) 0.083% nebulizer solution Inhale 3 mLs into the lungs every 6 (six) hours as needed. For wheezing 02/15/14  Yes Historical Provider, MD  metoprolol tartrate (LOPRESSOR) 25 MG tablet Take 25 mg by mouth 2 (two) times daily. 08/05/14  Yes Historical Provider, MD  predniSONE (DELTASONE) 5 MG tablet Take 5 mg by mouth daily. 08/19/14  Yes Historical Provider, MD  VENTOLIN HFA 108 (90 BASE) MCG/ACT inhaler Inhale 2 puffs into the lungs every 6 (six) hours as needed. For wheezing 08/30/14  Yes Historical Provider, MD  Vitamin D, Ergocalciferol, (DRISDOL) 50000 UNITS CAPS capsule Take 50,000 Units by mouth once a week. 09/01/14  Yes Historical Provider, MD   No Known Allergies   FAMILY HISTORY   Family History  Problem Relation Age of Onset  . Hypertension Mother   . Melanoma Father       SOCIAL HISTORY    reports that she has quit smoking. She does not have any smokeless tobacco history on file.  She reports that she does not drink alcohol. Her drug history is not on file.  Review of Systems  Unable to perform ROS: critical illness   - unable to obtain, patient intubated and sedated    VITAL SIGNS    Temp:  [97.8 F (36.6 C)-99.1 F (37.3 C)] 99.1 F (37.3 C) (06/22 0700) Pulse Rate:  [93-117] 112 (06/22 0802) Resp:  [11-24] 12 (06/22 0802) BP: (88-153)/(56-99) 94/61 mmHg (06/22 0700) SpO2:  [93 %-97 %] 95 % (06/22 0800) FiO2 (%):  [28 %] 28 % (06/22 0804) Weight:  [156 lb 8.4 oz (71 kg)] 156 lb 8.4 oz (71 kg) (06/22 0500) HEMODYNAMICS:   VENTILATOR SETTINGS: Vent Mode:  [-] PRVC FiO2 (%):  [28 %] 28 % Set Rate:  [0 bmp-24 bmp] 0 bmp Vt Set:  [500 mL] 500 mL PEEP:  [5 cmH20] 5 cmH20 INTAKE / OUTPUT:  Intake/Output Summary (Last 24 hours) at 09/07/14 0958 Last data filed at 09/07/14 0600  Gross per 24 hour  Intake 1289.82 ml  Output   1250 ml  Net  39.82 ml       PHYSICAL EXAM   Physical Exam  Constitutional: No distress.  HENT:  Head: Normocephalic and atraumatic.  Eyes: Conjunctivae are normal. Pupils are equal, round, and reactive to light.  Cardiovascular:  No murmur heard. Pulmonary/Chest: No respiratory distress. She has wheezes. She has rales.  Abdominal: Soft. Bowel sounds are normal.  Musculoskeletal: Normal range  of motion.  Neurological:  GCS<8T  Skin: She is not diaphoretic.       LABS   LABS:  CBC  Recent Labs Lab 09/05/14 0421 09/06/14 0523 09/07/14 0734  WBC 6.2 9.9 10.2  HGB 12.9 11.8* 12.2  HCT 39.8 36.6 38.1  PLT 177 170 181   Coag's No results for input(s): APTT, INR in the last 168 hours. BMET  Recent Labs Lab 09/05/14 0421 09/06/14 0523 09/07/14 0734  NA 133* 136 142  K 3.8 3.9 4.0  CL 103 104 104  CO2 25 27 31   BUN 30* 43* 32*  CREATININE 1.36* 0.90 0.67  GLUCOSE 210* 124* 172*   Electrolytes  Recent Labs Lab 09/05/14 0421 09/06/14 0523 09/07/14 0734  CALCIUM 7.7* 8.0* 8.8*  MG  --   2.6*  --   PHOS  --  2.8  --    Sepsis Markers No results for input(s): LATICACIDVEN, PROCALCITON, O2SATVEN in the last 168 hours. ABG  Recent Labs Lab 09/04/14 1520 09/04/14 2020  PHART 7.28* 7.31*  PCO2ART 57* 49*  PO2ART 104 59*   Liver Enzymes  Recent Labs Lab 09/04/14 0853  AST 34  ALT 23  ALKPHOS 85  BILITOT 0.6  ALBUMIN 4.0   Cardiac Enzymes  Recent Labs Lab 09/04/14 0853 09/04/14 1938 09/05/14 0046  TROPONINI <0.03 0.38* 0.27*   Glucose  Recent Labs Lab 09/06/14 0725 09/06/14 1200 09/06/14 1841 09/06/14 2358 09/07/14 0420 09/07/14 0806  GLUCAP 157* 138* 194* 246* 176* 109*     Recent Results (from the past 240 hour(s))  Culture, blood (routine x 2)     Status: None (Preliminary result)   Collection Time: 09/04/14  8:53 AM  Result Value Ref Range Status   Specimen Description BLOOD RIGHT HAND  Final   Special Requests Normal  Final   Culture NO GROWTH 3 DAYS  Final   Report Status PENDING  Incomplete  Culture, blood (routine x 2)     Status: None (Preliminary result)   Collection Time: 09/04/14  9:17 AM  Result Value Ref Range Status   Specimen Description BLOOD RIGHT ARM  Final   Special Requests Normal  Final   Culture  Setup Time   Final    GRAM POSITIVE COCCI IN CLUSTERS AEROBIC BOTTLE ONLY CRITICAL RESULT CALLED TO, READ BACK BY AND VERIFIED WITH: KRISTY ALSTON AT 1544 6 20 16  CTJ    Culture   Final    COAGULASE NEGATIVE STAPHYLOCOCCUS AEROBIC BOTTLE ONLY POSSIBLE CONTAMINATION WITH SKIN FLORA    Report Status PENDING  Incomplete  Urine culture     Status: None   Collection Time: 09/04/14  1:41 PM  Result Value Ref Range Status   Specimen Description URINE, CLEAN CATCH  Final   Special Requests Normal  Final   Culture NO GROWTH 2 DAYS  Final   Report Status 09/06/2014 FINAL  Final  MRSA PCR Screening     Status: None   Collection Time: 09/04/14  5:50 PM  Result Value Ref Range Status   MRSA by PCR NEGATIVE NEGATIVE Final     Comment:        The GeneXpert MRSA Assay (FDA approved for NASAL specimens only), is one component of a comprehensive MRSA colonization surveillance program. It is not intended to diagnose MRSA infection nor to guide or monitor treatment for MRSA infections.   Culture, expectorated sputum-assessment     Status: None   Collection Time: 09/05/14  4:21 AM  Result Value  Ref Range Status   Specimen Description ENDOTRACHEAL  Final   Special Requests NONE  Final   Sputum evaluation THIS SPECIMEN IS ACCEPTABLE FOR SPUTUM CULTURE  Final   Report Status 09/05/2014 FINAL  Final  Culture, respiratory (NON-Expectorated)     Status: None (Preliminary result)   Collection Time: 09/05/14  4:21 AM  Result Value Ref Range Status   Specimen Description ENDOTRACHEAL  Final   Special Requests NONE Reflexed from [id number]  Final   Gram Stain   Final    FAIR SPECIMEN - 70-80% WBCS RARE WBC SEEN FEW GRAM POSITIVE COCCI IN PAIRS AND CHAINS    Culture PENDING  Incomplete   Report Status PENDING  Incomplete     Current facility-administered medications:  .  antiseptic oral rinse (CPC / CETYLPYRIDINIUM CHLORIDE 0.05%) solution 7 mL, 7 mL, Mouth Rinse, QID, Vishal Mungal, MD, 7 mL at 09/07/14 0416 .  aspirin chewable tablet 81 mg, 81 mg, Oral, Daily, Leotis Shames, MD, 81 mg at 09/06/14 1038 .  azithromycin (ZITHROMAX) 500 mg in dextrose 5 % 250 mL IVPB, 500 mg, Intravenous, Q24H, Erin Fulling, MD, 500 mg at 09/06/14 1827 .  budesonide (PULMICORT) nebulizer solution 0.5 mg, 0.5 mg, Nebulization, BID, Erin Fulling, MD, 0.5 mg at 09/07/14 0752 .  cefTRIAXone (ROCEPHIN) 2 g in dextrose 5 % 50 mL IVPB - Premix, 2 g, Intravenous, Q24H, Bertram Savin, RPH, 2 g at 09/06/14 1828 .  chlorhexidine (PERIDEX) 0.12 % solution 15 mL, 15 mL, Mouth Rinse, BID, Vishal Mungal, MD, 15 mL at 09/07/14 0725 .  docusate (COLACE) 50 MG/5ML liquid 100 mg, 100 mg, Oral, BID, Bertram Savin, RPH, 100 mg at 09/06/14 2203 .   enoxaparin (LOVENOX) injection 40 mg, 40 mg, Subcutaneous, Q24H, Erin Fulling, MD, 40 mg at 09/06/14 1259 .  famotidine (PEPCID) tablet 20 mg, 20 mg, Oral, BID, Bertram Savin, RPH, 20 mg at 09/06/14 2203 .  feeding supplement (PRO-STAT SUGAR FREE 64) liquid 30 mL, 30 mL, Oral, TID, Erin Fulling, MD, 30 mL at 09/06/14 2200 .  feeding supplement (VITAL 1.5 CAL) liquid 1,000 mL, 1,000 mL, Per Tube, Continuous, Erin Fulling, MD, Stopped at 09/07/14 0737 .  fentaNYL (SUBLIMAZE) injection 100 mcg, 100 mcg, Intravenous, Q2H PRN, Erin Fulling, MD, 100 mcg at 09/07/14 0233 .  fentaNYL in NS (24mcg/ml) infusion-PREMIX, 10 mcg/hr, Intravenous, Continuous, Stephanie Acre, MD, Stopped at 09/06/14 0730 .  free water 25 mL, 25 mL, Per Tube, 6 times per day, Erin Fulling, MD, 25 mL at 09/07/14 0800 .  ipratropium-albuterol (DUONEB) 0.5-2.5 (3) MG/3ML nebulizer solution 3 mL, 3 mL, Nebulization, Q4H, Gale Journey, MD, 3 mL at 09/07/14 0752 .  methylPREDNISolone sodium succinate (SOLU-MEDROL) 125 mg/2 mL injection 60 mg, 60 mg, Intravenous, Daily, Erin Fulling, MD, 60 mg at 09/06/14 1302 .  metoprolol tartrate (LOPRESSOR) tablet 25 mg, 25 mg, Oral, BID, Gale Journey, MD, 25 mg at 09/06/14 2203 .  midazolam (VERSED) injection 2-4 mg, 2-4 mg, Intravenous, Q2H PRN, Erin Fulling, MD, 2 mg at 09/07/14 0233 .  pneumococcal 23 valent vaccine (PNU-IMMUNE) injection 0.5 mL, 0.5 mL, Intramuscular, Tomorrow-1000, Gale Journey, MD, 0.5 mL at 09/05/14 0946 .  senna (SENOKOT) tablet 17.2 mg, 2 tablet, Oral, BID, Bertram Savin, RPH, 17.2 mg at 09/06/14 2203  IMAGING    No results found.    Indwelling Urinary Catheter continued, requirement due to   Reason to continue Indwelling Urinary Catheter for strict Intake/Output monitoring  for hemodynamic instability   Central Line continued, requirement due to   Reason to continue [employer] Monitoring of central venous pressure or other hemodynamic  parameters   Ventilator continued, requirement due to, resp failure    Ventilator Sedation RASS 0 to -1   Lines       ASSESSMENT/PLAN  69 yo female with PMHx of COPD, OA, HTN, admitted for acute resp failure from acute COPD exacerbation likely from acute bronchitis/atypical pneumonia.  PULMONARY A: AECOPD COPD Respiratory Failure Mechanical ventialtion P:   - continue with MV, wean as tolerated-attempt SAT/SBT today and assess for trial of extubation - VAP bundle   CARDIOVASCULAR P:  - cont to monitor hemodynamics - cont with BP monitoring.   RENAL P:   - ICU electrolyte replacement protocol - avoid nephrotoxic agents  GASTROINTESTINAL P:   -TF's on Hold for extubation - cont PPI for stress ulcer prophylaxis  INFECTIOUS A:  Atypical pneumonia P:   - cont with current antibiotics - follow up cultures.  BCx2  -Sputum pending-await sens reports  ENDOCRINE P:   - ICU hypoglycemic\Hyperglycemia protocol  NEUROLOGIC P:   -wean sedation -alert and oriented    I have personally obtained a history, examined the patient, evaluated laboratory and imaging results, formulated the assessment and plan and placed orders.  The Patient requires high complexity decision making for assessment and support, frequent evaluation and titration of therapies, application of advanced monitoring technologies and extensive interpretation of multiple databases. Critical Care Time devoted to patient care services described in this note is 45 minutes.   Overall, patient is critically ill, prognosis is guarded. Patient at high risk for cardiac arrest and death.       10/01/2014, 9:58 AM

## 2014-09-07 NOTE — Care Management Note (Signed)
Case Management Note  Patient Details  Name: [name] [name] MRN: [id number] Date of Birth: 12/10/1945  Subjective/Objective:   RNCM assessment.  Admitted with hypercarbic respiratory failure requiring intubation. HX: COPD. Extubated today. On 3L/Roundup now. No chronic O2. No home health services. Met with patient. 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 Prior to admission, pt was independent with adls and driving. Reports compliance with medications and inhalers. PCP, Dr. Ginette Pitman. Discussed home health services if needed and pt agreeable. No agency preference. Following.                Action/Plan:   Expected Discharge Date:                  Expected Discharge Plan:  [place]  In-House Referral:     Discharge planning Services  CM Consult  Post Acute Care Choice:  Home Health Choice offered to:  Patient  DME Arranged:    DME Agency:     HH Arranged:    [place] Agency:     Status of Service:  In process, will continue to follow  Medicare Important Message Given:  Yes Date Medicare IM Given:  09/07/14 Medicare IM give by:  Orvan July Date Additional Medicare IM Given:    Additional Medicare Important Message give by:     If discussed at [place] of Stay Meetings, dates discussed:    Additional Comments:  Jolly Mango, RN 09/07/2014, 11:17 AM

## 2014-09-07 NOTE — Procedures (Signed)
Extubation Procedure Note  Patient Details:   Name: [name] [name] DOB: [birth year] MRN: [id number]   Airway Documentation: pt. Was suctioned prior to extubation for a small amount of thin white secretions. She was extubated without incident to a 4L nasal cannula. She coughed and expectorated a lg. Amount of thick tan secretions. She is voicing and coughing. She was later decreased to 3L nasal cannula for a SaO2 = 96%. Extubated at 1030 09/06/17.    Evaluation  O2 sats:96Complications: No apparent complications Patient did tolerate procedure well. Bilateral Breath Sounds: Diminished, Inspiratory wheezes Suctioning: Oral Yes   Pardeep Pautz 09/07/2014, 1:47 PM

## 2014-09-07 NOTE — Progress Notes (Signed)
PT Cancellation Note  Patient Details Name: [name] [name] MRN: [id number] DOB: 17-Nov-1945   Cancelled Treatment:    Reason Eval/Treat Not Completed:  (See PT note for further details. ) PT hold on evaluation today due to recent extubation of pt at 10:30 AM this date and heart rate trending tachycardic. No imminent need for evaluation today per consultation with case management. Evaluation will be conducted tomorrow AM.     Benna Dunks 09/07/2014, 4:33 PM  Benna Dunks, SPT. [phone]

## 2014-09-07 NOTE — Progress Notes (Signed)
Patient rested well all of night. No sedation necessary, but during bath at 02:00, when turning, patient became very agitated, trying aggressively to put out ETT. 2 mg Versed and 100 mcg Fentanyl IV administered. Patient again resting comfortably.

## 2014-09-07 NOTE — Care Management Note (Signed)
Case Management Note  Patient Details  Name: [name] [name] MRN: [id number] Date of Birth: 1945-06-08  Subjective/Objective:  Met with daughter and grand daughter at bedside. Discussed discharge plan and they are in agreement with home health services if needed. Very supportive family.                   Action/Plan:   Expected Discharge Date:                  Expected Discharge Plan:  [place]  In-House Referral:     Discharge planning Services  CM Consult  Post Acute Care Choice:  Home Health Choice offered to:  Patient  DME Arranged:    DME Agency:     HH Arranged:    [place] Agency:     Status of Service:  In process, will continue to follow  Medicare Important Message Given:  Yes Date Medicare IM Given:  09/07/14 Medicare IM give by:  Orvan July Date Additional Medicare IM Given:    Additional Medicare Important Message give by:     If discussed at [place] of Stay Meetings, dates discussed:    Additional Comments:  Jolly Mango, RN 09/07/2014, 1:29 PM

## 2014-09-07 NOTE — Evaluation (Signed)
Clinical/Bedside Swallow Evaluation Patient Details  Name: [name] [name] MRN: [id number] Date of Birth: Jul 05, 1945  Today's Date: 09/07/2014 Time: SLP Start Time (ACUTE ONLY): 1515 SLP Stop Time (ACUTE ONLY): 1615 SLP Time Calculation (min) (ACUTE ONLY): 60 min  Past Medical History:  Past Medical History  Diagnosis Date  . COPD (chronic obstructive pulmonary disease)   . Hypertension    Past Surgical History:  Past Surgical History  Procedure Laterality Date  . Abdominal hysterectomy     HPI:  pt admitted w/ respiratory failure and COPD exacerbation. She required BiPAP then oral intubation. Pt was extubated this AM post ~3 days of intubation. Pt was given ~5 hours post extubation prior to swallow exam. In the meantime, oral care given, ice chips per MD order and NSG supervision. Pt is verbally conversive; she denied any trouble swallowing at home prior to this admission. She is A/O x3.   Assessment / Plan / Recommendation Clinical Impression  Pt appears at reduced risk for aspiration w/ trials of thin liquids, purees and soft solids when following general aspiration precautions. Pt was able to feed self. She stated she would feel better about her eating/mastication when she gets her dentures this PM from family. She is used to eating w/ them. Encouraged pt to choose soft foods; easy to eat foods; moistened well. Rec. a mech soft diet w/ general aspiration precautions; meds in Puree if any trouble swallowing w/ liquids. Discussed her vocal quality briefly and reviewed vocal hygiene recs. including rest and hydration. Pt agreed.     Aspiration Risk   (reduced )    Diet Recommendation Dysphagia 3 (Mech soft);Thin   Medication Administration: Whole meds with puree (if nec. ) Compensations: Slow rate;Small sips/bites (moisten foods)    Other  Recommendations Recommended Consults:  (n/a at this time) Oral Care Recommendations: Oral care BID;Oral care before and after PO;Patient  independent with oral care   Follow Up Recommendations       Frequency and Duration min 3x week  1 week   Pertinent Vitals/Pain denied    SLP Swallow Goals  see care plan   Swallow Study Prior Functional Status   lived at home independently    General Date of Onset: 09/04/14 Other Pertinent Information: pt admitted w/ respiratory failure and COPD exacerbation. She required BiPAP then oral intubation. Pt was extubated this AM post ~3 days of intubation. Pt was given ~5 hours post extubation prior to swallow exam. In the meantime, oral care given, ice chips per MD order and NSG supervision. Pt is verbally conversive; she denied any trouble swallowing at home prior to this admission. She is A/O x3. Type of Study: Bedside swallow evaluation Previous Swallow Assessment: none indicated Diet Prior to this Study: NPO (d/t oral intubation; regular at home) Temperature Spikes Noted: No Respiratory Status:  (3 liters;  WBC wnl) History of Recent Intubation: Yes Length of Intubations (days): 3 days Date extubated: 09/07/14 Behavior/Cognition: Alert;Cooperative;Pleasant mood (follows commands) Oral Cavity - Dentition: Edentulous (wears upper denture at home) Self-Feeding Abilities: Able to feed self;Needs assist Patient Positioning: Upright in bed Baseline Vocal Quality: Low vocal intensity;Hoarse (min. ) Volitional Cough: Strong Volitional Swallow: Able to elicit    Oral/Motor/Sensory Function Overall Oral Motor/Sensory Function: Appears within functional limits for tasks assessed Labial ROM: Within Functional Limits Labial Symmetry: Within Functional Limits Labial Strength: Within Functional Limits Lingual ROM: Within Functional Limits Lingual Symmetry: Within Functional Limits (grossly) Lingual Strength: Within Functional Limits Facial Symmetry: Within Functional Limits Velum:  Within Functional Limits Mandible: Within Functional Limits   Ice Chips Ice chips: Within functional  limits Presentation: Self Fed;Spoon (5 trials)   Thin Liquid Thin Liquid: Within functional limits Presentation: Cup;Straw;Self Fed (4 trials w/ each)    Nectar Thick Nectar Thick Liquid: Not tested   Honey Thick Honey Thick Liquid: Not tested   Puree Puree: Within functional limits Presentation: Spoon;Self Fed (6 trials)   Solid   GO    Solid: Impaired Presentation: Self Fed;Spoon (x2 trials) Oral Phase Impairments:  (pt uses upper dentures when eating for better mastcation) Oral Phase Functional Implications:  (slower bolus mashing) Pharyngeal Phase Impairments:  (none) Other Comments:  (family bringing upper dentures)       Meiling Hendriks 09/07/2014,3:42 PM

## 2014-09-07 NOTE — Progress Notes (Signed)
Patient ST on cardiac monitor. AC ventilation, synchrony with vent. No sedation, patient awakens easily. OG with TF 41/25 infusing. Right UE PICC. Foley catheter with adequate (600) UOP. POC for WUA and SBT, discussed with family.

## 2014-09-07 NOTE — Progress Notes (Signed)
Nutrition Follow-up    INTERVENTION:  Medical Food Supplement Therapy: if extubated and diet advanced, pt would likely benefit from nutritional supplement EN: if unable to extubate/pt requires reintubation, recommend restarting TF   NUTRITION DIAGNOSIS:  Inadequate oral intake related to acute illness as evidenced by NPO status. Being addressed via TF    GOAL:  Provide needs based on ASPEN/SCCM guidelines   MONITOR:   (Energy Intake, Pulmonary, EN, Digestive System, Electrolyte/Renal PRofile, Glucose Profile)   ASSESSMENT:  Pt on vent this AM with plan to extubate  EN: tolerating Vital 1.5 TF at rate of 41 ml/hr, on hold for plan to extubate  Digestive System: 300 mL stool documented this AM    Recent Labs Lab 09/05/14 0421 09/06/14 0523 09/07/14 0734  NA 133* 136 142  K 3.8 3.9 4.0  CL 103 104 104  CO2 25 27 31   BUN 30* 43* 32*  CREATININE 1.36* 0.90 0.67  CALCIUM 7.7* 8.0* 8.8*  MG  --  2.6*  --   PHOS  --  2.8  --   GLUCOSE 210* 124* 172*    Meds: reviewed  Height:  Ht Readings from Last 1 Encounters:  09/04/14 5\' 5"  (1.651 m)    Weight:  Wt Readings from Last 1 Encounters:  09/07/14 156 lb 8.4 oz (71 kg)    Ideal Body Weight:     Wt Readings from Last 10 Encounters:  09/07/14 156 lb 8.4 oz (71 kg)    BMI:  Body mass index is 26.05 kg/(m^2).  Estimated Nutritional Needs:  Kcal:  1792 kcals (BEE 1409, Ve: 12, Tmax: 37.6) using current wt of 72.2 kg  Protein:  108-144 g(1.5-2.0 g/kg)   Fluid:  1800-2160 mL (25-30 ml/kg)   Diet Order:  Diet NPO time specified  EDUCATION NEEDS:  No education needs identified at this time   Intake/Output Summary (Last 24 hours) at 09/07/14 1242 Last data filed at 09/07/14 0600  Gross per 24 hour  Intake 1289.82 ml  Output   1250 ml  Net  39.82 ml    HIGH Care Level  Romelle Starcher MS, RD, LDN [phone] Pager

## 2014-09-07 NOTE — Progress Notes (Signed)
PROGRESS NOTE  [name] [name] RAQ:[id number] DOB: June 01, 1945 DOA: 09/04/2014 PCP: Barbette Reichmann, MD  Subjective:  69 y/o f  With hx of COPD, HTN admitted with Acute Hypercapnic  Respiratory failure following episode of URI.[name] Also noted to have UTI CT Chest: Negative for PE Pt is alert and responding to questions    Consultants:  Dr. Tim Lair  Dr, Nydia Bouton   Objective: BP 94/61 mmHg  Pulse 112  Temp(Src) 99.1 F (37.3 C) (Axillary)  Resp 12  Ht 5\' 5"  (1.651 m)  Wt 71 kg (156 lb 8.4 oz)  BMI 26.05 kg/m2  SpO2 95%  Intake/Output Summary (Last 24 hours) at 09/07/14 0818 Last data filed at 09/07/14 0600  Gross per 24 hour  Intake 1289.82 ml  Output   1250 ml  Net  39.82 ml   Filed Weights   09/04/14 1433 09/06/14 0531 09/07/14 0500  Weight: 72.2 kg (159 lb 2.8 oz) 70.9 kg (156 lb 4.9 oz) 71 kg (156 lb 8.4 oz)    Exam: Critically ill  On vent   General:  Intubated   Cardiovascular: S1 S2  Respiratory: Clear to auscultation  Abdomen: Soft   Neuro:Non Focal   Data Reviewed: Basic Metabolic Panel:  Recent Labs Lab 09/04/14 0853 09/05/14 0421 09/06/14 0523 09/07/14 0734  NA 138 133* 136 142  K 4.8 3.8 3.9 4.0  CL 100* 103 104 104  CO2 29 25 27 31   GLUCOSE 168* 210* 124* 172*  BUN 11 30* 43* 32*  CREATININE 0.61 1.36* 0.90 0.67  CALCIUM 8.7* 7.7* 8.0* 8.8*  MG  --   --  2.6*  --   PHOS  --   --  2.8  --    Liver Function Tests:  Recent Labs Lab 09/04/14 0853  AST 34  ALT 23  ALKPHOS 85  BILITOT 0.6  PROT 7.8  ALBUMIN 4.0   No results for input(s): LIPASE, AMYLASE in the last 168 hours. No results for input(s): AMMONIA in the last 168 hours. CBC:  Recent Labs Lab 09/04/14 0853 09/05/14 0421 09/06/14 0523 09/07/14 0734  WBC 9.6 6.2 9.9 10.2  NEUTROABS  --   --  8.3* 8.6*  HGB 15.0 12.9 11.8* 12.2  HCT 46.7 39.8 36.6 38.1  MCV 91.9 91.2 90.5 90.6  PLT 203 177 170 181   Cardiac Enzymes:    Recent Labs Lab 09/04/14 0853  09/04/14 1938 09/05/14 0046  TROPONINI <0.03 0.38* 0.27*   BNP (last 3 results) No results for input(s): BNP in the last 8760 hours.  ProBNP (last 3 results) No results for input(s): PROBNP in the last 8760 hours.  CBG:  Recent Labs Lab 09/06/14 1200 09/06/14 1841 09/06/14 2358 09/07/14 0420 09/07/14 0806  GLUCAP 138* 194* 246* 176* 109*    Recent Results (from the past 240 hour(s))  Culture, blood (routine x 2)     Status: None (Preliminary result)   Collection Time: 09/04/14  8:53 AM  Result Value Ref Range Status   Specimen Description BLOOD RIGHT HAND  Final   Special Requests Normal  Final   Culture NO GROWTH 2 DAYS  Final   Report Status PENDING  Incomplete  Culture, blood (routine x 2)     Status: None (Preliminary result)   Collection Time: 09/04/14  9:17 AM  Result Value Ref Range Status   Specimen Description BLOOD RIGHT ARM  Final   Special Requests Normal  Final   Culture  Setup Time   Final  GRAM POSITIVE COCCI IN CLUSTERS AEROBIC BOTTLE ONLY CRITICAL RESULT CALLED TO, READ BACK BY AND VERIFIED WITH: KRISTY ALSTON AT 1544 CTJ    Culture   Final    COAGULASE NEGATIVE STAPHYLOCOCCUS AEROBIC BOTTLE ONLY POSSIBLE CONTAMINATION WITH SKIN FLORA    Report Status PENDING  Incomplete  Urine culture     Status: None   Collection Time: 09/04/14  1:41 PM  Result Value Ref Range Status   Specimen Description URINE, CLEAN CATCH  Final   Special Requests Normal  Final   Culture NO GROWTH 2 DAYS  Final   Report Status 09/06/2014 FINAL  Final  MRSA PCR Screening     Status: None   Collection Time: 09/04/14  5:50 PM  Result Value Ref Range Status   MRSA by PCR NEGATIVE NEGATIVE Final    Comment:        The GeneXpert MRSA Assay (FDA approved for NASAL specimens only), is one component of a comprehensive MRSA colonization surveillance program. It is not intended to diagnose MRSA infection nor to guide or monitor treatment for MRSA infections.    Culture, expectorated sputum-assessment     Status: None   Collection Time: 09/05/14  4:21 AM  Result Value Ref Range Status   Specimen Description ENDOTRACHEAL  Final   Special Requests NONE  Final   Sputum evaluation THIS SPECIMEN IS ACCEPTABLE FOR SPUTUM CULTURE  Final   Report Status 09/05/2014 FINAL  Final  Culture, respiratory (NON-Expectorated)     Status: None (Preliminary result)   Collection Time: 09/05/14  4:21 AM  Result Value Ref Range Status   Specimen Description ENDOTRACHEAL  Final   Special Requests NONE Reflexed from [id number]  Final   Gram Stain   Final    FAIR SPECIMEN - 70-80% WBCS RARE WBC SEEN FEW GRAM POSITIVE COCCI IN PAIRS AND CHAINS    Culture PENDING  Incomplete   Report Status PENDING  Incomplete     Studies: No results found.  Scheduled Meds: . antiseptic oral rinse  7 mL Mouth Rinse QID  . aspirin  81 mg Oral Daily  . azithromycin  500 mg Intravenous Q24H  . budesonide (PULMICORT) nebulizer solution  0.5 mg Nebulization BID  . cefTRIAXone (ROCEPHIN)  IV  2 g Intravenous Q24H  . chlorhexidine  15 mL Mouth Rinse BID  . docusate  100 mg Oral BID  . enoxaparin (LOVENOX) injection  40 mg Subcutaneous Q24H  . famotidine  20 mg Oral BID  . feeding supplement (PRO-STAT SUGAR FREE 64)  30 mL Oral TID  . free water  25 mL Per Tube 6 times per day  . ipratropium-albuterol  3 mL Nebulization Q4H  . methylPREDNISolone (SOLU-MEDROL) injection  60 mg Intravenous Daily  . metoprolol tartrate  25 mg Oral BID  . pneumococcal 23 valent vaccine  0.5 mL Intramuscular Tomorrow-1000  . senna  2 tablet Oral BID    Continuous Infusions: . feeding supplement (VITAL 1.5 CAL) Stopped (09/07/14 0737)  . fentaNYL infusion INTRAVENOUS Stopped (09/06/14 0730)    Assessment/Plan: Principal Problem: 1 Acute  Hypercapnic respiratory failure secondary to COPD exacerbation Continue SVN's and IV Solumedrol Sedation off- Appears ready for extubation Continue Antibiotics   3 HTN: On Metoprolol 4 Elevated Troponin: probable Demand ischemia- Cardiology following   Code Status: Full        Macy Lingenfelter   09/07/2014, 8:18 AM  LOS: 3 days

## 2014-09-07 NOTE — Plan of Care (Signed)
Problem: SLP Dysphagia Goals Goal: Misc Dysphagia Goal Pt will safely tolerate po diet of least restrictive consistency w/ no overt s/s of aspiration noted by Staff/pt/family x3 sessions.    

## 2014-09-07 NOTE — Progress Notes (Signed)
PT Cancellation Note  Patient Details Name: [name] [name] MRN: [id number] DOB: Jun 03, 1945   Cancelled Treatment:    Reason Eval/Treat Not Completed:  (See PT note for further details. ) PT hold on evaluation today due to recent extubation of pt at 10:30 AM this date and heart rate trending tachycardic. No imminent need for evaluation today. Evaluation will be conducted tomorrow AM.    Benna Dunks 09/07/2014, 3:13 PM  Benna Dunks, SPT. [phone]

## 2014-09-08 LAB — BASIC METABOLIC PANEL
Anion gap: 10 (ref 5–15)
BUN: 24 mg/dL — AB (ref 6–20)
CO2: 31 mmol/L (ref 22–32)
Calcium: 9.2 mg/dL (ref 8.9–10.3)
Chloride: 101 mmol/L (ref 101–111)
Creatinine, Ser: 0.6 mg/dL (ref 0.44–1.00)
GFR calc Af Amer: >60 mL/min (ref >60)
GFR calc non Af Amer: >60 mL/min (ref >60)
GLUCOSE: 112 mg/dL — AB (ref 65–99)
POTASSIUM: 4.5 mmol/L (ref 3.5–5.1)
Sodium: 142 mmol/L (ref 135–145)

## 2014-09-08 LAB — GLUCOSE, CAPILLARY
GLUCOSE-CAPILLARY: 118 mg/dL — AB (ref 65–99)
GLUCOSE-CAPILLARY: 112 mg/dL — AB (ref 65–99)
GLUCOSE-CAPILLARY: 146 mg/dL — AB (ref 65–99)
Glucose-Capillary: 101 mg/dL — ABNORMAL HIGH (ref 65–99)
Glucose-Capillary: 115 mg/dL — ABNORMAL HIGH (ref 65–99)

## 2014-09-08 LAB — MAGNESIUM: Magnesium: 2.3 mg/dL (ref 1.7–2.4)

## 2014-09-08 MED ORDER — ENSURE ENLIVE PO LIQD
237.0000 mL | Freq: Two times a day (BID) | ORAL | Status: DC
Start: 2014-09-08 — End: 2014-09-12
  Administered 2014-09-08 – 2014-09-12 (×8): 237 mL via ORAL

## 2014-09-08 MED ORDER — ALBUTEROL SULFATE (2.5 MG/3ML) 0.083% IN NEBU: 2.5000 mg | INHALATION_SOLUTION | Freq: Once | RESPIRATORY_TRACT | Status: DC

## 2014-09-08 MED ORDER — ALBUTEROL SULFATE (2.5 MG/3ML) 0.083% IN NEBU
2.5000 mg | INHALATION_SOLUTION | RESPIRATORY_TRACT | Status: DC
  Administered 2014-09-08 (×4): 2.5 mg via RESPIRATORY_TRACT
  Filled 2014-09-08 (×3): qty 3

## 2014-09-08 MED ORDER — TIOTROPIUM BROMIDE MONOHYDRATE 18 MCG IN CAPS
18.0000 ug | ORAL_CAPSULE | Freq: Every day | RESPIRATORY_TRACT | Status: DC
Start: 2014-09-08 — End: 2014-09-12
  Administered 2014-09-08 – 2014-09-12 (×5): 18 ug via RESPIRATORY_TRACT
  Filled 2014-09-08 (×2): qty 5

## 2014-09-08 MED ORDER — PREDNISONE 50 MG PO TABS
50.0000 mg | ORAL_TABLET | Freq: Every day | ORAL | Status: DC
  Administered 2014-09-09 – 2014-09-11 (×3): 50 mg via ORAL
  Filled 2014-09-08 (×3): qty 1

## 2014-09-08 MED ORDER — MOMETASONE FURO-FORMOTEROL FUM 200-5 MCG/ACT IN AERO
2.0000 | INHALATION_SPRAY | Freq: Two times a day (BID) | RESPIRATORY_TRACT | Status: DC
  Administered 2014-09-08 – 2014-09-12 (×9): 2 via RESPIRATORY_TRACT
  Filled 2014-09-08: qty 8.8

## 2014-09-08 MED ORDER — ALBUTEROL SULFATE (2.5 MG/3ML) 0.083% IN NEBU
2.5000 mg | INHALATION_SOLUTION | Freq: Four times a day (QID) | RESPIRATORY_TRACT | Status: DC
  Administered 2014-09-09 (×4): 2.5 mg via RESPIRATORY_TRACT
  Filled 2014-09-08 (×4): qty 3

## 2014-09-08 MED ORDER — ASPIRIN EC 81 MG PO TBEC
81.0000 mg | DELAYED_RELEASE_TABLET | Freq: Every day | ORAL | Status: DC
  Administered 2014-09-09 – 2014-09-12 (×4): 81 mg via ORAL
  Filled 2014-09-08 (×4): qty 1

## 2014-09-08 NOTE — Progress Notes (Signed)
Speech Language Pathology Treatment: Dysphagia  Patient Details Name: TALULA ISLAND MRN: [id number] DOB: 05/10/1945 Today's Date: 09/08/2014 Time: 8811-0315 SLP Time Calculation (min) (ACUTE ONLY): 35 min  Assessment / Plan / Recommendation Clinical Impression  Pt appears to be tolerating her current diet consistency w/ no overt s/s of aspiration noted during oral intake; NSG reported same during breakfast meal this AM. Pt stated she was eating "ok" w/out swallowing difficulty but stated she did not have much appetite. Pt appears at reduced risk for aspiration w/ current diet following general aspiration precautions and taking her time during meals to avoid any SOB or over-exertion sec. to her baseline respiratory status. Pt agreed stating she would "take her time" at meals. No further skilled ST Services indicated at this time. NSG to reconsult if nec. Pt and NSG agreed.    HPI Other Pertinent Information: pt is tolerating extubation; she is dyspneic upon exertion but recovers easily w/ rest. Pt has baseline COPD and was admitted w/ hypercarbic respiratory failure. Pt stated she is on multiple meds for her breathing at home. She stated she is "enjoying" her meals currently but doesn't have much appetite.   Pertinent Vitals Pain Assessment: No/denies pain  SLP Plan  All goals met    Recommendations Diet recommendations: Dysphagia 3 (mechanical soft);Thin liquid Liquids provided via: Cup;Straw Medication Administration: Whole meds with puree (as nec. ) Supervision: Patient able to self feed Compensations: Slow rate;Small sips/bites Postural Changes and/or Swallow Maneuvers: Seated upright 90 degrees              Oral Care Recommendations: Oral care BID;Oral care before and after PO;Patient independent with oral care Plan: All goals met    GO     Watson,Katherine 09/08/2014, 2:22 PM

## 2014-09-08 NOTE — Progress Notes (Signed)
Patient moved to room 116 by bed with [facility], orderly.

## 2014-09-08 NOTE — Plan of Care (Signed)
Problem: Discharge Progression Outcomes Goal: Discharge plan in place and appropriate Individualization of Care Pt prefers to be called [name] [name] Pt has history of COPD and HTN on home medications Pt is a HIGH Fall  Goal: Other Discharge Outcomes/Goals Pt transferred to room 116 from ICU today.  Oriented to room, pt without c/o pain, o2 sats 98% on 2lpm , pt noted with SOB with minimal exertion, pt instructed on Purse lip breathing techniques.

## 2014-09-08 NOTE — Progress Notes (Signed)
PROGRESS NOTE  [name] [name] IOX:[id number] DOB: [birth year] DOA: 09/04/2014 PCP: Barbette Reichmann, MD  Subjective:  69 y/o f  With hx of COPD, HTN admitted with Acute Hypercapnic  Respiratory failure following episode of URI.[name] Also noted to have UTI CT Chest: Negative for PE Extubated yesterday Doing well    Consultants:  Dr. Tim Lair  Dr. Nydia Bouton  Dr.Phifer   Objective: BP 134/83 mmHg  Pulse 116  Temp(Src) 98.3 F (36.8 C) (Oral)  Resp 20  Ht 5\' 5"  (1.651 m)  Wt 68.4 kg (150 lb 12.7 oz)  BMI 25.09 kg/m2  SpO2 99%  Intake/Output Summary (Last 24 hours) at 09/08/14 0817 Last data filed at 09/08/14 0520  Gross per 24 hour  Intake      0 ml  Output   1500 ml  Net  -1500 ml   Filed Weights   09/06/14 0531 09/07/14 0500 09/08/14 0500  Weight: 70.9 kg (156 lb 4.9 oz) 71 kg (156 lb 8.4 oz) 68.4 kg (150 lb 12.7 oz)    Exam:   General:  NAD  Cardiovascular: S1 S2  Respiratory: Clear to auscultation  Abdomen: Soft   Neuro:Non Focal   Data Reviewed: Basic Metabolic Panel:  Recent Labs Lab 09/04/14 0853 09/05/14 0421 09/06/14 0523 09/07/14 0734 09/08/14 0543  NA 138 133* 136 142 142  K 4.8 3.8 3.9 4.0 4.5  CL 100* 103 104 104 101  CO2 29 25 27 31 31   GLUCOSE 168* 210* 124* 172* 112*  BUN 11 30* 43* 32* 24*  CREATININE 0.61 1.36* 0.90 0.67 0.60  CALCIUM 8.7* 7.7* 8.0* 8.8* 9.2  MG  --   --  2.6*  --  2.3  PHOS  --   --  2.8  --   --    Liver Function Tests:  Recent Labs Lab 09/04/14 0853  AST 34  ALT 23  ALKPHOS 85  BILITOT 0.6  PROT 7.8  ALBUMIN 4.0   No results for input(s): LIPASE, AMYLASE in the last 168 hours. No results for input(s): AMMONIA in the last 168 hours. CBC:  Recent Labs Lab 09/04/14 0853 09/05/14 0421 09/06/14 0523 09/07/14 0734  WBC 9.6 6.2 9.9 10.2  NEUTROABS  --   --  8.3* 8.6*  HGB 15.0 12.9 11.8* 12.2  HCT 46.7 39.8 36.6 38.1  MCV 91.9 91.2 90.5 90.6  PLT 203 177 170 181   Cardiac Enzymes:    Recent  Labs Lab 09/04/14 0853 09/04/14 1938 09/05/14 0046  TROPONINI <0.03 0.38* 0.27*   BNP (last 3 results) No results for input(s): BNP in the last 8760 hours.  ProBNP (last 3 results) No results for input(s): PROBNP in the last 8760 hours.  CBG:  Recent Labs Lab 09/07/14 1610 09/07/14 1956 09/07/14 2348 09/08/14 0351 09/08/14 0718  GLUCAP 181* 188* 146* 118* 101*    Recent Results (from the past 240 hour(s))  Culture, blood (routine x 2)     Status: None (Preliminary result)   Collection Time: 09/04/14  8:53 AM  Result Value Ref Range Status   Specimen Description BLOOD RIGHT HAND  Final   Special Requests Normal  Final   Culture NO GROWTH 3 DAYS  Final   Report Status PENDING  Incomplete  Culture, blood (routine x 2)     Status: None (Preliminary result)   Collection Time: 09/04/14  9:17 AM  Result Value Ref Range Status   Specimen Description BLOOD RIGHT ARM  Final   Special Requests Normal  Final   Culture  Setup Time   Final    GRAM POSITIVE COCCI IN CLUSTERS AEROBIC BOTTLE ONLY CRITICAL RESULT CALLED TO, READ BACK BY AND VERIFIED WITH: KRISTY ALSTON AT 1544 CTJ    Culture   Final    COAGULASE NEGATIVE STAPHYLOCOCCUS AEROBIC BOTTLE ONLY POSSIBLE CONTAMINATION WITH SKIN FLORA    Report Status PENDING  Incomplete  Urine culture     Status: None   Collection Time: 09/04/14  1:41 PM  Result Value Ref Range Status   Specimen Description URINE, CLEAN CATCH  Final   Special Requests Normal  Final   Culture NO GROWTH 2 DAYS  Final   Report Status 09/06/2014 FINAL  Final  MRSA PCR Screening     Status: None   Collection Time: 09/04/14  5:50 PM  Result Value Ref Range Status   MRSA by PCR NEGATIVE NEGATIVE Final    Comment:        The GeneXpert MRSA Assay (FDA approved for NASAL specimens only), is one component of a comprehensive MRSA colonization surveillance program. It is not intended to diagnose MRSA infection nor to guide or monitor treatment  for MRSA infections.   Culture, expectorated sputum-assessment     Status: None   Collection Time: 09/05/14  4:21 AM  Result Value Ref Range Status   Specimen Description ENDOTRACHEAL  Final   Special Requests NONE  Final   Sputum evaluation THIS SPECIMEN IS ACCEPTABLE FOR SPUTUM CULTURE  Final   Report Status 09/05/2014 FINAL  Final  Culture, respiratory (NON-Expectorated)     Status: None   Collection Time: 09/05/14  4:21 AM  Result Value Ref Range Status   Specimen Description ENDOTRACHEAL  Final   Special Requests NONE Reflexed from [id number]  Final   Gram Stain   Final    FAIR SPECIMEN - 70-80% WBCS RARE WBC SEEN FEW GRAM POSITIVE COCCI IN PAIRS AND CHAINS    Culture LIGHT GROWTH SERRATIA MARCESCENS  Final   Report Status 09/07/2014 FINAL  Final   Organism ID, Bacteria SERRATIA MARCESCENS  Final      Susceptibility   Serratia marcescens - MIC*    CEFAZOLIN >=64 RESISTANT Resistant     CEFTRIAXONE <=1 SENSITIVE Sensitive     CIPROFLOXACIN <=0.25 SENSITIVE Sensitive     GENTAMICIN <=1 SENSITIVE Sensitive     TRIMETH/SULFA <=20 SENSITIVE Sensitive     CEFOXITIN 16 RESISTANT Resistant     * LIGHT GROWTH SERRATIA MARCESCENS     Studies: No results found.  Scheduled Meds: . aspirin  81 mg Oral Daily  . budesonide (PULMICORT) nebulizer solution  0.5 mg Nebulization BID  . cefTRIAXone (ROCEPHIN)  IV  1 g Intravenous Q24H  . enoxaparin (LOVENOX) injection  40 mg Subcutaneous Q24H  . famotidine  20 mg Oral BID  . feeding supplement (PRO-STAT SUGAR FREE 64)  30 mL Oral TID  . ipratropium-albuterol  3 mL Nebulization Q4H  . methylPREDNISolone (SOLU-MEDROL) injection  60 mg Intravenous Daily  . metoprolol tartrate  25 mg Oral BID  . pneumococcal 23 valent vaccine  0.5 mL Intramuscular Tomorrow-1000    Continuous Infusions: . feeding supplement (VITAL 1.5 CAL) Stopped (09/07/14 0737)    Assessment/Plan: Principal Problem: 1 Acute  Hypercapnic respiratory failure secondary  to COPD exacerbation and Bronchitis Continue SVN's and IV Solumedrol Sputum culture: Serratia Marcesans Continue Rocephin 2 HTN: On Metoprolol 3 Elevated Troponin: probable Demand ischemia 4 Stable  For transfer to floor   Code  Status: Full        [name] [name]   09/08/2014, 8:17 AM  LOS: 4 days

## 2014-09-08 NOTE — Evaluation (Signed)
Physical Therapy Evaluation Patient Details Name: [name] [name] MRN: [id number] DOB: [birth year] Today's Date: 09/08/2014   History of Present Illness  Pt is a 69 year old female who was admitted to the hospital for acute respiratory failure. Pt was intubated on 09/04/14 and extubated on 09/07/14.  Clinical Impression  Pt evaluated by PT this morning has history of COPD and HTN. Examination reveals that pt performs bed mobility and transfers (with RW) at moderate assist for lifting support and balance. Ambulation was performed to 40 ft with RW and CGA +2 for management of equipment. Pt needed verbal cues to perform pursed-lip breathing in order to manage her oxygen saturation, and she tended to sway during ambulation. Pt previously did not need assistive device to walk or transfer safely. Her primary physical deficits are decreased endurance and decreased balance from baseline. Pt will benefit from skilled PT in order to improve her deficits in order to safely and independently live at home.     Follow Up Recommendations SNF    Equipment Recommendations  Rolling walker with 5" wheels    Recommendations for Other Services       Precautions / Restrictions Precautions Precautions: Fall Restrictions Weight Bearing Restrictions: No      Mobility  Bed Mobility Overal bed mobility: Needs Assistance Bed Mobility: Supine to Sit     Supine to sit: Mod assist     General bed mobility comments: Pt requires mod assist for bed mobility for lifting support  Transfers Overall transfer level: Needs assistance Equipment used: Rolling walker (2 wheeled) Transfers: Sit to/from Stand Sit to Stand: Mod assist         General transfer comment: Pt requires mod assist +2 with RW for lifting support and balance correction.   Ambulation/Gait Ambulation/Gait assistance: Min guard;+2 safety/equipment Ambulation Distance (Feet): 40 Feet Assistive device: Rolling walker (2 wheeled) Gait  Pattern/deviations: Staggering left;Staggering right;Step-through pattern;Drifts right/left;Leaning posteriorly Gait velocity: decreased   General Gait Details: Pt ambulated with RW and CGA +2 for managment of equipment. Pt needs pursed-lip breathing cues for maintenance of O2 sats. She easily slips from the low 90s to mid 80s. Ambulation was attempted without RW, but patient was too unstable and would lose balance.   Stairs            Wheelchair Mobility    Modified Rankin (Stroke Patients Only)       Balance Overall balance assessment: Needs assistance Sitting-balance support: Bilateral upper extremity supported     Postural control: Posterior lean                                   Pertinent Vitals/Pain Pain Assessment: No/denies pain    Home Living Family/patient expects to be discharged to:: Private residence Living Arrangements: Children Available Help at Discharge: Family Type of Home: House Home Access: Stairs to enter;Level entry (1 step to enter) Entrance Stairs-Rails: None Entrance Stairs-Number of Steps: 1 Home Layout: One level Home Equipment: None Additional Comments: Pt lives with 62 year old daughter, who is not present during the day. Pt states that she can get somebody to be present during the day so that she is not alone during any period of the day.     Prior Function Level of Independence: Independent         Comments: Walks without assistance, community ambulator, drives car.      Hand Dominance  Extremity/Trunk Assessment   Upper Extremity Assessment: Overall WFL for tasks assessed           Lower Extremity Assessment: Overall WFL for tasks assessed         Communication   Communication: No difficulties  Cognition Arousal/Alertness: Awake/alert Behavior During Therapy: WFL for tasks assessed/performed Overall Cognitive Status: Within Functional Limits for tasks assessed                       General Comments      Exercises        Assessment/Plan    PT Assessment Patient needs continued PT services  PT Diagnosis Difficulty walking;Abnormality of gait   PT Problem List Decreased strength;Decreased activity tolerance;Decreased balance;Decreased mobility;Decreased knowledge of use of DME;Decreased safety awareness;Cardiopulmonary status limiting activity  PT Treatment Interventions DME instruction;Gait training;Stair training;Functional mobility training;Therapeutic activities;Therapeutic exercise;Balance training;Neuromuscular re-education;Patient/family education   PT Goals (Current goals can be found in the Care Plan section) Acute Rehab PT Goals Patient Stated Goal: to go home PT Goal Formulation: With patient Time For Goal Achievement: 09/22/14 Potential to Achieve Goals: Good    Frequency Min 2X/week   Barriers to discharge        Co-evaluation               End of Session Equipment Utilized During Treatment: Gait belt Activity Tolerance: Patient tolerated treatment well (Ambulation limited by oxysat drop) Patient left: in chair;with call bell/phone within reach Nurse Communication: Mobility status (oxysat response during mobility)         Time: 3212-2482 PT Time Calculation (min) (ACUTE ONLY): 33 min   Charges:   PT Evaluation $Initial PT Evaluation Tier I: 1 Procedure PT Treatments $Gait Training: 8-22 mins   PT G CodesBenna Dunks 09-10-2014, 2:13 PM  Benna Dunks, SPT. [phone]

## 2014-09-08 NOTE — Progress Notes (Signed)
Dr. Belia Heman and Dr. Marcello Fennel gave orders for patient to be transferred to floor with teley monitor.  [name]

## 2014-09-08 NOTE — Progress Notes (Signed)
Pt has rested well all shift, denies any sob, or any complaints of pain.

## 2014-09-08 NOTE — Clinical Social Work Note (Signed)
Clinical Social Work Assessment  Patient Details  Name: [name] [name] MRN: [id number] Date of Birth: [birth year]  Date of referral:  09/08/14               Reason for consult:  Facility Placement                Permission sought to share information with:  Family Supports Permission granted to share information::  Yes, Verbal Permission Granted  Name::      (Granddaughter and daughter)   Housing/Transportation Living arrangements for the past 2 months:  Single Family Home Source of Information:  Patient Patient Interpreter Needed:  None Criminal Activity/Legal Involvement Pertinent to Current Situation/Hospitalization:  No - Comment as needed Significant Relationships:  Adult Children, Other Family Members Lives with:  Adult Children Do you feel safe going back to the place where you live?  Yes Need for family participation in patient care:  Yes (Comment)  Care giving concerns:  None reported   Facilities manager / plan:  CSW received consult from PT regarding New SNF placement. CSW met with pt and granddaughter to address consult. CSW introduced herself and explained role of social work. CSW also explained process of discharging to SNF.   Pt and family declined SNF placement. MD is aware and agreeable to discharge plan.   FL2 is complete, in the event that SNF placement is needed.   CSW is signing off at this time. Please reconsult if a need arises prior to discharge.   Employment status:  Retired Forensic scientist:  Medicare PT Recommendations:  [place] / Referral to community resources:  [place]  Patient/Family's Response to care:  Pt and pt's granddaughter would like for pt to return home.   Patient/Family's Understanding of and Emotional Response to Diagnosis, Current Treatment, and Prognosis:  Pt is eager to return home with home health services.   Emotional Assessment Appearance:  Appears stated  age Attitude/Demeanor/Rapport:  Other (Appropriate) Affect (typically observed):  Adaptable Orientation:  Oriented to Self, Oriented to Place, Oriented to  Time, Oriented to Situation Alcohol / Substance use:  Never Used Psych involvement (Current and /or in the community):  No (Comment)  Discharge Needs  Concerns to be addressed:  Adjustment to Illness Readmission within the last 30 days:  No Current discharge risk:  Chronically ill Barriers to Discharge:  No Barriers Identified   Darden Dates, LCSW 09/08/2014, 3:59 PM

## 2014-09-08 NOTE — Evaluation (Signed)
Physical Therapy Evaluation Patient Details Name: [name] [name] MRN: [id number] DOB: [birth year] Today's Date: 09/08/2014   History of Present Illness  Pt is a 69 year old female who was admitted to the hospital for acute respiratory failure. Pt was intubated on 09/04/14 and extubated on 09/07/14.  Clinical Impression  Pt evaluated by PT this morning has history of COPD and HTN. Examination reveals that pt performs bed mobility and transfers (with RW) at moderate assist for lifting support and balance. Ambulation was performed to 40 ft with RW and CGA +2 for management of equipment. Pt needed verbal cues to perform pursed-lip breathing in order to manage her oxygen saturation, and she tended to sway during ambulation. Pt previously did not need assistive device to walk or transfer safely. Her primary physical deficits are decreased endurance and decreased balance from baseline. Pt will benefit from skilled PT in order to improve her deficits in order to safely and independently live at home.     Follow Up Recommendations SNF    Equipment Recommendations  Rolling walker with 5" wheels    Recommendations for Other Services       Precautions / Restrictions Precautions Precautions: Fall Restrictions Weight Bearing Restrictions: No      Mobility  Bed Mobility Overal bed mobility: Needs Assistance Bed Mobility: Supine to Sit     Supine to sit: Mod assist     General bed mobility comments: Pt requires mod assist for bed mobility for lifting support  Transfers Overall transfer level: Needs assistance Equipment used: Rolling walker (2 wheeled) Transfers: Sit to/from Stand Sit to Stand: Mod assist         General transfer comment: Pt requires mod assist +2 with RW for lifting support and balance correction.   Ambulation/Gait Ambulation/Gait assistance: Min guard;+2 safety/equipment Ambulation Distance (Feet): 40 Feet Assistive device: Rolling walker (2 wheeled) Gait  Pattern/deviations: Staggering left;Staggering right;Step-through pattern;Drifts right/left;Leaning posteriorly Gait velocity: decreased   General Gait Details: Pt ambulated with RW and CGA +2 for managment of equipment. Pt needs pursed-lip breathing cues for maintenance of O2 sats. She easily slips from the low 90s to mid 80s. Ambulation was attempted without RW, but patient was too unstable and would lose balance.   Stairs            Wheelchair Mobility    Modified Rankin (Stroke Patients Only)       Balance Overall balance assessment: Needs assistance Sitting-balance support: Bilateral upper extremity supported     Postural control: Posterior lean                                   Pertinent Vitals/Pain Pain Assessment: No/denies pain    Home Living Family/patient expects to be discharged to:: Private residence Living Arrangements: Children Available Help at Discharge: Family Type of Home: House Home Access: Stairs to enter;Level entry (1 step to enter) Entrance Stairs-Rails: None Entrance Stairs-Number of Steps: 1 Home Layout: One level Home Equipment: None Additional Comments: Pt lives with 46 year old daughter, who is not present during the day. Pt states that she can get somebody to be present during the day so that she is not alone during any period of the day.     Prior Function Level of Independence: Independent         Comments: Walks without assistance, community ambulator, drives car.      Hand Dominance  Extremity/Trunk Assessment   Upper Extremity Assessment: Overall WFL for tasks assessed           Lower Extremity Assessment: Overall WFL for tasks assessed         Communication   Communication: No difficulties  Cognition Arousal/Alertness: Awake/alert Behavior During Therapy: WFL for tasks assessed/performed Overall Cognitive Status: Within Functional Limits for tasks assessed                       General Comments      Exercises        Assessment/Plan    PT Assessment Patient needs continued PT services  PT Diagnosis Difficulty walking;Abnormality of gait   PT Problem List Decreased strength;Decreased activity tolerance;Decreased balance;Decreased mobility;Decreased knowledge of use of DME;Decreased safety awareness;Cardiopulmonary status limiting activity  PT Treatment Interventions DME instruction;Gait training;Stair training;Functional mobility training;Therapeutic activities;Therapeutic exercise;Balance training;Neuromuscular re-education;Patient/family education   PT Goals (Current goals can be found in the Care Plan section) Acute Rehab PT Goals Patient Stated Goal: to go home PT Goal Formulation: With patient Time For Goal Achievement: 09/22/14 Potential to Achieve Goals: Good    Frequency Min 2X/week   Barriers to discharge        Co-evaluation               End of Session       Nurse Communication: Mobility status (oxysat response during exercise)         Time: 1194-1740 PT Time Calculation (min) (ACUTE ONLY): 33 min   Charges:         PT G CodesBenna Dunks Sep 18, 2014, 11:52 AM  Benna Dunks, SPT. [phone]

## 2014-09-08 NOTE — Progress Notes (Signed)
RN made Dr. Marcello Fennel aware of diastolic blood pressure consistantly 88-105 this a.m.  Dr. Marcello Fennel stated "just keep an eye on it, I dont want to order anything right now."  [name] [name]

## 2014-09-08 NOTE — Consult Note (Signed)
Palliative Medicine Inpatient Consult Follow Up Note   Name: [name] [name] Date: 09/08/2014 MRN: [id number]  DOB: 07-03-1945  Referring Physician: Jaynie Crumble, MD  Palliative Care consult requested for this 69 y.o. female for goals of medical therapy in patient with COPD admitted with VDRF  [name] [name] is sitting up in chair, working with PT. Appears dyspneic from exertion but recovers with rest. Family not present.     REVIEW OF SYSTEMS:  Pain: None Dyspnea:  Yes Nausea/Vomiting:  No Diarrhea:  No Constipation:   No Depression:   No Anxiety:   No Fatigue:   Yes  CODE STATUS: Full code   PAST MEDICAL HISTORY: Past Medical History  Diagnosis Date  . COPD (chronic obstructive pulmonary disease)   . Hypertension     PAST SURGICAL HISTORY:  Past Surgical History  Procedure Laterality Date  . Abdominal hysterectomy      Vital Signs: BP 123/85 mmHg  Pulse 110  Temp(Src) 98.3 F (36.8 C) (Oral)  Resp 12  Ht 5\' 5"  (1.651 m)  Wt 68.4 kg (150 lb 12.7 oz)  BMI 25.09 kg/m2  SpO2 95% Filed Weights   09/06/14 0531 09/07/14 0500 09/08/14 0500  Weight: 70.9 kg (156 lb 4.9 oz) 71 kg (156 lb 8.4 oz) 68.4 kg (150 lb 12.7 oz)    Estimated body mass index is 25.09 kg/(m^2) as calculated from the following:   Height as of this encounter: 5\' 5"  (1.651 m).   Weight as of this encounter: 68.4 kg (150 lb 12.7 oz).  PHYSICAL EXAM: General: ill appearing HEENT: OP clear, [place] in place Neck: Trachea midline  Cardiovascular: regular rhythm, tachycardic Pulmonary/Chest: fair air movement ant fields, no audible wheeze Abdominal: Soft, + bowel sounds GU: Foley present, clear yellow urine Extremities: trace edema BLE's Neurological: grossly nonfocal Skin: no rashes Psychiatric: alert and oriented  LABS: CBC:    Component Value Date/Time   WBC 10.2 09/07/2014 0734   WBC 6.3 08/05/2012 0450   HGB 12.2 09/07/2014 0734   HGB 13.6 08/05/2012 0450   HCT 38.1 09/07/2014 0734   HCT  40.0 08/05/2012 0450   PLT 181 09/07/2014 0734   PLT 241 08/05/2012 0450   MCV 90.6 09/07/2014 0734   MCV 87 08/05/2012 0450   NEUTROABS 8.6* 09/07/2014 0734   NEUTROABS 5.5 08/05/2012 0450   LYMPHSABS 1.1 09/07/2014 0734   LYMPHSABS 0.8* 08/05/2012 0450   MONOABS 0.5 09/07/2014 0734   MONOABS 0.1* 08/05/2012 0450   EOSABS 0.0 09/07/2014 0734   EOSABS 0.0 08/05/2012 0450   BASOSABS 0.1 09/07/2014 0734   BASOSABS 0.0 08/05/2012 0450   Comprehensive Metabolic Panel:    Component Value Date/Time   NA 142 09/08/2014 0543   NA 138 08/05/2012 0450   K 4.5 09/08/2014 0543   K 3.9 08/05/2012 0450   CL 101 09/08/2014 0543   CL 105 08/05/2012 0450   CO2 31 09/08/2014 0543   CO2 29 08/05/2012 0450   BUN 24* 09/08/2014 0543   BUN 21* 08/05/2012 0450   CREATININE 0.60 09/08/2014 0543   CREATININE 0.55* 08/05/2012 0450   GLUCOSE 112* 09/08/2014 0543   GLUCOSE 142* 08/05/2012 0450   CALCIUM 9.2 09/08/2014 0543   CALCIUM 9.2 08/05/2012 0450   AST 34 09/04/2014 0853   ALT 23 09/04/2014 0853   ALKPHOS 85 09/04/2014 0853   BILITOT 0.6 09/04/2014 0853   PROT 7.8 09/04/2014 0853   ALBUMIN 4.0 09/04/2014 0853    IMPRESSION: [name] [name] is a 69  yo woman with PMH of COPD, tobacco use, osteoporosis. She was admitted 09/04/14 with hypercarbic respiratory failure requiring intubation. She also had ARF. She was extubated 6/22.   Pt is doing well post-extubation. Worked with PT today. Diet advanced. Transferring out of CCU.   PLAN: As above  REFERRALS TO BE ORDERED:  Chaplain   More than 50% of the visit was spent in counseling/coordination of care: YES  Time spent: 20 minutes

## 2014-09-08 NOTE — Progress Notes (Signed)
Report called to [facility] on 1C.

## 2014-09-08 NOTE — Progress Notes (Signed)
RN notified Dr. Belia Heman that Patient short of breath after moving from chair to bed and asking for breathing treatment. Patient alert with no visible labored breathing. MD gave order for one time albuterol neb. This RN notified tracy RN on 1C who is taking over patient's care.

## 2014-09-08 NOTE — Progress Notes (Signed)
Nutrition Follow-up    INTERVENTION:   Medical Food Supplement: recommend addition of Ensure Enlive po BID between meals, each supplement provides 350 kcal and 20 grams of protein.  Meals/Snacks: cater to pt preferences   NUTRITION DIAGNOSIS:  Inadequate oral intake related to acute illness as evidenced by NPO status. Being addressed as diet advanced post extubation, adding supplement   GOAL:  Patient will meet greater than or equal to 90% of their needs   MONITOR:   (Energy Intake, Anthropometrics, Digestive System, Electrolyte/Renal profile, Glucose Profile)  ASSESSMENT:  Pt s/p extubation  Diet Order: Dysphagia III, SLP following  Energy Intake: recorded po intake 10% at 10am   Recent Labs Lab 09/06/14 0523 09/07/14 0734 09/08/14 0543  NA 136 142 142  K 3.9 4.0 4.5  CL 104 104 101  CO2 27 31 31   BUN 43* 32* 24*  CREATININE 0.90 0.67 0.60  CALCIUM 8.0* 8.8* 9.2  MG 2.6*  --  2.3  PHOS 2.8  --   --   GLUCOSE 124* 172* 112*     Height:  Ht Readings from Last 1 Encounters:  09/04/14 5\' 5"  (1.651 m)    Weight:  Wt Readings from Last 1 Encounters:  09/08/14 150 lb 12.7 oz (68.4 kg)   Filed Weights   09/06/14 0531 09/07/14 0500 09/08/14 0500  Weight: 156 lb 4.9 oz (70.9 kg) 156 lb 8.4 oz (71 kg) 150 lb 12.7 oz (68.4 kg)     BMI:  Body mass index is 25.09 kg/(m^2).  Estimated Nutritional Needs:  Kcal:  1792 kcals (BEE 1409, Ve: 12, Tmax: 37.6) using current wt of 72.2 kg  Protein:  108-144 g(1.5-2.0 g/kg)   Fluid:  1800-2160 mL (25-30 ml/kg)   Diet Order:  DIET DYS 3 Room service appropriate?: Yes; Fluid consistency:: Thin  EDUCATION NEEDS:  No education needs identified at this time   Intake/Output Summary (Last 24 hours) at 09/08/14 1444 Last data filed at 09/08/14 1300  Gross per 24 hour  Intake    120 ml  Output   1250 ml  Net  -1130 ml    MODERATE Care Level  Romelle Starcher MS, RD, LDN [phone] Pager

## 2014-09-08 NOTE — Progress Notes (Signed)
   09/08/14 0845  Clinical Encounter Type  Visited With Patient and family together  Visit Type Follow-up;Spiritual support  Referral From Chaplain  Consult/Referral To Chaplain  Spiritual Encounters  Spiritual Needs Prayer  Stress Factors  Patient Stress Factors Other (Comment) (Praise for improving health)  Family Stress Factors Other (Comment) (Praise for improving health.)  Patient is very good spirits. Grateful for health improvements.  Chap. Blakelee Allington G. Kelly Eisler, ext. [phone]

## 2014-09-08 NOTE — Progress Notes (Signed)
PULMONARY / CRITICAL CARE MEDICINE   Name: [name] [name] MRN: [id number] DOB: Jul 27, 1945    ADMISSION DATE:  09/04/2014 CONSULTATION DATE:  09/04/14  REFERRING MD :  Dr. Clent Ridges    Subjective   Patient successfully extubated, on minimal oxygen, still with wheezes  Patient with serratia pneumonia  Plan to start spiriva and dulera today, continue BD therapy   SIGNIFICANT EVENTS  6/19 - intubated for hypercapenic respiratory failure in [facility] ED      PAST MEDICAL HISTORY    :  Past Medical History  Diagnosis Date  . COPD (chronic obstructive pulmonary disease)   . Hypertension    Past Surgical History  Procedure Laterality Date  . Abdominal hysterectomy     Prior to Admission medications   Medication Sig Start Date End Date Taking? Authorizing Provider  ADVAIR DISKUS 500-50 MCG/DOSE AEPB Inhale 1 puff into the lungs every 12 (twelve) hours. 08/09/14  Yes Historical Provider, MD  albuterol (PROVENTIL) (2.5 MG/3ML) 0.083% nebulizer solution Inhale 3 mLs into the lungs every 6 (six) hours as needed. For wheezing 02/15/14  Yes Historical Provider, MD  metoprolol tartrate (LOPRESSOR) 25 MG tablet Take 25 mg by mouth 2 (two) times daily. 08/05/14  Yes Historical Provider, MD  predniSONE (DELTASONE) 5 MG tablet Take 5 mg by mouth daily. 08/19/14  Yes Historical Provider, MD  VENTOLIN HFA 108 (90 BASE) MCG/ACT inhaler Inhale 2 puffs into the lungs every 6 (six) hours as needed. For wheezing 08/30/14  Yes Historical Provider, MD  Vitamin D, Ergocalciferol, (DRISDOL) 50000 UNITS CAPS capsule Take 50,000 Units by mouth once a week. 09/01/14  Yes Historical Provider, MD   No Known Allergies   FAMILY HISTORY   Family History  Problem Relation Age of Onset  . Hypertension Mother   . Melanoma Father       SOCIAL HISTORY    reports that she has quit smoking. She does not have any smokeless tobacco history on file. She reports that she does not drink alcohol. Her drug history is not on  file.  Review of Systems  Constitutional: Negative.   HENT: Negative.   Eyes: Negative.   Respiratory: Positive for shortness of breath and wheezing.   Cardiovascular: Negative.   Gastrointestinal: Negative.   Genitourinary: Negative.   Musculoskeletal: Negative.   Skin: Negative.   Neurological: Negative.   Endo/Heme/Allergies: Negative.   Psychiatric/Behavioral: Negative.    - unable to obtain, patient intubated and sedated    VITAL SIGNS    Temp:  [98.2 F (36.8 C)-98.9 F (37.2 C)] 98.3 F (36.8 C) (06/23 0726) Pulse Rate:  [97-117] 116 (06/23 0730) Resp:  [11-22] 20 (06/23 0730) BP: (128-168)/(71-112) 134/83 mmHg (06/23 0730) SpO2:  [90 %-100 %] 99 % (06/23 0755) FiO2 (%):  [3 %-32 %] 32 % (06/22 1300) Weight:  [150 lb 12.7 oz (68.4 kg)] 150 lb 12.7 oz (68.4 kg) (06/23 0500) HEMODYNAMICS:   VENTILATOR SETTINGS: Vent Mode:  [-]  FiO2 (%):  [3 %-32 %] 32 % INTAKE / OUTPUT:  Intake/Output Summary (Last 24 hours) at 09/08/14 4098 Last data filed at 09/08/14 0520  Gross per 24 hour  Intake      0 ml  Output   1500 ml  Net  -1500 ml       PHYSICAL EXAM   Physical Exam  Constitutional: She is oriented to person, place, and time. No distress.  HENT:  Head: Normocephalic and atraumatic.  Eyes: Conjunctivae are normal. Pupils are equal, round,  and reactive to light.  Cardiovascular:  No murmur heard. Pulmonary/Chest: No respiratory distress. She has wheezes. She has no rales.  Abdominal: Soft. Bowel sounds are normal.  Musculoskeletal: Normal range of motion.  Neurological: She is alert and oriented to person, place, and time.  Skin: Skin is warm and dry. She is not diaphoretic.       LABS   LABS:  CBC  Recent Labs Lab 09/05/14 0421 09/06/14 0523 09/07/14 0734  WBC 6.2 9.9 10.2  HGB 12.9 11.8* 12.2  HCT 39.8 36.6 38.1  PLT 177 170 181   Coag's No results for input(s): APTT, INR in the last 168 hours. BMET  Recent Labs Lab  09/06/14 0523 09/07/14 0734 09/08/14 0543  NA 136 142 142  K 3.9 4.0 4.5  CL 104 104 101  CO2 27 31 31   BUN 43* 32* 24*  CREATININE 0.90 0.67 0.60  GLUCOSE 124* 172* 112*   Electrolytes  Recent Labs Lab 09/06/14 0523 09/07/14 0734 09/08/14 0543  CALCIUM 8.0* 8.8* 9.2  MG 2.6*  --  2.3  PHOS 2.8  --   --    Sepsis Markers No results for input(s): LATICACIDVEN, PROCALCITON, O2SATVEN in the last 168 hours. ABG  Recent Labs Lab 09/04/14 1520 09/04/14 2020 09/07/14 0940  PHART 7.28* 7.31* 7.49*  PCO2ART 57* 49* 46  PO2ART 104 59* 57*   Liver Enzymes  Recent Labs Lab 09/04/14 0853  AST 34  ALT 23  ALKPHOS 85  BILITOT 0.6  ALBUMIN 4.0   Cardiac Enzymes  Recent Labs Lab 09/04/14 0853 09/04/14 1938 09/05/14 0046  TROPONINI <0.03 0.38* 0.27*   Glucose  Recent Labs Lab 09/07/14 1137 09/07/14 1610 09/07/14 1956 09/07/14 2348 09/08/14 0351 09/08/14 0718  GLUCAP 124* 181* 188* 146* 118* 101*     Recent Results (from the past 240 hour(s))  Culture, blood (routine x 2)     Status: None (Preliminary result)   Collection Time: 09/04/14  8:53 AM  Result Value Ref Range Status   Specimen Description BLOOD RIGHT HAND  Final   Special Requests Normal  Final   Culture NO GROWTH 3 DAYS  Final   Report Status PENDING  Incomplete  Culture, blood (routine x 2)     Status: None (Preliminary result)   Collection Time: 09/04/14  9:17 AM  Result Value Ref Range Status   Specimen Description BLOOD RIGHT ARM  Final   Special Requests Normal  Final   Culture  Setup Time   Final    GRAM POSITIVE COCCI IN CLUSTERS AEROBIC BOTTLE ONLY CRITICAL RESULT CALLED TO, READ BACK BY AND VERIFIED WITH: KRISTY ALSTON AT 1544 6 20 16  CTJ    Culture   Final    COAGULASE NEGATIVE STAPHYLOCOCCUS AEROBIC BOTTLE ONLY POSSIBLE CONTAMINATION WITH SKIN FLORA    Report Status PENDING  Incomplete  Urine culture     Status: None   Collection Time: 09/04/14  1:41 PM  Result Value  Ref Range Status   Specimen Description URINE, CLEAN CATCH  Final   Special Requests Normal  Final   Culture NO GROWTH 2 DAYS  Final   Report Status 09/06/2014 FINAL  Final  MRSA PCR Screening     Status: None   Collection Time: 09/04/14  5:50 PM  Result Value Ref Range Status   MRSA by PCR NEGATIVE NEGATIVE Final    Comment:        The GeneXpert MRSA Assay (FDA approved for NASAL specimens only), is  one component of a comprehensive MRSA colonization surveillance program. It is not intended to diagnose MRSA infection nor to guide or monitor treatment for MRSA infections.   Culture, expectorated sputum-assessment     Status: None   Collection Time: 09/05/14  4:21 AM  Result Value Ref Range Status   Specimen Description ENDOTRACHEAL  Final   Special Requests NONE  Final   Sputum evaluation THIS SPECIMEN IS ACCEPTABLE FOR SPUTUM CULTURE  Final   Report Status 09/05/2014 FINAL  Final  Culture, respiratory (NON-Expectorated)     Status: None   Collection Time: 09/05/14  4:21 AM  Result Value Ref Range Status   Specimen Description ENDOTRACHEAL  Final   Special Requests NONE Reflexed from [id number]  Final   Gram Stain   Final    FAIR SPECIMEN - 70-80% WBCS RARE WBC SEEN FEW GRAM POSITIVE COCCI IN PAIRS AND CHAINS    Culture LIGHT GROWTH SERRATIA MARCESCENS  Final   Report Status 09/07/2014 FINAL  Final   Organism ID, Bacteria SERRATIA MARCESCENS  Final      Susceptibility   Serratia marcescens - MIC*    CEFAZOLIN >=64 RESISTANT Resistant     CEFTRIAXONE <=1 SENSITIVE Sensitive     CIPROFLOXACIN <=0.25 SENSITIVE Sensitive     GENTAMICIN <=1 SENSITIVE Sensitive     TRIMETH/SULFA <=20 SENSITIVE Sensitive     CEFOXITIN 16 RESISTANT Resistant     * LIGHT GROWTH SERRATIA MARCESCENS     Current facility-administered medications:  .  albuterol (PROVENTIL) (2.5 MG/3ML) 0.083% nebulizer solution 2.5 mg, 2.5 mg, Nebulization, Q4H, Erin Fulling, MD .  aspirin chewable tablet 81 mg, 81  mg, Oral, Daily, Leotis Shames, MD, 81 mg at 09/06/14 1038 .  budesonide (PULMICORT) nebulizer solution 0.5 mg, 0.5 mg, Nebulization, BID, Erin Fulling, MD, 0.5 mg at 09/08/14 0752 .  cefTRIAXone (ROCEPHIN) 1 g in dextrose 5 % 50 mL IVPB - Premix, 1 g, Intravenous, Q24H, Erin Fulling, MD .  enoxaparin (LOVENOX) injection 40 mg, 40 mg, Subcutaneous, Q24H, Erin Fulling, MD, 40 mg at 09/07/14 1253 .  famotidine (PEPCID) tablet 20 mg, 20 mg, Oral, BID, Bertram Savin, RPH, 20 mg at 09/07/14 2200 .  feeding supplement (PRO-STAT SUGAR FREE 64) liquid 30 mL, 30 mL, Oral, TID, Erin Fulling, MD, 30 mL at 09/06/14 2200 .  feeding supplement (VITAL 1.5 CAL) liquid 1,000 mL, 1,000 mL, Per Tube, Continuous, Erin Fulling, MD, Stopped at 09/07/14 0737 .  methylPREDNISolone sodium succinate (SOLU-MEDROL) 125 mg/2 mL injection 60 mg, 60 mg, Intravenous, Daily, Erin Fulling, MD, 60 mg at 09/07/14 1105 .  metoprolol tartrate (LOPRESSOR) tablet 25 mg, 25 mg, Oral, BID, Gale Journey, MD, 25 mg at 09/07/14 2200 .  mometasone-formoterol (DULERA) 200-5 MCG/ACT inhaler 2 puff, 2 puff, Inhalation, BID, Erin Fulling, MD .  pneumococcal 23 valent vaccine (PNU-IMMUNE) injection 0.5 mL, 0.5 mL, Intramuscular, Tomorrow-1000, Gale Journey, MD, 0.5 mL at 09/05/14 0946 .  tiotropium (SPIRIVA) inhalation capsule 18 mcg, 18 mcg, Inhalation, Daily, Erin Fulling, MD  IMAGING    No results found.       ASSESSMENT/PLAN  69 yo female with PMHx of COPD, OA, HTN, admitted for acute resp failure from acute COPD exacerbation  from acute bronchitis/ pneumonia.  PULMONARY A: AECOPD COPD Respiratory Failure Mechanical ventialtion P:  -successfully extubated  -Oxygen as needed 88-92% -start spiriva and dulera -continue BD therapy    INFECTIOUS A:  Searratia pneumonia -continue abx as prescribed  OK to transfer to  Gen med floor   I have personally obtained a history, examined the patient, evaluated laboratory and  imaging results, formulated the assessment and plan and placed orders.  The Patient requires high complexity decision making for assessment and support, frequent evaluation and titration of therapies, application of advanced monitoring technologies and extensive interpretation of multiple databases.  Time devoted to patient care services described in this note is 25 minutes.    Lucie Leather, M.D. Pulmonary & Critical Care Medicine [facility] Medical Director Intensive Care Unit       09/08/2014, 9:18 AM

## 2014-09-09 LAB — CBC WITH DIFFERENTIAL/PLATELET
BASOS PCT: 0 %
Basophils Absolute: 0 10*3/uL (ref 0–0.1)
Eosinophils Absolute: 0 10*3/uL (ref 0–0.7)
Eosinophils Relative: 0 %
HCT: 43.2 % (ref 35.0–47.0)
Hemoglobin: 14.3 g/dL (ref 12.0–16.0)
LYMPHS ABS: 1.4 10*3/uL (ref 1.0–3.6)
Lymphocytes Relative: 14 %
MCH: 29.9 pg (ref 26.0–34.0)
MCHC: 33.1 g/dL (ref 32.0–36.0)
MCV: 90.4 fL (ref 80.0–100.0)
MONO ABS: 0.6 10*3/uL (ref 0.2–0.9)
Monocytes Relative: 6 %
NEUTROS ABS: 7.7 10*3/uL — AB (ref 1.4–6.5)
Neutrophils Relative %: 80 %
Platelets: 208 10*3/uL (ref 150–440)
RBC: 4.78 MIL/uL (ref 3.80–5.20)
RDW: 14 % (ref 11.5–14.5)
WBC: 9.7 10*3/uL (ref 3.6–11.0)

## 2014-09-09 LAB — BASIC METABOLIC PANEL
ANION GAP: 8 (ref 5–15)
BUN: 23 mg/dL — ABNORMAL HIGH (ref 6–20)
CO2: 33 mmol/L — ABNORMAL HIGH (ref 22–32)
Calcium: 8.9 mg/dL (ref 8.9–10.3)
Chloride: 97 mmol/L — ABNORMAL LOW (ref 101–111)
Creatinine, Ser: 0.58 mg/dL (ref 0.44–1.00)
GFR calc Af Amer: >60 mL/min (ref >60)
GLUCOSE: 102 mg/dL — AB (ref 65–99)
POTASSIUM: 4.6 mmol/L (ref 3.5–5.1)
SODIUM: 138 mmol/L (ref 135–145)

## 2014-09-09 LAB — CULTURE, BLOOD (ROUTINE X 2)
Culture: NO GROWTH
Special Requests: NORMAL
Special Requests: NORMAL

## 2014-09-09 LAB — GLUCOSE, CAPILLARY
GLUCOSE-CAPILLARY: 88 mg/dL (ref 65–99)
GLUCOSE-CAPILLARY: 128 mg/dL — AB (ref 65–99)
Glucose-Capillary: 104 mg/dL — ABNORMAL HIGH (ref 65–99)
Glucose-Capillary: 119 mg/dL — ABNORMAL HIGH (ref 65–99)
Glucose-Capillary: 123 mg/dL — ABNORMAL HIGH (ref 65–99)
Glucose-Capillary: 151 mg/dL — ABNORMAL HIGH (ref 65–99)

## 2014-09-09 MED ORDER — ALBUTEROL SULFATE (2.5 MG/3ML) 0.083% IN NEBU: 2.5000 mg | INHALATION_SOLUTION | RESPIRATORY_TRACT | Status: DC | PRN

## 2014-09-09 NOTE — Plan of Care (Signed)
Problem: Discharge Progression Outcomes Goal: Other Discharge Outcomes/Goals Outcome: Progressing Plan of Care Progress to Goals:   Pain:  No complaints of pain.  Hemo:  VSS. O2  At 2L sat's in 90's. IV abx continue.  Pt continues to get SOB with minimal exertion. Breathing treatments offer some relief. Activity:  Pt up to [facility] with 1 assist.

## 2014-09-09 NOTE — Progress Notes (Signed)
PROGRESS NOTE  [name] [name] NWG:[id number] DOB: February 13, 1946 DOA: 09/04/2014 PCP: Barbette Reichmann, MD  Subjective:  69 y/o f  With hx of COPD, HTN admitted with Acute Hypercapnic  Respiratory failure following episode of URI.[name] Also noted to have UTI CT Chest: Negative for PE Doing better  Sputum: Serratia     Consultants:  Dr. Tim Lair  Dr. Nydia Bouton  Dr.Phifer   Objective: BP 110/78 mmHg  Pulse 89  Temp(Src) 98.6 F (37 C) (Oral)  Resp 18  Ht 5\' 5"  (1.651 m)  Wt 66.588 kg (146 lb 12.8 oz)  BMI 24.43 kg/m2  SpO2 98%  Intake/Output Summary (Last 24 hours) at 09/09/14 0829 Last data filed at 09/08/14 1700  Gross per 24 hour  Intake    360 ml  Output    300 ml  Net     60 ml   Filed Weights   09/07/14 0500 09/08/14 0500 09/09/14 0407  Weight: 71 kg (156 lb 8.4 oz) 68.4 kg (150 lb 12.7 oz) 66.588 kg (146 lb 12.8 oz)    Exam:   General:  NAD  Cardiovascular: S1 S2  Respiratory: Clear to auscultation  Abdomen: Soft   Neuro:Non Focal   Data Reviewed: Basic Metabolic Panel:  Recent Labs Lab 09/05/14 0421 09/06/14 0523 09/07/14 0734 09/08/14 0543 09/09/14 0354  NA 133* 136 142 142 138  K 3.8 3.9 4.0 4.5 4.6  CL 103 104 104 101 97*  CO2 25 27 31 31  33*  GLUCOSE 210* 124* 172* 112* 102*  BUN 30* 43* 32* 24* 23*  CREATININE 1.36* 0.90 0.67 0.60 0.58  CALCIUM 7.7* 8.0* 8.8* 9.2 8.9  MG  --  2.6*  --  2.3  --   PHOS  --  2.8  --   --   --    Liver Function Tests:  Recent Labs Lab 09/04/14 0853  AST 34  ALT 23  ALKPHOS 85  BILITOT 0.6  PROT 7.8  ALBUMIN 4.0   No results for input(s): LIPASE, AMYLASE in the last 168 hours. No results for input(s): AMMONIA in the last 168 hours. CBC:  Recent Labs Lab 09/04/14 0853 09/05/14 0421 09/06/14 0523 09/07/14 0734 09/09/14 0354  WBC 9.6 6.2 9.9 10.2 9.7  NEUTROABS  --   --  8.3* 8.6* 7.7*  HGB 15.0 12.9 11.8* 12.2 14.3  HCT 46.7 39.8 36.6 38.1 43.2  MCV 91.9 91.2 90.5 90.6 90.4  PLT 203 177  170 181 208   Cardiac Enzymes:    Recent Labs Lab 09/04/14 0853 09/04/14 1938 09/05/14 0046  TROPONINI <0.03 0.38* 0.27*   BNP (last 3 results) No results for input(s): BNP in the last 8760 hours.  ProBNP (last 3 results) No results for input(s): PROBNP in the last 8760 hours.  CBG:  Recent Labs Lab 09/08/14 1614 09/08/14 2237 09/08/14 2359 09/09/14 0401 09/09/14 0728  GLUCAP 146* 115* 123* 104* 88    Recent Results (from the past 240 hour(s))  Culture, blood (routine x 2)     Status: None (Preliminary result)   Collection Time: 09/04/14  8:53 AM  Result Value Ref Range Status   Specimen Description BLOOD RIGHT HAND  Final   Special Requests Normal  Final   Culture NO GROWTH 3 DAYS  Final   Report Status PENDING  Incomplete  Culture, blood (routine x 2)     Status: None   Collection Time: 09/04/14  9:17 AM  Result Value Ref Range Status   Specimen Description BLOOD  RIGHT ARM  Final   Special Requests Normal  Final   Culture  Setup Time   Final    GRAM POSITIVE COCCI IN CLUSTERS AEROBIC BOTTLE ONLY CRITICAL RESULT CALLED TO, READ BACK BY AND VERIFIED WITH: KRISTY ALSTON AT 1544 CTJ    Culture   Final    COAGULASE NEGATIVE STAPHYLOCOCCUS AEROBIC BOTTLE ONLY POSSIBLE CONTAMINATION WITH SKIN FLORA    Report Status 09/09/2014 FINAL  Final  Urine culture     Status: None   Collection Time: 09/04/14  1:41 PM  Result Value Ref Range Status   Specimen Description URINE, CLEAN CATCH  Final   Special Requests Normal  Final   Culture NO GROWTH 2 DAYS  Final   Report Status 09/06/2014 FINAL  Final  MRSA PCR Screening     Status: None   Collection Time: 09/04/14  5:50 PM  Result Value Ref Range Status   MRSA by PCR NEGATIVE NEGATIVE Final    Comment:        The GeneXpert MRSA Assay (FDA approved for NASAL specimens only), is one component of a comprehensive MRSA colonization surveillance program. It is not intended to diagnose MRSA infection nor to  guide or monitor treatment for MRSA infections.   Culture, expectorated sputum-assessment     Status: None   Collection Time: 09/05/14  4:21 AM  Result Value Ref Range Status   Specimen Description ENDOTRACHEAL  Final   Special Requests NONE  Final   Sputum evaluation THIS SPECIMEN IS ACCEPTABLE FOR SPUTUM CULTURE  Final   Report Status 09/05/2014 FINAL  Final  Culture, respiratory (NON-Expectorated)     Status: None   Collection Time: 09/05/14  4:21 AM  Result Value Ref Range Status   Specimen Description ENDOTRACHEAL  Final   Special Requests NONE Reflexed from [id number]  Final   Gram Stain   Final    FAIR SPECIMEN - 70-80% WBCS RARE WBC SEEN FEW GRAM POSITIVE COCCI IN PAIRS AND CHAINS    Culture LIGHT GROWTH SERRATIA MARCESCENS  Final   Report Status 09/07/2014 FINAL  Final   Organism ID, Bacteria SERRATIA MARCESCENS  Final      Susceptibility   Serratia marcescens - MIC*    CEFAZOLIN >=64 RESISTANT Resistant     CEFTRIAXONE <=1 SENSITIVE Sensitive     CIPROFLOXACIN <=0.25 SENSITIVE Sensitive     GENTAMICIN <=1 SENSITIVE Sensitive     TRIMETH/SULFA <=20 SENSITIVE Sensitive     CEFOXITIN 16 RESISTANT Resistant     * LIGHT GROWTH SERRATIA MARCESCENS     Studies: No results found.  Scheduled Meds: . albuterol  2.5 mg Nebulization Once  . albuterol  2.5 mg Nebulization QID  . aspirin EC  81 mg Oral Daily  . budesonide (PULMICORT) nebulizer solution  0.5 mg Nebulization BID  . cefTRIAXone (ROCEPHIN)  IV  1 g Intravenous Q24H  . enoxaparin (LOVENOX) injection  40 mg Subcutaneous Q24H  . feeding supplement (ENSURE ENLIVE)  237 mL Oral BID BM  . metoprolol tartrate  25 mg Oral BID  . mometasone-formoterol  2 puff Inhalation BID  . pneumococcal 23 valent vaccine  0.5 mL Intramuscular Tomorrow-1000  . predniSONE  50 mg Oral Q breakfast  . tiotropium  18 mcg Inhalation Daily    Continuous Infusions:    Assessment/Plan: Principal Problem: 1 Acute  Hypercapnic respiratory  failure secondary to COPD exacerbation and Bronchitis Continue SVN's and Prednisone- Taper slowly Sputum culture: Serratia Marcesans Continue Rocephin Wean O2  as able  2 HTN: Controlled -On Metoprolol 3 Elevated Troponin: probable Demand ischemia 4 Physical Therapy    Code Status: Full        Filip Luten   09/09/2014, 8:29 AM  LOS: 5 days

## 2014-09-09 NOTE — Plan of Care (Signed)
Problem: Discharge Progression Outcomes Goal: Other Discharge Outcomes/Goals Pt remains sob with minimal movement started on po prednisone today, continues iv antibiotics, no c/o pain

## 2014-09-09 NOTE — Progress Notes (Signed)
PT Cancellation Note  Patient Details Name: [name] [name] MRN: [id number] DOB: [birth year]   Cancelled Treatment:    Reason Eval/Treat Not Completed: Other (comment) (Conflicts with breathing treat and lunch).  May try later today if available.   Ivar Drape 09/09/2014, 12:25 PM   Samul Dada, PT MS Acute Rehab Dept. Number: [id number] [id number] and [facility] [phone]

## 2014-09-10 LAB — BASIC METABOLIC PANEL
Anion gap: 6 (ref 5–15)
BUN: 32 mg/dL — ABNORMAL HIGH (ref 6–20)
CHLORIDE: 101 mmol/L (ref 101–111)
CO2: 32 mmol/L (ref 22–32)
CREATININE: 0.71 mg/dL (ref 0.44–1.00)
Calcium: 8.8 mg/dL — ABNORMAL LOW (ref 8.9–10.3)
GFR calc Af Amer: >60 mL/min (ref >60)
Glucose, Bld: 94 mg/dL (ref 65–99)
POTASSIUM: 4.3 mmol/L (ref 3.5–5.1)
Sodium: 139 mmol/L (ref 135–145)

## 2014-09-10 LAB — CBC WITH DIFFERENTIAL/PLATELET
BASOS ABS: 0 10*3/uL (ref 0–0.1)
BASOS PCT: 0 %
Eosinophils Absolute: 0 10*3/uL (ref 0–0.7)
Eosinophils Relative: 0 %
HEMATOCRIT: 44 % (ref 35.0–47.0)
HEMOGLOBIN: 14.4 g/dL (ref 12.0–16.0)
LYMPHS PCT: 18 %
Lymphs Abs: 1.7 10*3/uL (ref 1.0–3.6)
MCH: 29.7 pg (ref 26.0–34.0)
MCHC: 32.7 g/dL (ref 32.0–36.0)
MCV: 90.7 fL (ref 80.0–100.0)
Monocytes Absolute: 0.7 10*3/uL (ref 0.2–0.9)
Monocytes Relative: 7 %
NEUTROS ABS: 7 10*3/uL — AB (ref 1.4–6.5)
Neutrophils Relative %: 75 %
Platelets: 222 10*3/uL (ref 150–440)
RBC: 4.85 MIL/uL (ref 3.80–5.20)
RDW: 14.1 % (ref 11.5–14.5)
WBC: 9.4 10*3/uL (ref 3.6–11.0)

## 2014-09-10 LAB — GLUCOSE, CAPILLARY
GLUCOSE-CAPILLARY: 88 mg/dL (ref 65–99)
GLUCOSE-CAPILLARY: 101 mg/dL — AB (ref 65–99)
Glucose-Capillary: 117 mg/dL — ABNORMAL HIGH (ref 65–99)
Glucose-Capillary: 191 mg/dL — ABNORMAL HIGH (ref 65–99)
Glucose-Capillary: 86 mg/dL (ref 65–99)

## 2014-09-10 NOTE — Plan of Care (Signed)
Problem: Discharge Progression Outcomes Goal: Other Discharge Outcomes/Goals Plan of care progress to goal: Pain: denies pain Hemodynamically: VSS Activity: one assist up to bsc due to SOB

## 2014-09-10 NOTE — Progress Notes (Signed)
PROGRESS NOTE  [name] [name]:[id number] DOB: 03-04-1946 DOA: 09/04/2014 PCP: Barbette Reichmann, MD  Subjective:  69 y/o f  With hx of COPD, HTN admitted with Acute Hypercapnic  Respiratory failure following episode of URI.[name] Also noted to have UTI CT Chest: Negative for PE Doing better - less osb Sputum: Serratia     Consultants:  Dr. Tim Lair  Dr. Nydia Bouton  Dr.Phifer   Objective: BP 102/68 mmHg  Pulse 76  Temp(Src) 98.1 F (36.7 C) (Oral)  Resp 17  Ht  (1.651 m)  Wt 67.767 kg (149 lb 6.4 oz)  BMI 24.86 kg/m2  SpO2 98%  Intake/Output Summary (Last 24 hours) at 09/10/14 1211 Last data filed at 09/10/14 0800  Gross per 24 hour  Intake    600 ml  Output      0 ml  Net    600 ml   Filed Weights   09/08/14 0500 09/09/14 0407 09/10/14 0500  Weight: 68.4 kg (150 lb 12.7 oz) 66.588 kg (146 lb 12.8 oz) 67.767 kg (149 lb 6.4 oz)    Exam:   General:  NAD  Cardiovascular: S1 S2  Respiratory: Clear to auscultation  Abdomen: Soft   Neuro:Non Focal   Data Reviewed: Basic Metabolic Panel:  Recent Labs Lab 09/06/14 0523 09/07/14 0734 09/08/14 0543 09/09/14 0354 09/10/14 0454  NA 136 142 142 138 139  K 3.9 4.0 4.5 4.6 4.3  CL 104 104 101 97* 101  CO2 33* 32  GLUCOSE 124* 172* 112* 102* 94  BUN 43* 32* 24* 23* 32*  CREATININE 0.90 0.67 0.60 0.58 0.71  CALCIUM 8.0* 8.8* 9.2 8.9 8.8*  MG 2.6*  --  2.3  --   --   PHOS 2.8  --   --   --   --    Liver Function Tests:  Recent Labs Lab 09/04/14 0853  AST 34  ALT 23  ALKPHOS 85  BILITOT 0.6  PROT 7.8  ALBUMIN 4.0   No results for input(s): LIPASE, AMYLASE in the last 168 hours. No results for input(s): AMMONIA in the last 168 hours. CBC:  Recent Labs Lab 09/05/14 0421 09/06/14 0523 09/07/14 0734 09/09/14 0354 09/10/14 0454  WBC 6.2 9.9 10.2 9.7 9.4  NEUTROABS  --  8.3* 8.6* 7.7* 7.0*  HGB 12.9 11.8* 12.2 14.3 14.4  HCT 39.8 36.6 38.1 43.2 44.0  MCV 91.2 90.5 90.6 90.4 90.7   PLT 177 170 181 208 222   Cardiac Enzymes:    Recent Labs Lab 09/04/14 0853 09/04/14 1938 09/05/14 0046  TROPONINI <0.03 0.38* 0.27*   BNP (last 3 results) No results for input(s): BNP in the last 8760 hours.  ProBNP (last 3 results) No results for input(s): PROBNP in the last 8760 hours.  CBG:  Recent Labs Lab 09/09/14 2045 09/10/14 0001 09/10/14 0415 09/10/14 0719 09/10/14 1112  GLUCAP 119* 117* 88 86 101*    Recent Results (from the past 240 hour(s))  Culture, blood (routine x 2)     Status: None   Collection Time: 09/04/14  8:53 AM  Result Value Ref Range Status   Specimen Description BLOOD RIGHT HAND  Final   Special Requests Normal  Final   Culture NO GROWTH 5 DAYS  Final   Report Status 09/09/2014 FINAL  Final  Culture, blood (routine x 2)     Status: None   Collection Time: 09/04/14  9:17 AM  Result Value Ref Range Status   Specimen Description BLOOD  RIGHT ARM  Final   Special Requests Normal  Final   Culture  Setup Time   Final    GRAM POSITIVE COCCI IN CLUSTERS AEROBIC BOTTLE ONLY CRITICAL RESULT CALLED TO, READ BACK BY AND VERIFIED WITH: KRISTY ALSTON AT 1544 6 20 16  CTJ    Culture   Final    COAGULASE NEGATIVE STAPHYLOCOCCUS AEROBIC BOTTLE ONLY POSSIBLE CONTAMINATION WITH SKIN FLORA    Report Status 09/09/2014 FINAL  Final  Urine culture     Status: None   Collection Time: 09/04/14  1:41 PM  Result Value Ref Range Status   Specimen Description URINE, CLEAN CATCH  Final   Special Requests Normal  Final   Culture NO GROWTH 2 DAYS  Final   Report Status 09/06/2014 FINAL  Final  MRSA PCR Screening     Status: None   Collection Time: 09/04/14  5:50 PM  Result Value Ref Range Status   MRSA by PCR NEGATIVE NEGATIVE Final    Comment:        The GeneXpert MRSA Assay (FDA approved for NASAL specimens only), is one component of a comprehensive MRSA colonization surveillance program. It is not intended to diagnose MRSA infection nor to guide  or monitor treatment for MRSA infections.   Culture, expectorated sputum-assessment     Status: None   Collection Time: 09/05/14  4:21 AM  Result Value Ref Range Status   Specimen Description ENDOTRACHEAL  Final   Special Requests NONE  Final   Sputum evaluation THIS SPECIMEN IS ACCEPTABLE FOR SPUTUM CULTURE  Final   Report Status 09/05/2014 FINAL  Final  Culture, respiratory (NON-Expectorated)     Status: None   Collection Time: 09/05/14  4:21 AM  Result Value Ref Range Status   Specimen Description ENDOTRACHEAL  Final   Special Requests NONE Reflexed from [id number]  Final   Gram Stain   Final    FAIR SPECIMEN - 70-80% WBCS RARE WBC SEEN FEW GRAM POSITIVE COCCI IN PAIRS AND CHAINS    Culture LIGHT GROWTH SERRATIA MARCESCENS  Final   Report Status 09/07/2014 FINAL  Final   Organism ID, Bacteria SERRATIA MARCESCENS  Final      Susceptibility   Serratia marcescens - MIC*    CEFAZOLIN >=64 RESISTANT Resistant     CEFTRIAXONE <=1 SENSITIVE Sensitive     CIPROFLOXACIN <=0.25 SENSITIVE Sensitive     GENTAMICIN <=1 SENSITIVE Sensitive     TRIMETH/SULFA <=20 SENSITIVE Sensitive     CEFOXITIN 16 RESISTANT Resistant     * LIGHT GROWTH SERRATIA MARCESCENS     Studies: No results found.  Scheduled Meds: . albuterol  2.5 mg Nebulization Once  . aspirin EC  81 mg Oral Daily  . cefTRIAXone (ROCEPHIN)  IV  1 g Intravenous Q24H  . enoxaparin (LOVENOX) injection  40 mg Subcutaneous Q24H  . feeding supplement (ENSURE ENLIVE)  237 mL Oral BID BM  . metoprolol tartrate  25 mg Oral BID  . mometasone-formoterol  2 puff Inhalation BID  . pneumococcal 23 valent vaccine  0.5 mL Intramuscular Tomorrow-1000  . predniSONE  50 mg Oral Q breakfast  . tiotropium  18 mcg Inhalation Daily    Continuous Infusions:    Assessment/Plan: Principal Problem: 1 Acute  Hypercapnic respiratory failure secondary to COPD exacerbation and Bronchitis Continue SVN's and Prednisone- Taper slowly Sputum culture:  Serratia Marcesans Continue Rocephin Wean O2 as able  2 HTN: Controlled -On Metoprolol 3 Elevated Troponin: probable Demand ischemia 4 Physical Therapy  Code Status: Full        Delores Edelstein   09/10/2014, 12:11 PM  LOS: 6 days

## 2014-09-10 NOTE — Progress Notes (Signed)
Notified Dr. Sampson Goon of FSBS, 191, per CBG order.    Order to D/C FSBS.  [name] [name] 4:51 PM

## 2014-09-10 NOTE — Plan of Care (Signed)
Problem: Discharge Progression Outcomes Goal: Other Discharge Outcomes/Goals Outcome: Progressing Plan of care progress to goal: Pain: denies pain Hemodynamically: VSS Activity: one assist up to bsc due to SOB

## 2014-09-11 MED ORDER — PREDNISONE 20 MG PO TABS
40.0000 mg | ORAL_TABLET | Freq: Every day | ORAL | Status: DC
  Administered 2014-09-12: 40 mg via ORAL
  Filled 2014-09-11: qty 2

## 2014-09-11 MED ORDER — CETYLPYRIDINIUM CHLORIDE 0.05 % MT LIQD
7.0000 mL | Freq: Two times a day (BID) | OROMUCOSAL | Status: DC
  Administered 2014-09-11 – 2014-09-12 (×2): 7 mL via OROMUCOSAL

## 2014-09-11 NOTE — Plan of Care (Signed)
Problem: Discharge Progression Outcomes Goal: Other Discharge Outcomes/Goals Plan of Care Progress to Goal: PT REMAINS SOB ON EXERTION, SEE O2 RESULTS ON PREVIOUS NOTE, PT WITHOUT C/O PAIN, PT REMAINS ON IV ANTIBIOTICS AND PO PREDNISONE, PT FOR POSSIBLE D/C

## 2014-09-11 NOTE — Progress Notes (Signed)
   09/11/14 1341  Vitals  BP (!) 83/49 mmHg  Dr. Sampson Goon present, informed of BP and HR, no orders received at this time, will continue to assess

## 2014-09-11 NOTE — Plan of Care (Signed)
Problem: Discharge Progression Outcomes Goal: Discharge plan in place and appropriate Individualization of Care Outcome: Progressing Pt prefers to be called [name] [name] Pt has history of COPD and HTN on home medications Pt is a HIGH Fall   Goal: Other Discharge Outcomes/Goals Outcome: Progressing Plan of care progress to goal No c/o pain. VSS.O2 at 2 liters.  Still with some dyspnea on exertion. Up to [facility] one person assist. Possible discharge home tomorrow with home health.

## 2014-09-11 NOTE — Progress Notes (Signed)
   09/11/14 1210  Oxygen Therapy  SpO2 (!) 79 %  pt noted with 79% o2 sat on room air with exertion.  SAt 92% on room air at rest.  With o2 at 2 lpm pt sats are 97% at rest and 93% on exertion. Pt noted with extreme sob with exertion but recovers quickly with purse lip breathing.

## 2014-09-11 NOTE — Progress Notes (Signed)
PROGRESS NOTE  MAYRIM CRAYNE ZHY:[id number] DOB: 08-06-1945 DOA: 09/04/2014 PCP: Barbette Reichmann, MD  Subjective: 69 y/o f  With hx of COPD, HTN admitted with Acute Hypercapnic  Respiratory failure following episode of URI.[name] Also noted to have UTI CT Chest: Negative for PE Doing better - less sob but unable to come off O2 Sputum: Serratia  Consultants:  Dr. Tim Lair  Dr. Nydia Bouton  Dr.Phifer  Objective: BP 112/71 mmHg  Pulse 75  Temp(Src) 99.1 F (37.3 C) (Oral)  Resp 20  Ht 5\' 5"  (1.651 m)  Wt 68.947 kg (152 lb)  BMI 25.29 kg/m2  SpO2 97%  Intake/Output Summary (Last 24 hours) at 09/11/14 1206 Last data filed at 09/11/14 0800  Gross per 24 hour  Intake    240 ml  Output      0 ml  Net    240 ml   Filed Weights   09/09/14 0407 09/10/14 0500 09/11/14 0404  Weight: 66.588 kg (146 lb 12.8 oz) 67.767 kg (149 lb 6.4 oz) 68.947 kg (152 lb)    Exam:   General:  NAD  Cardiovascular: S1 S2  Respiratory: poor air movement, no wheeze, no rhonchi  Abdomen: Soft   Neuro:Non Focal   Data Reviewed: Basic Metabolic Panel:  Recent Labs Lab 09/06/14 0523 09/07/14 0734 09/08/14 0543 09/09/14 0354 09/10/14 0454  NA 136 142 142 138 139  K 3.9 4.0 4.5 4.6 4.3  CL 104 104 101 97* 101  CO2 27 31 31  33* 32  GLUCOSE 124* 172* 112* 102* 94  BUN 43* 32* 24* 23* 32*  CREATININE 0.90 0.67 0.60 0.58 0.71  CALCIUM 8.0* 8.8* 9.2 8.9 8.8*  MG 2.6*  --  2.3  --   --   PHOS 2.8  --   --   --   --    Liver Function Tests: No results for input(s): AST, ALT, ALKPHOS, BILITOT, PROT, ALBUMIN in the last 168 hours. No results for input(s): LIPASE, AMYLASE in the last 168 hours. No results for input(s): AMMONIA in the last 168 hours. CBC:  Recent Labs Lab 09/05/14 0421 09/06/14 0523 09/07/14 0734 09/09/14 0354 09/10/14 0454  WBC 6.2 9.9 10.2 9.7 9.4  NEUTROABS  --  8.3* 8.6* 7.7* 7.0*  HGB 12.9 11.8* 12.2 14.3 14.4  HCT 39.8 36.6 38.1 43.2 44.0  MCV 91.2 90.5 90.6 90.4  90.7  PLT 177 170 181 208 222   Cardiac Enzymes:    Recent Labs Lab 09/04/14 1938 09/05/14 0046  TROPONINI 0.38* 0.27*   CBG:  Recent Labs Lab 09/10/14 0001 09/10/14 0415 09/10/14 0719 09/10/14 1112 09/10/14 1614  GLUCAP 117* 88 86 101* 191*    Recent Results (from the past 240 hour(s))  Culture, blood (routine x 2)     Status: None   Collection Time: 09/04/14  8:53 AM  Result Value Ref Range Status   Specimen Description BLOOD RIGHT HAND  Final   Special Requests Normal  Final   Culture NO GROWTH 5 DAYS  Final   Report Status 09/09/2014 FINAL  Final  Culture, blood (routine x 2)     Status: None   Collection Time: 09/04/14  9:17 AM  Result Value Ref Range Status   Specimen Description BLOOD RIGHT ARM  Final   Special Requests Normal  Final   Culture  Setup Time   Final    GRAM POSITIVE COCCI IN CLUSTERS AEROBIC BOTTLE ONLY CRITICAL RESULT CALLED TO, READ BACK BY AND VERIFIED WITH: KRISTY ALSTON  AT 1544 CTJ    Culture   Final    COAGULASE NEGATIVE STAPHYLOCOCCUS AEROBIC BOTTLE ONLY POSSIBLE CONTAMINATION WITH SKIN FLORA    Report Status 09/09/2014 FINAL  Final  Urine culture     Status: None   Collection Time: 09/04/14  1:41 PM  Result Value Ref Range Status   Specimen Description URINE, CLEAN CATCH  Final   Special Requests Normal  Final   Culture NO GROWTH 2 DAYS  Final   Report Status 09/06/2014 FINAL  Final  MRSA PCR Screening     Status: None   Collection Time: 09/04/14  5:50 PM  Result Value Ref Range Status   MRSA by PCR NEGATIVE NEGATIVE Final    Comment:        The GeneXpert MRSA Assay (FDA approved for NASAL specimens only), is one component of a comprehensive MRSA colonization surveillance program. It is not intended to diagnose MRSA infection nor to guide or monitor treatment for MRSA infections.   Culture, expectorated sputum-assessment     Status: None   Collection Time: 09/05/14  4:21 AM  Result Value Ref Range Status    Specimen Description ENDOTRACHEAL  Final   Special Requests NONE  Final   Sputum evaluation THIS SPECIMEN IS ACCEPTABLE FOR SPUTUM CULTURE  Final   Report Status 09/05/2014 FINAL  Final  Culture, respiratory (NON-Expectorated)     Status: None   Collection Time: 09/05/14  4:21 AM  Result Value Ref Range Status   Specimen Description ENDOTRACHEAL  Final   Special Requests NONE Reflexed from [id number]  Final   Gram Stain   Final    FAIR SPECIMEN - 70-80% WBCS RARE WBC SEEN FEW GRAM POSITIVE COCCI IN PAIRS AND CHAINS    Culture LIGHT GROWTH SERRATIA MARCESCENS  Final   Report Status 09/07/2014 FINAL  Final   Organism ID, Bacteria SERRATIA MARCESCENS  Final      Susceptibility   Serratia marcescens - MIC*    CEFAZOLIN >=64 RESISTANT Resistant     CEFTRIAXONE <=1 SENSITIVE Sensitive     CIPROFLOXACIN <=0.25 SENSITIVE Sensitive     GENTAMICIN <=1 SENSITIVE Sensitive     TRIMETH/SULFA <=20 SENSITIVE Sensitive     CEFOXITIN 16 RESISTANT Resistant     * LIGHT GROWTH SERRATIA MARCESCENS     Studies: No results found.  Scheduled Meds: . albuterol  2.5 mg Nebulization Once  . aspirin EC  81 mg Oral Daily  . cefTRIAXone (ROCEPHIN)  IV  1 g Intravenous Q24H  . enoxaparin (LOVENOX) injection  40 mg Subcutaneous Q24H  . feeding supplement (ENSURE ENLIVE)  237 mL Oral BID BM  . metoprolol tartrate  25 mg Oral BID  . mometasone-formoterol  2 puff Inhalation BID  . pneumococcal 23 valent vaccine  0.5 mL Intramuscular Tomorrow-1000  . predniSONE  50 mg Oral Q breakfast  . tiotropium  18 mcg Inhalation Daily    Assessment/Plan: Principal Problem: 1 Acute  Hypercapnic respiratory failure secondary to COPD exacerbation and Bronchitis Continue SVN's and Prednisone- Taper slowly Sputum culture: Serratia Marcesans Continue Rocephin Wean O2 as able - with min exertion going down to 79% percent- may need Home O2 2 HTN: Controlled -On Metoprolol 3 Elevated Troponin: probable Demand ischemia 4  Physical Therapy Code Status: Full        Cassiel Fernandez   09/11/2014, 12:06 PM  LOS: 7 days

## 2014-09-12 MED ORDER — TIOTROPIUM BROMIDE MONOHYDRATE 18 MCG IN CAPS: 18.0000 ug | ORAL_CAPSULE | Freq: Every day | RESPIRATORY_TRACT | Status: AC

## 2014-09-12 MED ORDER — ASPIRIN 81 MG PO TBEC: 81.0000 mg | DELAYED_RELEASE_TABLET | Freq: Every day | ORAL | Status: DC

## 2014-09-12 MED ORDER — LEVOFLOXACIN 500 MG PO TABS: 500.0000 mg | ORAL_TABLET | Freq: Every day | ORAL | Status: DC

## 2014-09-12 MED ORDER — PREDNISONE 10 MG PO TABS: ORAL_TABLET | ORAL | Status: DC

## 2014-09-12 NOTE — Progress Notes (Signed)
Nutrition Follow-up   INTERVENTION:  Meals and Snacks: Cater to patient preferences Medical Food Supplement Therapy: recommend continuing Ensure as ordered, will send mixed with ice cream per pt preference; recommend continuing as able after discharge     NUTRITION DIAGNOSIS:  Inadequate oral intake related to acute illness as evidenced by NPO status; improved as pt on diet and tolerating well  GOAL:  Patient will meet greater than or equal to 90% of their needs; ongoing  MONITOR:   (Energy Intake, Anthropometrics, Digestive System, Electrolyte/Renal profile, Glucose Profile)  ASSESSMENT:  Pt likely discharge today per MD note. Pt reports feeling well this am on visit.  Diet Order: Dysphagia III, Thin liquids, Sodium restriction  Current Nutrition: Pt reports eating 100% of eggs and home fries this am with applesauce. Pt reports eating well and tolerating well. Pt reports picking softer/moist foods secondary to irritated throat.   Gastrointestinal Profile: Last BM: 6/25   Medications: prednisone  Electrolyte/Renal Profile and Glucose Profile:   Recent Labs Lab 09/06/14 0523  09/08/14 0543 09/09/14 0354 09/10/14 0454  NA 136  < > 142 138 139  K 3.9  < > 4.5 4.6 4.3  CL 104  < > 101 97* 101  CO2 27  < > 31 33* 32  BUN 43*  < > 24* 23* 32*  CREATININE 0.90  < > 0.60 0.58 0.71  CALCIUM 8.0*  < > 9.2 8.9 8.8*  MG 2.6*  --  2.3  --   --   PHOS 2.8  --   --   --   --   GLUCOSE 124*  < > 112* 102* 94  < > = values in this interval not displayed. Protein Profile: No results for input(s): ALBUMIN in the last 168 hours.   Weight Trend since Admission: Filed Weights   09/10/14 0500 09/11/14 0404 09/12/14 0500  Weight: 149 lb 6.4 oz (67.767 kg) 152 lb (68.947 kg) 151 lb 11.2 oz (68.811 kg)    BMI:  Body mass index is 25.24 kg/(m^2).  Skin:  Reviewed, no issues  Diet Order:  DIET DYS 3 Room service appropriate?: Yes; Fluid consistency:: Thin  EDUCATION  NEEDS:  No education needs identified at this time   Intake/Output Summary (Last 24 hours) at 09/12/14 1109 Last data filed at 09/12/14 0900  Gross per 24 hour  Intake    170 ml  Output      0 ml  Net    170 ml    LOW Care Level  Leda QuailAllyson Sahan Pen, RD, LDN Pager [phone]

## 2014-09-12 NOTE — Progress Notes (Signed)
PROGRESS NOTE  [name] [name] VWU:[id number] DOB: 02-Nov-1945 DOA: 09/04/2014 PCP: Barbette Reichmann, MD  Subjective: 69 y/o f  With hx of COPD, HTN admitted with Acute Hypercapnic  Respiratory failure following episode of URI.[name] [name] Also noted to have UTI CT Chest: Negative for PE Still short of breath with exertion and requiring O2 2 l n/c  Sputum: Serratia  Consultants:  Dr. Tim Lair  Dr. Nydia Bouton  Dr.Phifer  Objective: BP 114/67 mmHg  Pulse 72  Temp(Src) 99 F (37.2 C) (Oral)  Resp 18  Ht  (1.651 m)  Wt 68.811 kg (151 lb 11.2 oz)  BMI 25.24 kg/m2  SpO2 97%  Intake/Output Summary (Last 24 hours) at 09/12/14 0809 Last data filed at 09/11/14 1730  Gross per 24 hour  Intake     50 ml  Output      0 ml  Net     50 ml   Filed Weights   09/10/14 0500 09/11/14 0404 09/12/14 0500  Weight: 67.767 kg (149 lb 6.4 oz) 68.947 kg (152 lb) 68.811 kg (151 lb 11.2 oz)    Exam:   General:  NAD  Cardiovascular: S1 S2  Respiratory: poor air movement, no wheeze, no rhonchi  Abdomen: Soft   Neuro:Non Focal   Data Reviewed: Basic Metabolic Panel:  Recent Labs Lab 09/06/14 0523 09/07/14 0734 09/08/14 0543 09/09/14 0354 09/10/14 0454  NA 136 142 142 138 139  K 3.9 4.0 4.5 4.6 4.3  CL 104 104 101 97* 101  CO2 33* 32  GLUCOSE 124* 172* 112* 102* 94  BUN 43* 32* 24* 23* 32*  CREATININE 0.90 0.67 0.60 0.58 0.71  CALCIUM 8.0* 8.8* 9.2 8.9 8.8*  MG 2.6*  --  2.3  --   --   PHOS 2.8  --   --   --   --    Liver Function Tests: No results for input(s): AST, ALT, ALKPHOS, BILITOT, PROT, ALBUMIN in the last 168 hours. No results for input(s): LIPASE, AMYLASE in the last 168 hours. No results for input(s): AMMONIA in the last 168 hours. CBC:  Recent Labs Lab 09/06/14 0523 09/07/14 0734 09/09/14 0354 09/10/14 0454  WBC 9.9 10.2 9.7 9.4  NEUTROABS 8.3* 8.6* 7.7* 7.0*  HGB 11.8* 12.2 14.3 14.4  HCT 36.6 38.1 43.2 44.0  MCV 90.5 90.6 90.4 90.7  PLT 170 181 208  222   Cardiac Enzymes:   No results for input(s): CKTOTAL, CKMB, CKMBINDEX, TROPONINI in the last 168 hours. CBG:  Recent Labs Lab 09/10/14 0001 09/10/14 0415 09/10/14 0719 09/10/14 1112 09/10/14 1614  GLUCAP 117* 88 86 101* 191*    Recent Results (from the past 240 hour(s))  Culture, blood (routine x 2)     Status: None   Collection Time: 09/04/14  8:53 AM  Result Value Ref Range Status   Specimen Description BLOOD RIGHT HAND  Final   Special Requests Normal  Final   Culture NO GROWTH 5 DAYS  Final   Report Status 09/09/2014 FINAL  Final  Culture, blood (routine x 2)     Status: None   Collection Time: 09/04/14  9:17 AM  Result Value Ref Range Status   Specimen Description BLOOD RIGHT ARM  Final   Special Requests Normal  Final   Culture  Setup Time   Final    GRAM POSITIVE COCCI IN CLUSTERS AEROBIC BOTTLE ONLY CRITICAL RESULT CALLED TO, READ BACK BY AND VERIFIED WITH: KRISTY ALSTON AT 1544 6 20 16  CTJ    Culture   Final    COAGULASE NEGATIVE STAPHYLOCOCCUS AEROBIC BOTTLE ONLY POSSIBLE CONTAMINATION WITH SKIN FLORA    Report Status 09/09/2014 FINAL  Final  Urine culture     Status: None   Collection Time: 09/04/14  1:41 PM  Result Value Ref Range Status   Specimen Description URINE, CLEAN CATCH  Final   Special Requests Normal  Final   Culture NO GROWTH 2 DAYS  Final   Report Status 09/06/2014 FINAL  Final  MRSA PCR Screening     Status: None   Collection Time: 09/04/14  5:50 PM  Result Value Ref Range Status   MRSA by PCR NEGATIVE NEGATIVE Final    Comment:        The GeneXpert MRSA Assay (FDA approved for NASAL specimens only), is one component of a comprehensive MRSA colonization surveillance program. It is not intended to diagnose MRSA infection nor to guide or monitor treatment for MRSA infections.   Culture, expectorated sputum-assessment     Status: None   Collection Time: 09/05/14  4:21 AM  Result Value Ref Range Status   Specimen Description  ENDOTRACHEAL  Final   Special Requests NONE  Final   Sputum evaluation THIS SPECIMEN IS ACCEPTABLE FOR SPUTUM CULTURE  Final   Report Status 09/05/2014 FINAL  Final  Culture, respiratory (NON-Expectorated)     Status: None   Collection Time: 09/05/14  4:21 AM  Result Value Ref Range Status   Specimen Description ENDOTRACHEAL  Final   Special Requests NONE Reflexed from [id number]  Final   Gram Stain   Final    FAIR SPECIMEN - 70-80% WBCS RARE WBC SEEN FEW GRAM POSITIVE COCCI IN PAIRS AND CHAINS    Culture LIGHT GROWTH SERRATIA MARCESCENS  Final   Report Status 09/07/2014 FINAL  Final   Organism ID, Bacteria SERRATIA MARCESCENS  Final      Susceptibility   Serratia marcescens - MIC*    CEFAZOLIN >=64 RESISTANT Resistant     CEFTRIAXONE <=1 SENSITIVE Sensitive     CIPROFLOXACIN <=0.25 SENSITIVE Sensitive     GENTAMICIN <=1 SENSITIVE Sensitive     TRIMETH/SULFA <=20 SENSITIVE Sensitive     CEFOXITIN 16 RESISTANT Resistant     * LIGHT GROWTH SERRATIA MARCESCENS     Studies: No results found.  Scheduled Meds: . albuterol  2.5 mg Nebulization Once  . antiseptic oral rinse  7 mL Mouth Rinse BID  . aspirin EC  81 mg Oral Daily  . enoxaparin (LOVENOX) injection  40 mg Subcutaneous Q24H  . feeding supplement (ENSURE ENLIVE)  237 mL Oral BID BM  . metoprolol tartrate  25 mg Oral BID  . mometasone-formoterol  2 puff Inhalation BID  . pneumococcal 23 valent vaccine  0.5 mL Intramuscular Tomorrow-1000  . predniSONE  40 mg Oral Q breakfast  . tiotropium  18 mcg Inhalation Daily    Assessment/Plan: Principal Problem: 1 Acute  Hypercapnic respiratory failure secondary to COPD exacerbation and Bronchitis Continue SVN's and Prednisone- Taper slowly Sputum culture: Serratia Marcesans Continue Rocephin Will need Home )2  2 HTN: Controlled -On Metoprolol 3 Elevated Troponin: probable Demand ischemia 4 Physical Therapy Plan to d/c home later today on tapering schedule of prednisone and po  antibiotics Code Status: Full        Affan Callow   09/12/2014, 8:09 AM  LOS: 8 days

## 2014-09-12 NOTE — Care Management (Signed)
Spoke with Ms. Dalzell at the bedside. Discussed Home Health and Durable Medical equipment agencies. Chose [facility] because they can take care of all services. Will update Will Anderson, durable medical equipment representative for [facility]. Will update representative for Home Health services. [name] will be going to daughter, Serena's home. Address: [address], [place]. Telephone Number: [phone]. Daughter will transport. Discharge to home today per Dr. Marcello FennelHande. Gwenette GreetBrenda S Holland RN MSN Care Management [phone]

## 2014-09-12 NOTE — Progress Notes (Signed)
Pt and daughter given d/c instructions r/t activity, o2 therapy, followup appt and medications. Voiced understanding.  Pt and daughter given 3 prescriptions, pt d/c home via wheelchair on o2 at 2 lpm per orders escorted by staff and family

## 2014-09-12 NOTE — Care Management (Signed)
Important Message  Patient Details  Name: [name] [name] MRN: [id number] Date of Birth: 11/02/1945   Medicare Important Message Given:  Yes-second notification given    Verita SchneidersKathy A Allmond 09/12/2014, 2:02 PM

## 2014-09-12 NOTE — Progress Notes (Signed)
SATURATION QUALIFICATIONS: (This note is used to comply with regulatory documentation for home oxygen)  Patient Saturations on Room Air at Rest = 92%  Patient Saturations on Room Air while Ambulating = 79%  Patient Saturations on 2 Liters of oxygen while Ambulating = 93%  Please briefly explain why patient needs home oxygen:  COPD

## 2014-09-12 NOTE — Plan of Care (Signed)
Problem: Discharge Progression Outcomes Goal: Other Discharge Outcomes/Goals Outcome: Progressing Plan of Care Progress to Goal: PT REMAINS SOB ON EXERTION, 02 SATS REMAIN IN 90'S WITH 2 L , PT WITHOUT C/O PAIN, PT REMAINS ON IV ANTIBIOTICS AND PO PREDNISONE, PT FOR POSSIBLE D/C THIS MORNING

## 2014-09-12 NOTE — Discharge Summary (Signed)
Physician Discharge Summary  [name] [name] ZOX:[id number] DOB: [birth year] DOA: 09/04/2014  PCP: [name] Reichmann, MD  Admit date: 09/04/2014 Discharge date: 09/12/2014  Time spent: 35 minutes  Recommendations for Outpatient Follow-up:   1 D/c Home today. Home Health Nursing and PT  Call office to make appt in 1-2 weeks   Discharge Diagnoses:  Principal Problem: 1 Acute Hypercapnic respiratory failure. 2 Bronchitis secondary to Serratia Marcescens  3 HTN   Discharge Condition: Fair   Diet recommendation: 2 gms sodium  Filed Weights   09/10/14 0500 09/11/14 0404 09/12/14 0500  Weight: 67.767 kg (149 lb 6.4 oz) 68.947 kg (152 lb) 68.811 kg (151 lb 11.2 oz)    History of present illness:  [name] [name] is a 69 year old female history of COPD and hypertension presents to ED complaining of increasing shortness of breath, Patient had been complaining of symptoms consistent with upper respiratory tract infection and later developed acute respiratory distress and with her granddaughter called EMS patient was noted to be wheezing significantly she was initially started on BiPAP and subsequently patient decompensated and required endotracheal intubation. Patient was noted to be hypercarbic. Chest x-ray did not show evidence of pneumonia Pt did have elevated Troponin that was felt to be due to demand ischemia    Hospital Course: Patient was initially admitted to the CCU was seen in consultation by Dr. Tim [name]. Sputum culture grew Serratia marcescens. She was started on IV Rocephin, and also treated with nebulized broncho-therapy as well as IV Solu-Medrol. 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 There was also bilateral tiny ill-defined nodules and groundglass opacities more likely to be due to inflammatory process No evidence for PE Patient was extubated and subsequently transferred to the floor her shortness but gradually improved but she continued  to be hypoxemic. She is advised to complete tapering schedule of prednisone continue Bronchodilator therapy and follow me Dr. Marcello [name]  in which 2 weeks' time     Discharge Exam: Filed Vitals:   09/12/14 0455  BP: 114/67  Pulse: 72  Temp: 99 F (37.2 C)  Resp: 18   Hypoxemic with exertion General: NAD Cardiovascular: s1 S2 Respiratory: Clear to auscultaion  Discharge Instructions   Discharge Instructions    Diet - low sodium heart healthy    Complete by:  As directed      Discharge instructions    Complete by:  As directed   D/c Home today. Home Health Nursing and PT  Call office to make appt in 1-2 weeks     For home use only DME oxygen    Complete by:  As directed   Mode or (Route):  Nasal cannula  Liters per Minute:  2  Frequency:  Continuous (stationary and portable oxygen unit needed)  Oxygen conserving device:  Yes  Oxygen delivery system:  Gas     Increase activity slowly    Complete by:  As directed           Current Discharge Medication List    START taking these medications   Details  aspirin EC 81 MG EC tablet Take 1 tablet (81 mg total) by mouth daily. Qty: 30 tablet, Refills: 2    !! predniSONE (DELTASONE) 10 MG tablet Prednisone 10 mg tabs  Take 4 tabs po once daily for 3 days Take 3 tabs po once daily for 3 days Take 2 tabs po once daily for 3 days Take 1 tab po once daily for  3 days And then continue 5 mg tabs as  before  Total: 30 tablets Qty: 30 tablet, Refills: 0    tiotropium (SPIRIVA) 18 MCG inhalation capsule Place 1 capsule (18 mcg total) into inhaler and inhale daily. Qty: 30 capsule, Refills: 12     !! - Potential duplicate medications found. Please discuss with provider.    CONTINUE these medications which have NOT CHANGED   Details  ADVAIR DISKUS 500-50 MCG/DOSE AEPB Inhale 1 puff into the lungs every 12 (twelve) hours.    albuterol (PROVENTIL) (2.5 MG/3ML) 0.083% nebulizer solution Inhale 3 mLs into the lungs every 6 (six)  hours as needed. For wheezing    metoprolol tartrate (LOPRESSOR) 25 MG tablet Take 25 mg by mouth 2 (two) times daily.    !! predniSONE (DELTASONE) 5 MG tablet Take 5 mg by mouth daily.    VENTOLIN HFA 108 (90 BASE) MCG/ACT inhaler Inhale 2 puffs into the lungs every 6 (six) hours as needed. For wheezing    Vitamin D, Ergocalciferol, (DRISDOL) 50000 UNITS CAPS capsule Take 50,000 Units by mouth once a week.     !! - Potential duplicate medications found. Please discuss with provider.     No Known Allergies    The results of significant diagnostics from this hospitalization (including imaging, microbiology, ancillary and laboratory) are listed below for reference.    Significant Diagnostic Studies: Dg Abd 1 View  09/04/2014   CLINICAL DATA:  69 year old female with a history of new orogastric tube placement.  EXAM: ABDOMEN - 1 VIEW  COMPARISON:  CT 09/04/2014, plain film 09/04/2014  FINDINGS: Limited plain film of the abdomen.  Interval placement of gastric tube, with the side port terminating beyond the gastroesophageal junction.  IMPRESSION: Interval placement of gastric tube on this limited study, with the side port beyond the gastroesophageal junction.  Signed,  [name] [name]. [name] Ave, DO  [facility]  [facility]   Electronically Signed   By: [name] [name] D.O.   On: 09/04/2014 17:24   Ct Angio Chest Pe W/cm &/or Wo Cm  09/04/2014   CLINICAL DATA:  Respiratory distress for 2 days  EXAM: CT ANGIOGRAPHY CHEST WITH CONTRAST  TECHNIQUE: Multidetector CT imaging of the chest was performed using the standard protocol during bolus administration of intravenous contrast. Multiplanar CT image reconstructions and MIPs were obtained to evaluate the vascular anatomy.  CONTRAST:  75mL OMNIPAQUE IOHEXOL 350 MG/ML SOLN  COMPARISON:  None.  FINDINGS: No filling defect in the pulmonary arterial tree to suggest acute pulmonary thromboembolism.  No abnormal  mediastinal adenopathy. Coronary artery calcifications are noted. No pericardial effusion. Endotracheal and NG tubes are present. Atherosclerotic changes in the proximal left subclavian artery are present. There is gross patency of the great vessels including the vertebral arteries within the confines of the examination. No evidence of aortic dissection. Maximal diameter of the ascending aorta is 4.0 cm.  No pneumothorax.  No pleural effusion.  Severe emphysema with a prep collection for the upper lungs. Bilateral ill-defined centrilobular nodules and ground-glass opacities are present. The largest abnormality in the right upper lobe on image 79 measures 7 mm. Bibasilar bronchiectasis.  No vertebral compression deformity.  Review of the MIP images confirms the above findings.  IMPRESSION: No evidence of acute pulmonary thromboembolism.  Bilateral tiny ill-defined nodules and ground-glass opacities most likely due to an inflammatory process. Three-month follow-up CT is recommended to ensure resolution of these findings. Initial follow-up by chest CT without contrast is recommended  in 3 months to confirm persistence. This recommendation follows the consensus statement: Recommendations for the Management of Subsolid Pulmonary Nodules Detected at CT: A Statement from the Fleischner Society as published in Radiology 2013; 266:304-317.  Emphysema.  Endotracheal and NG tubes.  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 Recommend semi-annual imaging followup by CTA or MRA and referral to cardiothoracic surgery if not already obtained. This recommendation follows 2010 ACCF/AHA/AATS/ACR/ASA/SCA/SCAI/SIR/STS/SVM Guidelines for the Diagnosis and Management of Patients With Thoracic Aortic Disease. Circulation. 2010; 121: e266-e36   Electronically Signed   By: Jolaine Click M.D.   On: 09/04/2014 14:41   Dg Chest Port 1 View  09/05/2014   CLINICAL DATA:  Respiratory failure.  EXAM: PORTABLE CHEST - 1 VIEW   COMPARISON:  09/04/2014.  FINDINGS: Endotracheal tube, right PICC line, NG tube in stable position. Heart size normal. Lungs are clear. No pleural effusion or pneumothorax.  IMPRESSION: 1. Lines and tubes in stable position. 2. No acute cardiopulmonary disease identified. Chest appears stable from prior exam.   Electronically Signed   By: Maisie Fus  Register   On: 09/05/2014 07:35   Dg Chest Port 1 View  09/05/2014   CLINICAL DATA:  PICC placement.  Initial encounter.  EXAM: PORTABLE CHEST - 1 VIEW  COMPARISON:  Chest radiograph and CTA of the chest performed earlier today at 12:19 p.m., and at 2:07 p.m.  FINDINGS: The patient's endotracheal tube is seen ending 2-3 cm above the carina. An enteric tube is noted extending below the diaphragm.  The right PICC is noted ending about the mid SVC.  The lungs are well-aerated and clear. There is no evidence of focal opacification, pleural effusion or pneumothorax.  The cardiomediastinal silhouette is within normal limits. No acute osseous abnormalities are seen.  IMPRESSION: 1. Right PICC noted ending about the mid SVC. 2. No acute cardiopulmonary process seen.   Electronically Signed   By: Roanna Raider M.D.   On: 09/05/2014 00:06   Dg Chest Portable 1 View  09/04/2014   CLINICAL DATA:  Intubation  EXAM: PORTABLE CHEST - 1 VIEW  COMPARISON:  08/1914  FINDINGS: Interval intubation with endotracheal tube 5 cm from carina in good position. NG tube terminates in the mid mediastinum and deflected just left of the vertebral column. Cannot exclude endotracheal location of the NG tube.  Lungs are well inflated.  No pneumothorax.  No pulmonary edema.  IMPRESSION: 1. Endotracheal tube in good position. 2. Indeterminate location of the nasogastric tube which terminates in the mid mediastinum. Cannot exclude endotracheal course of the NG tube. Findings conveyed toREBECCA LORD on 09/04/2014  at12:38.   Electronically Signed   By: Genevive Bi M.D.   On: 09/04/2014 12:38   Dg  Chest Port 1 View  09/04/2014   CLINICAL DATA:  Respiratory distress.  EXAM: PORTABLE CHEST - 1 VIEW  COMPARISON:  08/04/2012  FINDINGS: Borderline cardiomegaly. Pulmonary vascularity is within normal limits. Lungs are clear. No pneumothorax. No pleural effusion.  IMPRESSION: Borderline cardiomegaly without decompensation   Electronically Signed   By: Jolaine Click M.D.   On: 09/04/2014 09:43    Microbiology: Recent Results (from the past 240 hour(s))  Culture, blood (routine x 2)     Status: None   Collection Time: 09/04/14  8:53 AM  Result Value Ref Range Status   Specimen Description BLOOD RIGHT HAND  Final   Special Requests Normal  Final   Culture NO GROWTH 5 DAYS  Final   Report  Status 09/09/2014 FINAL  Final  Culture, blood (routine x 2)     Status: None   Collection Time: 09/04/14  9:17 AM  Result Value Ref Range Status   Specimen Description BLOOD RIGHT ARM  Final   Special Requests Normal  Final   Culture  Setup Time   Final    GRAM POSITIVE COCCI IN CLUSTERS AEROBIC BOTTLE ONLY CRITICAL RESULT CALLED TO, READ BACK BY AND VERIFIED WITH: KRISTY ALSTON AT 1544 6 20 16  CTJ    Culture   Final    COAGULASE NEGATIVE STAPHYLOCOCCUS AEROBIC BOTTLE ONLY POSSIBLE CONTAMINATION WITH SKIN FLORA    Report Status 09/09/2014 FINAL  Final  Urine culture     Status: None   Collection Time: 09/04/14  1:41 PM  Result Value Ref Range Status   Specimen Description URINE, CLEAN CATCH  Final   Special Requests Normal  Final   Culture NO GROWTH 2 DAYS  Final   Report Status 09/06/2014 FINAL  Final  MRSA PCR Screening     Status: None   Collection Time: 09/04/14  5:50 PM  Result Value Ref Range Status   MRSA by PCR NEGATIVE NEGATIVE Final    Comment:        The GeneXpert MRSA Assay (FDA approved for NASAL specimens only), is one component of a comprehensive MRSA colonization surveillance program. It is not intended to diagnose MRSA infection nor to guide or monitor treatment for MRSA  infections.   Culture, expectorated sputum-assessment     Status: None   Collection Time: 09/05/14  4:21 AM  Result Value Ref Range Status   Specimen Description ENDOTRACHEAL  Final   Special Requests NONE  Final   Sputum evaluation THIS SPECIMEN IS ACCEPTABLE FOR SPUTUM CULTURE  Final   Report Status 09/05/2014 FINAL  Final  Culture, respiratory (NON-Expectorated)     Status: None   Collection Time: 09/05/14  4:21 AM  Result Value Ref Range Status   Specimen Description ENDOTRACHEAL  Final   Special Requests NONE Reflexed from [id number]  Final   Gram Stain   Final    FAIR SPECIMEN - 70-80% WBCS RARE WBC SEEN FEW GRAM POSITIVE COCCI IN PAIRS AND CHAINS    Culture LIGHT GROWTH SERRATIA MARCESCENS  Final   Report Status 09/07/2014 FINAL  Final   Organism ID, Bacteria SERRATIA MARCESCENS  Final      Susceptibility   Serratia marcescens - MIC*    CEFAZOLIN >=64 RESISTANT Resistant     CEFTRIAXONE <=1 SENSITIVE Sensitive     CIPROFLOXACIN <=0.25 SENSITIVE Sensitive     GENTAMICIN <=1 SENSITIVE Sensitive     TRIMETH/SULFA <=20 SENSITIVE Sensitive     CEFOXITIN 16 RESISTANT Resistant     * LIGHT GROWTH SERRATIA MARCESCENS     Labs: Basic Metabolic Panel:  Recent Labs Lab 09/06/14 0523 09/07/14 0734 09/08/14 0543 09/09/14 0354 09/10/14 0454  NA 136 142 142 138 139  K 3.9 4.0 4.5 4.6 4.3  CL 104 104 101 97* 101  CO2 27 31 31  33* 32  GLUCOSE 124* 172* 112* 102* 94  BUN 43* 32* 24* 23* 32*  CREATININE 0.90 0.67 0.60 0.58 0.71  CALCIUM 8.0* 8.8* 9.2 8.9 8.8*  MG 2.6*  --  2.3  --   --   PHOS 2.8  --   --   --   --    Liver Function Tests: No results for input(s): AST, ALT, ALKPHOS, BILITOT, PROT, ALBUMIN in the last 168 hours.  No results for input(s): LIPASE, AMYLASE in the last 168 hours. No results for input(s): AMMONIA in the last 168 hours. CBC:  Recent Labs Lab 09/06/14 0523 09/07/14 0734 09/09/14 0354 09/10/14 0454  WBC 9.9 10.2 9.7 9.4  NEUTROABS 8.3* 8.6*  7.7* 7.0*  HGB 11.8* 12.2 14.3 14.4  HCT 36.6 38.1 43.2 44.0  MCV 90.5 90.6 90.4 90.7  PLT 170 181 208 222   Cardiac Enzymes: No results for input(s): CKTOTAL, CKMB, CKMBINDEX, TROPONINI in the last 168 hours. BNP: BNP (last 3 results) No results for input(s): BNP in the last 8760 hours.  ProBNP (last 3 results) No results for input(s): PROBNP in the last 8760 hours.  CBG:  Recent Labs Lab 09/10/14 0001 09/10/14 0415 09/10/14 0719 09/10/14 1112 09/10/14 1614  GLUCAP 117* 88 86 101* 191*       Signed:  Camira Geidel   09/12/2014, 12:56 PM

## 2014-09-12 NOTE — Discharge Instructions (Signed)
Acute Respiratory Failure °Respiratory failure is when your lungs are not working well and your breathing (respiratory) system fails. When respiratory failure occurs, it is difficult for your lungs to get enough oxygen, get rid of carbon dioxide, or both. Respiratory failure can be life threatening.  °Respiratory failure can be acute or chronic. Acute respiratory failure is sudden, severe, and requires emergency medical treatment. Chronic respiratory failure is less severe, happens over time, and requires ongoing treatment.  °WHAT ARE THE CAUSES OF ACUTE RESPIRATORY FAILURE?  °Any problem affecting the heart or lungs can cause acute respiratory failure. Some of these causes include the following: °· Chronic bronchitis and emphysema (COPD).   °· Blood clot going to a lung (pulmonary embolism).   °· Having water in the lungs caused by heart failure, lung injury, or infection (pulmonary edema).   °· Collapsed lung (pneumothorax).   °· Pneumonia.   °· Pulmonary fibrosis.   °· Obesity.   °· Asthma.   °· Heart failure.   °· Any type of trauma to the chest that can make breathing difficult.   °· Nerve or muscle diseases making chest movements difficult. °WHAT SYMPTOMS SHOULD YOU WATCH FOR?  °If you have any of these signs or symptoms, you should seek immediate medical care:  °· You have shortness of breath (dyspnea) with or without activity.   °· You have rapid, fast breathing (tachypnea).   °· You are wheezing. °· You are unable to say more than a few words without having to catch your breath. °· You find it very difficult to function normally. °· You have a fast heart rate.   °· You have a bluish color to your finger or toe nail beds.   °· You have confusion or drowsiness or both.   °HOW WILL MY ACUTE RESPIRATORY FAILURE BE TREATED?  °Treatment of acute respiratory failure depends on the cause of the respiratory failure. Usually, you will stay in the intensive care unit so your breathing can be watched closely. Treatment  can include the following: °· Oxygen. Oxygen can be delivered through the following: °¨ Nasal cannula. This is small tubing that goes in your nose to give you oxygen. °¨ Face mask. A face mask covers your nose and mouth to give you oxygen. °· Medicine. Different medicines can be given to help with breathing. These can include: °¨ Nebulizers. Nebulizers deliver medicines to open the air passages (bronchodilators). These medicines help to open or relax the airways in the lungs so you can breathe better. They can also help loosen mucus from your lungs. °¨ Diuretics. Diuretic medicines can help you breathe better by getting rid of extra water in your body. °¨ Steroids. Steroid medicines can help decrease swelling (inflammation) in your lungs. °¨ Antibiotics. °· Chest tube. If you have a collapsed lung (pneumothorax), a chest tube is placed to help reinflate the lung. °· Non-invasive positive pressure ventilation (NPPV). This is a tight-fitting mask that goes over your nose and mouth. The mask has tubing that is attached to a machine. The machine blows air into the tubing, which helps to keep the tiny air sacs (alveoli) in your lungs open. This machine allows you to breathe on your own. °· Ventilator. A ventilator is a breathing machine. When on a ventilator, a breathing tube is put into the lungs. A ventilator is used when you can no longer breathe well enough on your own. You may have low oxygen levels or high carbon dioxide (CO2) levels in your blood. When you are on a ventilator, sedation and pain medicines are given to make you sleep   so your lungs can heal. °Document Released: 03/09/2013 Document Revised: 07/19/2013 Document Reviewed: 03/09/2013 °Exit[employer]® Patient Information ©2015 [employer]. This information is not intended to replace advice given to you by your health care provider. Make sure you discuss any questions you have with your health care provider. ° °

## 2014-09-13 ENCOUNTER — Other Ambulatory Visit: Payer: Self-pay | Admitting: Internal Medicine

## 2016-05-15 ENCOUNTER — Ambulatory Visit: Admit: 2016-05-15 | Payer: Medicare Other | Admitting: Unknown Physician Specialty

## 2016-05-15 SURGERY — EGD (ESOPHAGOGASTRODUODENOSCOPY)
Anesthesia: General

## 2017-10-28 ENCOUNTER — Emergency Department: Payer: Medicare Other

## 2017-10-28 ENCOUNTER — Encounter: Payer: Self-pay | Admitting: Emergency Medicine

## 2017-10-28 ENCOUNTER — Other Ambulatory Visit: Payer: Self-pay

## 2017-10-28 ENCOUNTER — Inpatient Hospital Stay
Admission: EM | Admit: 2017-10-28 | Discharge: 2017-11-01 | DRG: 378 | Disposition: A | Discharge status: Home / Self Care | Payer: Medicare Other | Source: Ambulatory Visit | Attending: Internal Medicine | Admitting: Internal Medicine

## 2017-10-28 DIAGNOSIS — Z7982 Long term (current) use of aspirin: Secondary | ICD-10-CM | POA: Diagnosis not present

## 2017-10-28 DIAGNOSIS — Z9981 Dependence on supplemental oxygen: Secondary | ICD-10-CM | POA: Diagnosis not present

## 2017-10-28 DIAGNOSIS — Z808 Family history of malignant neoplasm of other organs or systems: Secondary | ICD-10-CM

## 2017-10-28 DIAGNOSIS — D649 Anemia, unspecified: Secondary | ICD-10-CM | POA: Diagnosis not present

## 2017-10-28 DIAGNOSIS — K640 First degree hemorrhoids: Secondary | ICD-10-CM | POA: Diagnosis present

## 2017-10-28 DIAGNOSIS — Z8249 Family history of ischemic heart disease and other diseases of the circulatory system: Secondary | ICD-10-CM

## 2017-10-28 DIAGNOSIS — K922 Gastrointestinal hemorrhage, unspecified: Secondary | ICD-10-CM

## 2017-10-28 DIAGNOSIS — R0602 Shortness of breath: Secondary | ICD-10-CM | POA: Diagnosis not present

## 2017-10-28 DIAGNOSIS — K5730 Diverticulosis of large intestine without perforation or abscess without bleeding: Secondary | ICD-10-CM | POA: Diagnosis present

## 2017-10-28 DIAGNOSIS — Z7951 Long term (current) use of inhaled steroids: Secondary | ICD-10-CM

## 2017-10-28 DIAGNOSIS — K449 Diaphragmatic hernia without obstruction or gangrene: Secondary | ICD-10-CM | POA: Diagnosis present

## 2017-10-28 DIAGNOSIS — I10 Essential (primary) hypertension: Secondary | ICD-10-CM | POA: Diagnosis present

## 2017-10-28 DIAGNOSIS — J449 Chronic obstructive pulmonary disease, unspecified: Secondary | ICD-10-CM | POA: Diagnosis present

## 2017-10-28 DIAGNOSIS — K222 Esophageal obstruction: Secondary | ICD-10-CM | POA: Diagnosis present

## 2017-10-28 DIAGNOSIS — K264 Chronic or unspecified duodenal ulcer with hemorrhage: Secondary | ICD-10-CM | POA: Diagnosis present

## 2017-10-28 DIAGNOSIS — E876 Hypokalemia: Secondary | ICD-10-CM | POA: Diagnosis present

## 2017-10-28 DIAGNOSIS — N390 Urinary tract infection, site not specified: Secondary | ICD-10-CM | POA: Diagnosis present

## 2017-10-28 DIAGNOSIS — R42 Dizziness and giddiness: Secondary | ICD-10-CM | POA: Diagnosis present

## 2017-10-28 DIAGNOSIS — E538 Deficiency of other specified B group vitamins: Secondary | ICD-10-CM | POA: Diagnosis present

## 2017-10-28 DIAGNOSIS — R195 Other fecal abnormalities: Secondary | ICD-10-CM | POA: Diagnosis not present

## 2017-10-28 DIAGNOSIS — D500 Iron deficiency anemia secondary to blood loss (chronic): Secondary | ICD-10-CM | POA: Diagnosis present

## 2017-10-28 DIAGNOSIS — Z87891 Personal history of nicotine dependence: Secondary | ICD-10-CM

## 2017-10-28 DIAGNOSIS — Z7952 Long term (current) use of systemic steroids: Secondary | ICD-10-CM | POA: Diagnosis not present

## 2017-10-28 DIAGNOSIS — R5383 Other fatigue: Secondary | ICD-10-CM | POA: Diagnosis not present

## 2017-10-28 LAB — ABO/RH: ABO/RH(D): O POS

## 2017-10-28 LAB — BASIC METABOLIC PANEL WITH GFR
Anion gap: 6 (ref 5–15)
BUN: 16 mg/dL (ref 8–23)
CO2: 30 mmol/L (ref 22–32)
Calcium: 8.6 mg/dL — ABNORMAL LOW (ref 8.9–10.3)
Chloride: 105 mmol/L (ref 98–111)
Creatinine, Ser: 0.82 mg/dL (ref 0.44–1.00)
GFR calc Af Amer: >60 mL/min
GFR calc non Af Amer: >60 mL/min
Glucose, Bld: 153 mg/dL — ABNORMAL HIGH (ref 70–99)
Potassium: 3 mmol/L — ABNORMAL LOW (ref 3.5–5.1)
Sodium: 141 mmol/L (ref 135–145)

## 2017-10-28 LAB — CBC
HCT: 17.6 % — ABNORMAL LOW (ref 35.0–47.0)
Hemoglobin: 5.5 g/dL — ABNORMAL LOW (ref 12.0–16.0)
MCH: 26 pg (ref 26.0–34.0)
MCHC: 31.5 g/dL — ABNORMAL LOW (ref 32.0–36.0)
MCV: 82.5 fL (ref 80.0–100.0)
Platelets: 471 K/uL — ABNORMAL HIGH (ref 150–440)
RBC: 2.13 MIL/uL — ABNORMAL LOW (ref 3.80–5.20)
RDW: 16.4 % — ABNORMAL HIGH (ref 11.5–14.5)
WBC: 9.4 K/uL (ref 3.6–11.0)

## 2017-10-28 LAB — PREPARE RBC (CROSSMATCH)

## 2017-10-28 MED ORDER — PREDNISONE 5 MG PO TABS
5.0000 mg | ORAL_TABLET | Freq: Every day | ORAL | Status: DC
  Administered 2017-10-29 – 2017-11-01 (×3): 5 mg via ORAL
  Filled 2017-10-28 (×5): qty 1

## 2017-10-28 MED ORDER — TIOTROPIUM BROMIDE MONOHYDRATE 18 MCG IN CAPS
18.0000 ug | ORAL_CAPSULE | Freq: Every evening | RESPIRATORY_TRACT | Status: DC
  Administered 2017-10-29 – 2017-10-31 (×3): 18 ug via RESPIRATORY_TRACT
  Filled 2017-10-28: qty 5

## 2017-10-28 MED ORDER — ALBUTEROL SULFATE (2.5 MG/3ML) 0.083% IN NEBU
3.0000 mL | INHALATION_SOLUTION | Freq: Four times a day (QID) | RESPIRATORY_TRACT | Status: DC | PRN
  Administered 2017-10-31: 3 mL via RESPIRATORY_TRACT
  Filled 2017-10-28 (×2): qty 3

## 2017-10-28 MED ORDER — SODIUM CHLORIDE 0.9 % IV SOLN
80.0000 mg | Freq: Once | INTRAVENOUS | Status: AC
  Administered 2017-10-28: 80 mg via INTRAVENOUS
  Filled 2017-10-28: qty 80

## 2017-10-28 MED ORDER — HEPARIN SODIUM (PORCINE) 5000 UNIT/ML IJ SOLN: 5000.0000 [IU] | Freq: Three times a day (TID) | INTRAMUSCULAR | Status: DC

## 2017-10-28 MED ORDER — POTASSIUM CHLORIDE CRYS ER 20 MEQ PO TBCR
20.0000 meq | EXTENDED_RELEASE_TABLET | Freq: Two times a day (BID) | ORAL | Status: DC
  Administered 2017-10-28 – 2017-11-01 (×7): 20 meq via ORAL
  Filled 2017-10-28 (×8): qty 1

## 2017-10-28 MED ORDER — SODIUM CHLORIDE 0.9 % IV SOLN
10.0000 mL/h | Freq: Once | INTRAVENOUS | Status: AC
  Administered 2017-10-28: 10 mL/h via INTRAVENOUS

## 2017-10-28 MED ORDER — ONDANSETRON HCL 4 MG/2ML IJ SOLN: 4.0000 mg | Freq: Four times a day (QID) | INTRAMUSCULAR | Status: DC | PRN

## 2017-10-28 MED ORDER — SODIUM CHLORIDE 0.9 % IV SOLN
INTRAVENOUS | Status: DC
  Administered 2017-10-29 – 2017-10-31 (×4): via INTRAVENOUS

## 2017-10-28 MED ORDER — DOCUSATE SODIUM 100 MG PO CAPS: 100.0000 mg | ORAL_CAPSULE | Freq: Two times a day (BID) | ORAL | Status: DC | PRN

## 2017-10-28 MED ORDER — SODIUM CHLORIDE 0.9 % IV SOLN
8.0000 mg/h | INTRAVENOUS | Status: DC
  Administered 2017-10-28 – 2017-10-31 (×5): 8 mg/h via INTRAVENOUS
  Filled 2017-10-28 (×6): qty 80

## 2017-10-28 MED ORDER — MOMETASONE FURO-FORMOTEROL FUM 200-5 MCG/ACT IN AERO
2.0000 | INHALATION_SPRAY | Freq: Two times a day (BID) | RESPIRATORY_TRACT | Status: DC
  Administered 2017-10-29 – 2017-11-01 (×7): 2 via RESPIRATORY_TRACT
  Filled 2017-10-28: qty 8.8

## 2017-10-28 NOTE — ED Notes (Signed)
Sonja RN, aware of bed assigned  

## 2017-10-28 NOTE — Progress Notes (Signed)
Family Meeting Note  Advance Directive:yes  Today a meeting took place with the Patient and daughter, grand daughter.  The following clinical team members were present during this meeting:MD  The following were discussed:Patient's diagnosis: anemia, GI bleed, Patient's progosis: Unable to determine and Goals for treatment: Full Code  Additional follow-up to be provided: Gi  Time spent during discussion:20 minutes  Altamese DillingVaibhavkumar Antwaine Boomhower, MD

## 2017-10-28 NOTE — H&P (Signed)
[facility] [place] [facility]   PATIENT NAME: [name] [name]    MR#:  [id number]  DATE OF BIRTH:  12/06/1945  DATE OF ADMISSION:  10/28/2017  PRIMARY CARE PHYSICIAN: Barbette ReichmannHande, Vishwanath, MD   REQUESTING/REFERRING PHYSICIAN: Derrill KayGoodman  CHIEF COMPLAINT:   Chief Complaint  Patient presents with  . Dizziness    orthostatic     HISTORY OF PRESENT ILLNESS: [name] [name]  is a 72 y.o. female with a known history of COPD and hypertension, I have chronic anemia but not diagnosed the reason until yet. For last few days feels significantly worse and have dizziness. Called her clinic and went to have an urgent visit there in the morning where noted to have low blood pressures so they advised to go to emergency room right away. Came to emergency room with that and noted to have low blood pressure and some tachycardia with low hemoglobin. After some fluids blood pressure is stable but hemoglobin noted to be 5.5 and guaiac was positive so she was ordered to have blood transfusion and given to hospitalist team for further management. Patient denies any history of peptic ulcer disease and she never had colonoscopy done.  She denies history of colon cancer in the family.  She denies using over-the-counter pain medications or BC or Goody powders.  She takes small dose aspirin every day.  PAST MEDICAL HISTORY:   Past Medical History:  Diagnosis Date  . COPD (chronic obstructive pulmonary disease) (HCC)   . Hypertension     PAST SURGICAL HISTORY:  Past Surgical History:  Procedure Laterality Date  . ABDOMINAL HYSTERECTOMY      SOCIAL HISTORY:  Social History   Tobacco Use  . Smoking status: Former Smoker  Substance Use Topics  . Alcohol use: No    FAMILY HISTORY:  Family History  Problem Relation Age of Onset  . Hypertension Mother   . Melanoma Father     DRUG ALLERGIES: No Known Allergies  REVIEW OF SYSTEMS:   CONSTITUTIONAL: No fever, have fatigue or weakness.   EYES: No blurred or double vision.  EARS, NOSE, AND THROAT: No tinnitus or ear pain.  RESPIRATORY: No cough, shortness of breath, wheezing or hemoptysis.  CARDIOVASCULAR: No chest pain, orthopnea, edema.  GASTROINTESTINAL: No nausea, vomiting, diarrhea or abdominal pain.  GENITOURINARY: No dysuria, hematuria.  ENDOCRINE: No polyuria, nocturia,  HEMATOLOGY: No anemia, easy bruising or bleeding SKIN: No rash or lesion. MUSCULOSKELETAL: No joint pain or arthritis.   NEUROLOGIC: No tingling, numbness, weakness.  PSYCHIATRY: No anxiety or depression.   MEDICATIONS AT HOME:  Prior to Admission medications   Medication Sig Start Date End Date Taking? Authorizing Provider  albuterol (PROVENTIL HFA;VENTOLIN HFA) 108 (90 Base) MCG/ACT inhaler Inhale 2 puffs into the lungs every 6 (six) hours as needed for wheezing or shortness of breath.   Yes [provider]  albuterol (PROVENTIL) (2.5 MG/3ML) 0.083% nebulizer solution Inhale 3 mLs into the lungs every 6 (six) hours as needed for wheezing.    Yes [provider]  aspirin EC 81 MG EC tablet Take 1 tablet (81 mg total) by mouth daily. 09/12/14  Yes Hande, Roderic PalauVishwanath, MD  cholecalciferol (VITAMIN D) 1000 units tablet Take 1,000 Units by mouth daily with lunch.   Yes [provider]  docusate sodium (COLACE) 100 MG capsule Take 100 mg by mouth 2 (two) times daily as needed for mild constipation or moderate constipation.    Yes [provider]  ferrous sulfate 325 (65  FE) MG tablet Take 325 mg by mouth daily.   Yes [provider]  Fluticasone-Salmeterol (ADVAIR) 500-50 MCG/DOSE AEPB Inhale 1 puff into the lungs every 12 (twelve) hours.   Yes [provider]  furosemide (LASIX) 20 MG tablet Take 20 mg by mouth daily with lunch.   Yes [provider]  predniSONE (DELTASONE) 5 MG tablet Take 5 mg by mouth daily. 08/19/14  Yes [provider]  tiotropium (SPIRIVA) 18 MCG inhalation  capsule Place 1 capsule (18 mcg total) into inhaler and inhale daily. Patient taking differently: Place 18 mcg into inhaler and inhale daily with lunch.  09/12/14  Yes Hande, Vishwanath, MD  vitamin C (ASCORBIC ACID) 500 MG tablet Take 500 mg by mouth daily.   Yes [provider]      PHYSICAL EXAMINATION:   VITAL SIGNS: Blood pressure 114/63, pulse (!) 117, temperature 97.6 F (36.4 C), temperature source Oral, resp. rate 18, height 5\' 7"  (1.702 m), weight 85.3 kg, SpO2 100 %.  GENERAL:  72 y.o.-year-old patient lying in the bed with no acute distress.  EYES: Pupils equal, round, reactive to light and accommodation. No scleral icterus. Extraocular muscles intact.  HEENT: Head atraumatic, normocephalic. Oropharynx and nasopharynx clear.  NECK:  Supple, no jugular venous distention. No thyroid enlargement, no tenderness.  LUNGS: Normal breath sounds bilaterally, no wheezing, rales,rhonchi or crepitation. No use of accessory muscles of respiration.  CARDIOVASCULAR: S1, S2 normal. No murmurs, rubs, or gallops.  ABDOMEN: Soft, nontender, nondistended. Bowel sounds present. No organomegaly or mass.  EXTREMITIES: No pedal edema, cyanosis, or clubbing.  NEUROLOGIC: Cranial nerves II through XII are intact. Muscle strength 5/5 in all extremities. Sensation intact. Gait not checked.  PSYCHIATRIC: The patient is alert and oriented x 3.  SKIN: No obvious rash, lesion, or ulcer.   LABORATORY PANEL:   CBC Recent Labs  Lab 10/28/17 1632  WBC 9.4  HGB 5.5*  HCT 17.6*  PLT 471*  MCV 82.5  MCH 26.0  MCHC 31.5*  RDW 16.4*   ------------------------------------------------------------------------------------------------------------------  Chemistries  Recent Labs  Lab 10/28/17 1632  NA 141  K 3.0*  CL 105  CO2 30  GLUCOSE 153*  BUN 16  CREATININE 0.82  CALCIUM 8.6*    ------------------------------------------------------------------------------------------------------------------ estimated creatinine clearance is 69.6 mL/min (by C-G formula based on SCr of 0.82 mg/dL). ------------------------------------------------------------------------------------------------------------------ No results for input(s): TSH, T4TOTAL, T3FREE, THYROIDAB in the last 72 hours.  Invalid input(s): FREET3   Coagulation profile No results for input(s): INR, PROTIME in the last 168 hours. ------------------------------------------------------------------------------------------------------------------- No results for input(s): DDIMER in the last 72 hours. -------------------------------------------------------------------------------------------------------------------  Cardiac Enzymes No results for input(s): CKMB, TROPONINI, MYOGLOBIN in the last 168 hours.  Invalid input(s): CK ------------------------------------------------------------------------------------------------------------------ Invalid input(s): POCBNP  ---------------------------------------------------------------------------------------------------------------  Urinalysis    Component Value Date/Time   COLORURINE YELLOW (A) 09/04/2014 1341   APPEARANCEUR CLOUDY (A) 09/04/2014 1341   LABSPEC 1.020 09/04/2014 1341   PHURINE 5.0 09/04/2014 1341   GLUCOSEU NEGATIVE 09/04/2014 1341   HGBUR 1+ (A) 09/04/2014 1341   BILIRUBINUR NEGATIVE 09/04/2014 1341   KETONESUR NEGATIVE 09/04/2014 1341   PROTEINUR 100 (A) 09/04/2014 1341   NITRITE NEGATIVE 09/04/2014 1341   LEUKOCYTESUR NEGATIVE 09/04/2014 1341     RADIOLOGY: Dg Chest 2 View  Result Date: 10/28/2017 CLINICAL DATA:  Short of breath EXAM: CHEST - 2 VIEW COMPARISON:  09/05/2014 FINDINGS: Linear scarring or atelectasis at the right base. No acute consolidation or pleural effusion. Borderline cardiomegaly. Aortic atherosclerosis. No  pneumothorax.  IMPRESSION: No active cardiopulmonary disease. Borderline cardiomegaly. Linear atelectasis or scarring at the right base. Electronically Signed   By: Jasmine Pang M.D.   On: 10/28/2017 17:05    EKG: Orders placed or performed during the hospital encounter of 10/28/17  . EKG 12-Lead  . EKG 12-Lead  . ED EKG  . ED EKG    IMPRESSION AND PLAN:  *Symptomatic anemia Due to blood loss over her chronic iron deficiency anemia.  1 unit of blood transfusion is ordered by ER physician. We will give IV fluids for now. Stool for guaiac is positive.  GI consult for further consideration. Will give liquid diet for now and keep n.p.o. from midnight. Protonix IV drip.  Avoid anticoagulants.  *COPD Continue inhalers and nebulizer therapy.  *Hypertension We will hold medications for now.   *Hypokalemia start replacement oral.  All the records are reviewed and case discussed with ED provider. Management plans discussed with the patient, family and they are in agreement.  CODE STATUS: Full. Code Status History    Date Active Date Inactive Code Status Order ID Comments User Context   09/04/2014 1427 09/12/2014 1833 Full Code [id number]  Gale Journey, MD Inpatient    Advance Directive Documentation     Most Recent Value  Type of Advance Directive  Healthcare Power of Attorney  Pre-existing out of facility DNR order (yellow form or pink MOST form)  -  "MOST" Form in Place?  -     I talked to patient's daughter and granddaughter in the room.  TOTAL TIME TAKING CARE OF THIS PATIENT: 45 minutes.    Altamese Dilling M.D on 10/28/2017   Between 7am to 6pm - Pager - [phone]  After 6pm go to [email or web address] - password [email or web address]  [facility]  Office  [phone]  CC: Primary care physician; Barbette Reichmann, MD   Note: This dictation was prepared with Dragon dictation along with smaller phrase technology. Any transcriptional errors that result  from this process are unintentional.

## 2017-10-28 NOTE — ED Notes (Signed)
Family at bedside. Patient has no change in status with blood transfusion.

## 2017-10-28 NOTE — ED Triage Notes (Signed)
Pt was sent by [facility] for dizzy spells with change of position. Pt was orthostatic with a drop from 180/90 to 60/40. Pt denies any pain in triage. Pt's blood pressure 102/53 sitting with no complaints of this time. Pt denies any increased shortness of breath except with change of position.

## 2017-10-28 NOTE — ED Provider Notes (Signed)
[facility] Emergency Department Provider Note  ____________________________________________   I have reviewed the triage vital signs and the nursing notes.   HISTORY  Chief Complaint Dizziness (orthostatic )   History limited by: Not Limited   HPI [name] [name] is a 72 y.o. female who presents to the emergency department today because of concerns for dizziness.  Patient states it is worse when she stands up or sits up.  Has been present the past couple days but is continued to get worse.  Patient denies any associated chest pain or headache.  She does have some baseline shortness of breath secondary COPD but states it has not gotten any worse.  Patient does state that she has had dark stools however is on iron pills so does not feel like there is been any significant change there.    Per medical record review patient has a history of COPD, hypertension  Past Medical History:  Diagnosis Date  . COPD (chronic obstructive pulmonary disease) (HCC)   . Hypertension     Patient Active Problem List   Diagnosis Date Noted  . COPD exacerbation (HCC) 09/04/2014  . Acute respiratory failure with hypercapnia (HCC) 09/04/2014    Past Surgical History:  Procedure Laterality Date  . ABDOMINAL HYSTERECTOMY      Prior to Admission medications   Medication Sig Start Date End Date Taking? Authorizing Provider  ADVAIR DISKUS 500-50 MCG/DOSE AEPB Inhale 1 puff into the lungs every 12 (twelve) hours. 08/09/14   [provider]  albuterol (PROVENTIL) (2.5 MG/3ML) 0.083% nebulizer solution Inhale 3 mLs into the lungs every 6 (six) hours as needed. For wheezing 02/15/14   [provider]  aspirin EC 81 MG EC tablet Take 1 tablet (81 mg total) by mouth daily. 09/12/14   Barbette Reichmann, MD  levofloxacin (LEVAQUIN) 500 MG tablet Take 1 tablet (500 mg total) by mouth daily. 09/12/14   Barbette Reichmann, MD  metoprolol tartrate (LOPRESSOR) 25 MG tablet Take 25  mg by mouth 2 (two) times daily. 08/05/14   [provider]  predniSONE (DELTASONE) 10 MG tablet Prednisone 10 mg tabs  Take 4 tabs po once daily for 3 days Take 3 tabs po once daily for 3 days Take 2 tabs po once daily for 3 days Take 1 tab po once daily for 3 days And then continue 5 mg tabs as  before  Total: 30 tablets 09/12/14   Hande, Vishwanath, MD  predniSONE (DELTASONE) 5 MG tablet Take 5 mg by mouth daily. 08/19/14   [provider]  tiotropium (SPIRIVA) 18 MCG inhalation capsule Place 1 capsule (18 mcg total) into inhaler and inhale daily. 09/12/14   Hande, Roderic Palau, MD  VENTOLIN HFA 108 (90 BASE) MCG/ACT inhaler Inhale 2 puffs into the lungs every 6 (six) hours as needed. For wheezing 08/30/14   [provider]  Vitamin D, Ergocalciferol, (DRISDOL) 50000 UNITS CAPS capsule Take 50,000 Units by mouth once a week. 09/01/14   [provider]    Allergies Patient has no known allergies.  Family History  Problem Relation Age of Onset  . Hypertension Mother   . Melanoma Father     Social History Social History   Tobacco Use  . Smoking status: Former Smoker  Substance Use Topics  . Alcohol use: No  . Drug use: Not on file    Review of Systems Constitutional: No fever/chills Eyes: No visual changes. ENT: No sore throat. Cardiovascular: Denies chest pain. Respiratory: Positive for some  baseline shortness of breath Gastrointestinal: No abdominal pain.  No nausea, no vomiting.  No diarrhea.   Genitourinary: Negative for dysuria. Musculoskeletal: Negative for back pain. Skin: Negative for rash. Neurological: Positive for dizziness ____________________________________________   PHYSICAL EXAM:  VITAL SIGNS: ED Triage Vitals  Enc Vitals Group     BP 10/28/17 1627 (!) 105/53     Pulse Rate 10/28/17 1627 (!) 116     Resp 10/28/17 1627 20     Temp 10/28/17 1627 97.6 F (36.4 C)     Temp Source 10/28/17 1627 Oral     SpO2 10/28/17  1627 100 %     Weight 10/28/17 1628 188 lb (85.3 kg)     Height 10/28/17 1628 5\' 7"  (1.702 m)     Head Circumference --      Peak Flow --      Pain Score 10/28/17 1628 0    Constitutional: Alert and oriented.  Eyes: Conjunctivae are normal.  ENT      Head: Normocephalic and atraumatic.      Nose: No congestion/rhinnorhea.      Mouth/Throat: Mucous membranes are moist.      Neck: No stridor. Hematological/Lymphatic/Immunilogical: No cervical lymphadenopathy. Cardiovascular: Tachycardic, regular rhythm.  No murmurs, rubs, or gallops. Respiratory: Normal respiratory effort without tachypnea nor retractions. Breath sounds are clear and equal bilaterally. No wheezes/rales/rhonchi. Gastrointestinal: Soft and non tender. No rebound. No guarding.  Rectal: Dark stool on glove.  Guaiac positive. Musculoskeletal: Normal range of motion in all extremities. No lower extremity edema. Neurologic:  Normal speech and language. No gross focal neurologic deficits are appreciated.  Skin:  Skin is warm, dry and intact. No rash noted. Psychiatric: Mood and affect are normal. Speech and behavior are normal. Patient exhibits appropriate insight and judgment.  ____________________________________________    LABS (pertinent positives/negatives)  CBC wbc 9.4, hgb 5.5, plt 471 BMP na 141, k 3.0, glu 153 ____________________________________________   EKG  I, Phineas SemenGraydon Herrick Hartog, attending physician, personally viewed and interpreted this EKG  EKG Time: 1624 Rate: 115 Rhythm: sinus tachycardia Axis: normal Intervals: qtc 450 QRS: narrow ST changes: no st elevation Impression: abnormal ekg   ____________________________________________    RADIOLOGY  CXR No acute disease  ____________________________________________   PROCEDURES  Procedures  CRITICAL CARE Performed by: Phineas SemenGraydon Noya Santarelli   Total critical care time: 30 minutes  Critical care time was exclusive of separately billable  procedures and treating other patients.  Critical care was necessary to treat or prevent imminent or life-threatening deterioration.  Critical care was time spent personally by me on the following activities: development of treatment plan with patient and/or surrogate as well as nursing, discussions with consultants, evaluation of patient's response to treatment, examination of patient, obtaining history from patient or surrogate, ordering and performing treatments and interventions, ordering and review of laboratory studies, ordering and review of radiographic studies, pulse oximetry and re-evaluation of patient's condition.  ____________________________________________   INITIAL IMPRESSION / ASSESSMENT AND PLAN / ED COURSE  Pertinent labs & imaging results that were available during my care of the patient were reviewed by me and considered in my medical decision making (see chart for details).   Presented to the emergency department today because of concerns for dizziness which is gotten worse over the past couple of days.  Blood work did show hemoglobin 5.5.  Patient was guaiac positive.  At this point of concern for anemia secondary to GI bleed.  Did discuss and consent patient for blood transfusion.  Additionally  will start Protonix.  Will plan on admission.  Discussed plan with patient family.  ____________________________________________   FINAL CLINICAL IMPRESSION(S) / ED DIAGNOSES  Final diagnoses:  Dizziness  Anemia, unspecified type  Gastrointestinal hemorrhage, unspecified gastrointestinal hemorrhage type     Note: This dictation was prepared with Dragon dictation. Any transcriptional errors that result from this process are unintentional     Phineas SemenGoodman, Zaira Iacovelli, MD 10/28/17 1819

## 2017-10-29 DIAGNOSIS — R195 Other fecal abnormalities: Secondary | ICD-10-CM

## 2017-10-29 DIAGNOSIS — J449 Chronic obstructive pulmonary disease, unspecified: Secondary | ICD-10-CM

## 2017-10-29 DIAGNOSIS — R0602 Shortness of breath: Secondary | ICD-10-CM

## 2017-10-29 DIAGNOSIS — R5383 Other fatigue: Secondary | ICD-10-CM

## 2017-10-29 DIAGNOSIS — D649 Anemia, unspecified: Secondary | ICD-10-CM

## 2017-10-29 DIAGNOSIS — Z87891 Personal history of nicotine dependence: Secondary | ICD-10-CM

## 2017-10-29 DIAGNOSIS — I10 Essential (primary) hypertension: Secondary | ICD-10-CM

## 2017-10-29 LAB — URINALYSIS, COMPLETE (UACMP) WITH MICROSCOPIC
BACTERIA UA: NONE SEEN
Bilirubin Urine: NEGATIVE
Glucose, UA: NEGATIVE mg/dL
HGB URINE DIPSTICK: NEGATIVE
Ketones, ur: NEGATIVE mg/dL
NITRITE: NEGATIVE
PH: 5 (ref 5.0–8.0)
Protein, ur: NEGATIVE mg/dL
Specific Gravity, Urine: 1.019 (ref 1.005–1.030)

## 2017-10-29 LAB — FOLATE: FOLATE: 11.8 ng/mL (ref >5.9)

## 2017-10-29 LAB — CBC
HEMATOCRIT: 19.3 % — AB (ref 35.0–47.0)
Hemoglobin: 6.4 g/dL — ABNORMAL LOW (ref 12.0–16.0)
MCH: 27.5 pg (ref 26.0–34.0)
MCHC: 33.4 g/dL (ref 32.0–36.0)
MCV: 82.5 fL (ref 80.0–100.0)
Platelets: 404 10*3/uL (ref 150–440)
RBC: 2.34 MIL/uL — AB (ref 3.80–5.20)
RDW: 15.7 % — AB (ref 11.5–14.5)
WBC: 10.6 10*3/uL (ref 3.6–11.0)

## 2017-10-29 LAB — BASIC METABOLIC PANEL
Anion gap: 6 (ref 5–15)
BUN: 13 mg/dL (ref 8–23)
CO2: 29 mmol/L (ref 22–32)
CREATININE: 0.83 mg/dL (ref 0.44–1.00)
Calcium: 8.1 mg/dL — ABNORMAL LOW (ref 8.9–10.3)
Chloride: 109 mmol/L (ref 98–111)
GFR calc Af Amer: >60 mL/min (ref >60)
GFR calc non Af Amer: >60 mL/min (ref >60)
Glucose, Bld: 116 mg/dL — ABNORMAL HIGH (ref 70–99)
POTASSIUM: 3.3 mmol/L — AB (ref 3.5–5.1)
SODIUM: 144 mmol/L (ref 135–145)

## 2017-10-29 LAB — HEMOGLOBIN AND HEMATOCRIT, BLOOD
HCT: 19.2 % — ABNORMAL LOW (ref 35.0–47.0)
HCT: 20.3 % — ABNORMAL LOW (ref 35.0–47.0)
Hemoglobin: 6.1 g/dL — ABNORMAL LOW (ref 12.0–16.0)
Hemoglobin: 6.4 g/dL — ABNORMAL LOW (ref 12.0–16.0)

## 2017-10-29 LAB — IRON AND TIBC
Iron: 16 ug/dL — ABNORMAL LOW (ref 28–170)
SATURATION RATIOS: 5 % — AB (ref 10.4–31.8)
TIBC: 337 ug/dL (ref 250–450)
UIBC: 321 ug/dL

## 2017-10-29 LAB — FERRITIN: FERRITIN: 15 ng/mL (ref 11–307)

## 2017-10-29 LAB — VITAMIN B12: VITAMIN B 12: 157 pg/mL — AB (ref 180–914)

## 2017-10-29 LAB — PREPARE RBC (CROSSMATCH)

## 2017-10-29 LAB — LACTATE DEHYDROGENASE: LDH: 141 U/L (ref 98–192)

## 2017-10-29 MED ORDER — SODIUM CHLORIDE 0.9 % IV SOLN
300.0000 mg | INTRAVENOUS | Status: AC
  Administered 2017-10-29 – 2017-10-31 (×3): 300 mg via INTRAVENOUS
  Filled 2017-10-29 (×3): qty 15

## 2017-10-29 MED ORDER — PEG 3350-KCL-NA BICARB-NACL 420 G PO SOLR
4000.0000 mL | Freq: Once | ORAL | Status: AC
  Administered 2017-10-29: 4000 mL via ORAL
  Filled 2017-10-29: qty 4000

## 2017-10-29 MED ORDER — POTASSIUM CHLORIDE CRYS ER 20 MEQ PO TBCR
40.0000 meq | EXTENDED_RELEASE_TABLET | Freq: Once | ORAL | Status: AC
  Administered 2017-10-29: 40 meq via ORAL
  Filled 2017-10-29: qty 2

## 2017-10-29 MED ORDER — SODIUM CHLORIDE 0.9% IV SOLUTION
Freq: Once | INTRAVENOUS | Status: AC
  Administered 2017-10-29: 17:00:00 via INTRAVENOUS

## 2017-10-29 NOTE — Consult Note (Signed)
[facility] CONSULT NOTE  Patient Care Team: Tracie Harrier, MD as PCP - General (Internal Medicine)  CHIEF COMPLAINTS/PURPOSE OF CONSULTATION:  Anemia  HISTORY OF PRESENTING ILLNESS:  [name] [name] 72 y.o.  female a history of COPD/hypertension and long mild history of anemia  with hemoglobin around 10-11 for the last 2 years or so is currently to the hospital for worsening fatigue/shortness of breath.  On admission patient was noted to have a hemoglobin of 5.6; interestingly with MCV of 82.5.  Patient status post PRBC transfusion.  Patient also noted to have Hemoccult positive stool; she is currently being evaluated by GI for upper and lower endoscopy.  Patient denies any blood in stools black or stools.  She denies any previous colonoscopies.  ROS: Patient has not lost any weight.  Does complain of shortness of breath with exertion.  No nausea vomiting.  A complete 10 point review of system is done which is negative except mentioned above in history of present illness  MEDICAL HISTORY:  Past Medical History:  Diagnosis Date  . COPD (chronic obstructive pulmonary disease) ([place])   . Hypertension     SURGICAL HISTORY: Past Surgical History:  Procedure Laterality Date  . ABDOMINAL HYSTERECTOMY      SOCIAL HISTORY: Social History   Socioeconomic History  . Marital status: Married    Spouse name: Not on file  . Number of children: Not on file  . Years of education: Not on file  . Highest education level: Not on file  Occupational History  . Not on file  Social Needs  . Financial resource strain: Not on file  . Food insecurity:    Worry: Not on file    Inability: Not on file  . Transportation needs:    Medical: Not on file    Non-medical: Not on file  Tobacco Use  . Smoking status: Former Research scientist (life sciences)  . Smokeless tobacco: Never Used  Substance and Sexual Activity  . Alcohol use: No  . Drug use: Not on file  . Sexual activity: Not Currently  Lifestyle  .  Physical activity:    Days per week: Not on file    Minutes per session: Not on file  . Stress: Not on file  Relationships  . Social connections:    Talks on phone: Not on file    Gets together: Not on file    Attends religious service: Not on file    Active member of club or organization: Not on file    Attends meetings of clubs or organizations: Not on file    Relationship status: Not on file  . Intimate partner violence:    Fear of current or ex partner: Not on file    Emotionally abused: Not on file    Physically abused: Not on file    Forced sexual activity: Not on file  Other Topics Concern  . Not on file  Social History Narrative  . Not on file    FAMILY HISTORY: Family History  Problem Relation Age of Onset  . Hypertension Mother   . Melanoma Father     ALLERGIES:  has No Known Allergies.  MEDICATIONS:  Current Facility-Administered Medications  Medication Dose Route Frequency Provider Last Rate Last Dose  . 0.9 %  sodium chloride infusion   Intravenous Continuous Vaughan Basta, MD 75 mL/hr at 10/29/17 [phone]    . albuterol (PROVENTIL) (2.5 MG/3ML) 0.083% nebulizer solution 3 mL  3 mL Inhalation Q6H PRN Vaughan Basta, MD      .  docusate sodium (COLACE) capsule 100 mg  100 mg Oral BID PRN Vaughan Basta, MD      . mometasone-formoterol [facility]) 200-5 MCG/ACT inhaler 2 puff  2 puff Inhalation BID Vaughan Basta, MD   2 puff at 10/29/17 [id number]  . ondansetron (ZOFRAN) injection 4 mg  4 mg Intravenous Q6H PRN Vaughan Basta, MD      . pantoprazole (PROTONIX) 80 mg in sodium chloride 0.9 % 250 mL (0.32 mg/mL) infusion  8 mg/hr Intravenous Continuous Nance Pear, MD 25 mL/hr at 10/29/17 0519 8 mg/hr at 10/29/17 0519  . polyethylene glycol-electrolytes (NuLYTELY/GoLYTELY) solution 4,000 mL  4,000 mL Oral Once Croley, Granville M, PA-C      . potassium chloride SA (K-DUR,KLOR-CON) CR tablet 20 mEq  20 mEq Oral BID Vaughan Basta, MD   20 mEq at 10/29/17 0811  . predniSONE (DELTASONE) tablet 5 mg  5 mg Oral Daily Vaughan Basta, MD   5 mg at 10/29/17 0949  . tiotropium (SPIRIVA) inhalation capsule 18 mcg  18 mcg Inhalation QPM Vaughan Basta, MD          .  PHYSICAL EXAMINATION:  Vitals:   10/29/17 1645 10/29/17 1709  BP: 94/62 (!) 108/57  Pulse: (!) 106 (!) 107  Resp: 18 18  Temp: 98.7 F (37.1 C) 99.1 F (37.3 C)  SpO2: 99% 100%   Filed Weights   10/28/17 1628 10/28/17 2127  Weight: 188 lb (85.3 kg) 174 lb 13.2 oz (79.3 kg)    GENERAL: Well-nourished well-developed; Alert, no distress and comfortable.   Alone. EYES: Positive for pallor no icterus. OROPHARYNX: no thrush or ulceration. NECK: supple, no masses felt LYMPH:  no palpable lymphadenopathy in the cervical, axillary or inguinal regions LUNGS: decreased breath sounds to auscultation at bases and  No wheeze or crackles HEART/CVS: regular rate & rhythm and no murmurs; No lower extremity edema ABDOMEN: abdomen soft, non-tender and normal bowel sounds Musculoskeletal:no cyanosis of digits and no clubbing  PSYCH: alert & oriented x 3 with fluent speech NEURO: no focal motor/sensory deficits SKIN:  no rashes or significant lesions  LABORATORY DATA:  I have reviewed the data as listed Lab Results  Component Value Date   WBC 10.6 10/29/2017   HGB 6.4 (L) 10/29/2017   HCT 20.3 (L) 10/29/2017   MCV 82.5 10/29/2017   PLT 404 10/29/2017   Recent Labs    10/28/17 1632 10/29/17 0442  NA 141 144  K 3.0* 3.3*  CL 105 109  CO2 30 29  GLUCOSE 153* 116*  BUN 16 13  CREATININE 0.82 0.83  CALCIUM 8.6* 8.1*  GFRNONAA >60 >60  GFRAA >60 >60    RADIOGRAPHIC STUDIES: I have personally reviewed the radiological images as listed and agreed with the findings in the report. Dg Chest 2 View  Result Date: 10/28/2017 CLINICAL DATA:  Short of breath EXAM: CHEST - 2 VIEW COMPARISON:  09/05/2014 FINDINGS: Linear scarring  or atelectasis at the right base. No acute consolidation or pleural effusion. Borderline cardiomegaly. Aortic atherosclerosis. No pneumothorax. IMPRESSION: No active cardiopulmonary disease. Borderline cardiomegaly. Linear atelectasis or scarring at the right base. Electronically Signed   By: Donavan Foil M.D.   On: 10/28/2017 17:05    ASSESSMENT & PLAN:   #72 year old female patient with chronic anemia currently admitted to the hospital for severe anemia  # Acute on chronic anemia-nadir hemoglobin 5.5; status post PRBC transfusion improved currently at 6.4.  The etiology of anemia is unclear-patient is not microcytic as that would  be expected from severe iron deficiency.  However check iron studies/ferritin folic acid I71 reticulocyte count multiple myeloma panel kappa lambda light chain.  Haptoglobin.   # Hemoccult positive stools-patient awaiting EGD/colonoscopy.  If endoscopies are not contributory-see to the abdomen pelvis would be reasonable to look for other cause of anemia.  Thank you Dr.Gouru for allowing me to participate in the care of your pleasant patient. Please do not hesitate to contact me with questions or concerns in the interim. All questions were answered. The patient knows to call the clinic with any problems, questions or concerns.  Addendum: Iron studies show saturation 5%; ferritin 15-which is disproportionate to her severity of anemia.  Question any concurrent etiology versus other etiology.  I would recommend IV iron infusion; and if hemoglobin not significantly improved-I would recommend a bone marrow biopsy as outpatient.   Cammie Sickle, MD 10/29/2017 5:12 PM

## 2017-10-29 NOTE — Consult Note (Signed)
GI Inpatient Consult Note  Reason for Consult: Symptomatic anemia, heme positive stool   Attending Requesting Consult: Dr. Elisabeth Pigeon, MD  History of Present Illness: [name] [name] is a 72 y.o. female seen for evaluation of symptomatic anemia, heme positive stool at the request of Dr. Elisabeth Pigeon, MD. [name] [name] has a PMH of COPD, chronic anemia, HTN who was admitted for increasing fatigue, dizziness, and weakness. Hemoglobin in ED was noted to be 5.5 and pt was guaiac positive. Pt received blood transufsion and hemoglobin this morning was 6.1.  Pt seen and examined this afternoon resting comfortably in hospital bed. She reports no acute complaints besides the fatigue and weakness. She reports on average she is having a formed BM about every 3 days. Over the past week she endorses some black, tarry appearing stools. She has never noticed this before. She does take daily iron pills and has been doing this for years. No frequent NSAID, BC, or Goody powder use. She denies nausea, vomiting, dysphagia, odynophagia, early satiety, abdominal pain, or overt bleeding. She has never had a colonoscopy before. No known family hx of colon cancer, polyps, or IBD. She has remained NPO since admission.    Last EGD/Colonoscopy: Never     Past Medical History:  Past Medical History:  Diagnosis Date  . COPD (chronic obstructive pulmonary disease) ([facility])   . Hypertension     Problem List: Patient Active Problem List   Diagnosis Date Noted  . Symptomatic anemia 10/28/2017  . GI bleed 10/28/2017  . COPD exacerbation ([facility]) 09/04/2014  . Acute respiratory failure with hypercapnia ([facility]) 09/04/2014    Past Surgical History: Past Surgical History:  Procedure Laterality Date  . ABDOMINAL HYSTERECTOMY      Allergies: No Known Allergies  Home Medications: Medications Prior to Admission  Medication Sig Dispense Refill Last Dose  . albuterol (PROVENTIL HFA;VENTOLIN HFA) 108 (90 Base) MCG/ACT inhaler Inhale 2 puffs  into the lungs every 6 (six) hours as needed for wheezing or shortness of breath.   PRN at PRN  . albuterol (PROVENTIL) (2.5 MG/3ML) 0.083% nebulizer solution Inhale 3 mLs into the lungs every 6 (six) hours as needed for wheezing.    PRN at PRN  . aspirin EC 81 MG EC tablet Take 1 tablet (81 mg total) by mouth daily. 30 tablet 2 10/28/2017 at am  . cholecalciferol (VITAMIN D) 1000 units tablet Take 1,000 Units by mouth daily with lunch.   10/27/2017 at pm  . docusate sodium (COLACE) 100 MG capsule Take 100 mg by mouth 2 (two) times daily as needed for mild constipation or moderate constipation.    PRN at PRN  . ferrous sulfate 325 (65 FE) MG tablet Take 325 mg by mouth daily.   10/28/2017 at am  . Fluticasone-Salmeterol (ADVAIR) 500-50 MCG/DOSE AEPB Inhale 1 puff into the lungs every 12 (twelve) hours.   10/28/2017 at am  . furosemide (LASIX) 20 MG tablet Take 20 mg by mouth daily with lunch.   10/27/2017 at pm  . predniSONE (DELTASONE) 5 MG tablet Take 5 mg by mouth daily.   10/28/2017 at am  . tiotropium (SPIRIVA) 18 MCG inhalation capsule Place 1 capsule (18 mcg total) into inhaler and inhale daily. (Patient taking differently: Place 18 mcg into inhaler and inhale daily with lunch. ) 30 capsule 12 10/27/2017 at pm  . vitamin C (ASCORBIC ACID) 500 MG tablet Take 500 mg by mouth daily.   10/28/2017 at am   Home medication reconciliation was completed with  the patient.   Scheduled Inpatient Medications:   . mometasone-formoterol  2 puff Inhalation BID  . potassium chloride  20 mEq Oral BID  . predniSONE  5 mg Oral Daily  . tiotropium  18 mcg Inhalation QPM    Continuous Inpatient Infusions:   . sodium chloride 75 mL/hr at 10/29/17 0952  . pantoprozole (PROTONIX) infusion 8 mg/hr (10/29/17 0519)    PRN Inpatient Medications:  albuterol, docusate sodium, ondansetron (ZOFRAN) IV  Family History: family history includes Hypertension in her mother; Melanoma in her father.  The patient's family  history is negative for inflammatory bowel disorders, GI malignancy, or solid organ transplantation.  Social History:   reports that she has quit smoking. She has never used smokeless tobacco. She reports that she does not drink alcohol. The patient denies ETOH, tobacco, or drug use.   Review of Systems: Constitutional: Weight is stable.  Eyes: No changes in vision. ENT: No oral lesions, sore throat.  GI: see HPI.  Heme/Lymph: No easy bruising.  CV: No chest pain.  GU: No hematuria.  Integumentary: No rashes.  Neuro: No headaches.  Psych: No depression/anxiety.  Endocrine: No heat/cold intolerance.  Allergic/Immunologic: No urticaria.  Resp: No cough, SOB.  Musculoskeletal: No joint swelling.    Physical Examination: BP (!) 99/43 (BP Location: Right Arm)   Pulse (!) 106   Temp 99.1 F (37.3 C) (Oral)   Resp 16   Ht 5\' 7"  (1.702 m)   Wt 79.3 kg   SpO2 99%   BMI 27.38 kg/m  Pleasant, obese AA female resting comfortably in hospital bed. Answers all questions appropriately.  Gen: NAD, alert and oriented x 4 HEENT: PEERLA, EOMI, Neck: supple, no JVD or thyromegaly Chest: CTA bilaterally, no wheezes, crackles, or other adventitious sounds CV: RRR, no m/g/c/r Abd: soft, NT, ND, +BS in all four quadrants; no HSM, guarding, ridigity, or rebound tenderness Ext: no edema, well perfused with 2+ pulses, Skin: no rash or lesions noted Lymph: no LAD  Data: Lab Results  Component Value Date   WBC 10.6 10/29/2017   HGB 6.1 (L) 10/29/2017   HCT 19.2 (L) 10/29/2017   MCV 82.5 10/29/2017   PLT 404 10/29/2017   Recent Labs  Lab 10/28/17 1632 10/29/17 0442 10/29/17 1039  HGB 5.5* 6.4* 6.1*   Lab Results  Component Value Date   NA 144 10/29/2017   K 3.3 (L) 10/29/2017   CL 109 10/29/2017   CO2 29 10/29/2017   BUN 13 10/29/2017   CREATININE 0.83 10/29/2017   Lab Results  Component Value Date   ALT 23 09/04/2014   AST 34 09/04/2014   ALKPHOS 85 09/04/2014   BILITOT 0.6  09/04/2014   No results for input(s): APTT, INR, PTT in the last 168 hours. Assessment/Plan: [name] [name] is a 72 y/o AA female with a PMH of chronic anemia, COPD on 2L home O2, and hypertension admitted for symptomatic anemia, heme positive stool  1. Symptomatic anemia s/t presumed GI bleed 2. Heme positive stool - Due to presence of presumed melena in setting of symptomatic anemia, suspicious for possible UGI bleed. DDx includes PUD +/- H pylori, gastritis, duodenitis, malignancy.  - Pt is colonoscopy naive, neg family hx of colon cancer. Due to anemia exacerbation and heme positive stool with no prior colonoscopy, pt would benefit from colonoscopy in hospital to rule out lower GI bleed, polyps, or malignancy.  - Continue Protonix IV. Avoid all NSAIDs and anticoagulation. - Continue to transfuse Hg <7. -  Plan for EGD/Colonoscopy tomorrow with Dr. Norma Fredricksonoledo - Clear liquid diet today. Pt will complete bowel prep starting 1700 today. NPO after 0500 tomorrow.  - Discussed procedure details and indications with pt. We discussed potential complications, including bleeding, infection, small puncture to intestine or internal organs, or problems with anesthesia. She consents to proceed.  Recommendations: - Clear liquid diet today. Bowel prep starting 1700 today. NPO after 0500 tomorrow - EGD/Colonoscopy with Dr. Norma Fredricksonoledo tomorrow  Thank you for the consult. Please call with questions or concerns.  Gilda CreaseGranville M Jazyah Butsch, PA-C [facility]

## 2017-10-29 NOTE — Progress Notes (Signed)
[facility] [place] [facility]   PATIENT NAME: [name] [name]    MR#:  [id number]  DATE OF BIRTH:  23-Oct-1945  SUBJECTIVE:  CHIEF COMPLAINT: Patient is reporting no vomiting or abdominal pain.  Denies any active bleeding.  Just reporting fatigue and weakness Patient never had EGD or colonoscopy done though it was suggested by her primary care physician.  Denies drinking alcohol or taking NSAIDs Goody powders  REVIEW OF SYSTEMS:  CONSTITUTIONAL: No fever, fatigue or weakness.  EYES: No blurred or double vision.  EARS, NOSE, AND THROAT: No tinnitus or ear pain.  RESPIRATORY: No cough, shortness of breath, wheezing or hemoptysis.  CARDIOVASCULAR: No chest pain, orthopnea, edema.  GASTROINTESTINAL: No nausea, vomiting, diarrhea or abdominal pain.  GENITOURINARY: No dysuria, hematuria.  ENDOCRINE: No polyuria, nocturia,  HEMATOLOGY: No anemia, easy bruising or bleeding SKIN: No rash or lesion. MUSCULOSKELETAL: No joint pain or arthritis.   NEUROLOGIC: No tingling, numbness, weakness.  PSYCHIATRY: No anxiety or depression.   DRUG ALLERGIES:  No Known Allergies  VITALS:  Blood pressure (!) 99/43, pulse (!) 106, temperature 99.1 F (37.3 C), temperature source Oral, resp. rate 16, height 5\' 7"  (1.702 m), weight 79.3 kg, SpO2 99 %.  PHYSICAL EXAMINATION:  GENERAL:  72 y.o.-year-old patient lying in the bed with no acute distress.  EYES: Pupils equal, round, reactive to light and accommodation. No scleral icterus. Extraocular muscles intact.  HEENT: Head atraumatic, normocephalic. Oropharynx and nasopharynx clear.  NECK:  Supple, no jugular venous distention. No thyroid enlargement, no tenderness.  LUNGS: Normal breath sounds bilaterally, no wheezing, rales,rhonchi or crepitation. No use of accessory muscles of respiration.  CARDIOVASCULAR: S1, S2 normal. No murmurs, rubs, or gallops.  ABDOMEN: Soft, nontender, nondistended. Bowel sounds present. No  organomegaly or mass.  EXTREMITIES: No pedal edema, cyanosis, or clubbing.  NEUROLOGIC: Cranial nerves II through XII are intact. Muscle strength 5/5 in all extremities. Sensation intact. Gait not checked.  PSYCHIATRIC: The patient is alert and oriented x 3.  SKIN: No obvious rash, lesion, or ulcer.    LABORATORY PANEL:   CBC Recent Labs  Lab 10/29/17 0442 10/29/17 1039  WBC 10.6  --   HGB 6.4* 6.1*  HCT 19.3* 19.2*  PLT 404  --    ------------------------------------------------------------------------------------------------------------------  Chemistries  Recent Labs  Lab 10/29/17 0442  NA 144  K 3.3*  CL 109  CO2 29  GLUCOSE 116*  BUN 13  CREATININE 0.83  CALCIUM 8.1*   ------------------------------------------------------------------------------------------------------------------  Cardiac Enzymes No results for input(s): TROPONINI in the last 168 hours. ------------------------------------------------------------------------------------------------------------------  RADIOLOGY:  Dg Chest 2 View  Result Date: 10/28/2017 CLINICAL DATA:  Short of breath EXAM: CHEST - 2 VIEW COMPARISON:  09/05/2014 FINDINGS: Linear scarring or atelectasis at the right base. No acute consolidation or pleural effusion. Borderline cardiomegaly. Aortic atherosclerosis. No pneumothorax. IMPRESSION: No active cardiopulmonary disease. Borderline cardiomegaly. Linear atelectasis or scarring at the right base. Electronically Signed   By: Jasmine PangKim  Fujinaga M.D.   On: 10/28/2017 17:05    EKG:   Orders placed or performed during the hospital encounter of 10/28/17  . EKG 12-Lead  . EKG 12-Lead  . ED EKG  . ED EKG    ASSESSMENT AND PLAN:    *Symptomatic anemia Due to blood loss over her chronic iron deficiency anemia. 1 unit of blood transfusion given still hemoglobin is at 6.1.  Will give another 2 units of blood Gentle hydration with IV fluids Seen by gastroenterology planning to  do  EGD and colonoscopy in a.m. Clear liquid diet Stool for guaiac is positive.  GI consult for further consideration. Protonix IV drip.  Avoid anticoagulants.  *COPD Continue inhalers and nebulizer therapy.  *Hypertension We will hold medications for now.   *Hypokalemia start replacement oral.    All the records are reviewed and case discussed with Care Management/Social Workerr. Management plans discussed with the patient, family and they are in agreement.  CODE STATUS: fc TOTAL TIME TAKING CARE OF THIS PATIENT: 36  minutes.   POSSIBLE D/C IN 2  DAYS, DEPENDING ON CLINICAL CONDITION.  Note: This dictation was prepared with Dragon dictation along with smaller phrase technology. Any transcriptional errors that result from this process are unintentional.   Ramonita LabAruna Adonia Porada M.D on 10/29/2017 at 2:48 PM  Between 7am to 6pm - Pager - [phone] After 6pm go to [email or web address] - password [email or web address] [facility]  [facility]  Office  [phone]  CC: Primary care physician; Barbette ReichmannHande, Vishwanath, MD

## 2017-10-30 ENCOUNTER — Inpatient Hospital Stay: Payer: Medicare Other | Admitting: Anesthesiology

## 2017-10-30 ENCOUNTER — Encounter: Payer: Self-pay | Admitting: Anesthesiology

## 2017-10-30 ENCOUNTER — Encounter
Admission: EM | Disposition: A | Discharge status: Home / Self Care | Payer: Self-pay | Source: Ambulatory Visit | Attending: Internal Medicine

## 2017-10-30 HISTORY — PX: COLONOSCOPY: SHX5424

## 2017-10-30 HISTORY — PX: ESOPHAGOGASTRODUODENOSCOPY: SHX5428

## 2017-10-30 LAB — KAPPA/LAMBDA LIGHT CHAINS
Kappa free light chain: 17.5 mg/L (ref 3.3–19.4)
Kappa, lambda light chain ratio: 1.07 (ref 0.26–1.65)
Lambda free light chains: 16.4 mg/L (ref 5.7–26.3)

## 2017-10-30 LAB — CBC
HCT: 27.7 % — ABNORMAL LOW (ref 35.0–47.0)
Hemoglobin: 9.1 g/dL — ABNORMAL LOW (ref 12.0–16.0)
MCH: 27.4 pg (ref 26.0–34.0)
MCHC: 32.9 g/dL (ref 32.0–36.0)
MCV: 83.1 fL (ref 80.0–100.0)
PLATELETS: 417 10*3/uL (ref 150–440)
RBC: 3.33 MIL/uL — AB (ref 3.80–5.20)
RDW: 16.2 % — AB (ref 11.5–14.5)
WBC: 10.4 10*3/uL (ref 3.6–11.0)

## 2017-10-30 LAB — BPAM RBC
BLOOD PRODUCT EXPIRATION DATE: 201908172359
Blood Product Expiration Date: 201908162359
Blood Product Expiration Date: 201908162359
ISSUE DATE / TIME: 201908131925
ISSUE DATE / TIME: 201908142022
ISSUE DATE / TIME: 201908141642
UNIT TYPE AND RH: 5100
Unit Type and Rh: 5100
Unit Type and Rh: 9500

## 2017-10-30 LAB — BASIC METABOLIC PANEL
ANION GAP: 5 (ref 5–15)
BUN: 9 mg/dL (ref 8–23)
CALCIUM: 8.3 mg/dL — AB (ref 8.9–10.3)
CO2: 27 mmol/L (ref 22–32)
Chloride: 112 mmol/L — ABNORMAL HIGH (ref 98–111)
Creatinine, Ser: 0.67 mg/dL (ref 0.44–1.00)
Glucose, Bld: 105 mg/dL — ABNORMAL HIGH (ref 70–99)
POTASSIUM: 4.1 mmol/L (ref 3.5–5.1)
Sodium: 144 mmol/L (ref 135–145)

## 2017-10-30 LAB — TYPE AND SCREEN
ABO/RH(D): O POS
Antibody Screen: NEGATIVE
Unit division: 0
Unit division: 0
Unit division: 0

## 2017-10-30 LAB — HAPTOGLOBIN: HAPTOGLOBIN: 202 mg/dL — AB (ref 34–200)

## 2017-10-30 SURGERY — EGD (ESOPHAGOGASTRODUODENOSCOPY)
Anesthesia: General

## 2017-10-30 MED ORDER — PROPOFOL 500 MG/50ML IV EMUL
INTRAVENOUS | Status: AC
  Filled 2017-10-30: qty 50

## 2017-10-30 MED ORDER — FENTANYL CITRATE (PF) 100 MCG/2ML IJ SOLN
INTRAMUSCULAR | Status: AC
  Filled 2017-10-30: qty 2

## 2017-10-30 MED ORDER — LIDOCAINE HCL (PF) 2 % IJ SOLN
INTRAMUSCULAR | Status: AC
  Filled 2017-10-30: qty 10

## 2017-10-30 MED ORDER — CYANOCOBALAMIN 1000 MCG/ML IJ SOLN
1000.0000 ug | Freq: Every day | INTRAMUSCULAR | Status: AC
  Administered 2017-10-30 – 2017-10-31 (×2): 1000 ug via INTRAMUSCULAR
  Filled 2017-10-30 (×2): qty 1

## 2017-10-30 MED ORDER — FENTANYL CITRATE (PF) 100 MCG/2ML IJ SOLN
INTRAMUSCULAR | Status: DC | PRN
  Administered 2017-10-30: 50 ug via INTRAVENOUS
  Administered 2017-10-30 (×2): 25 ug via INTRAVENOUS

## 2017-10-30 MED ORDER — LIDOCAINE HCL (CARDIAC) PF 100 MG/5ML IV SOSY
PREFILLED_SYRINGE | INTRAVENOUS | Status: DC | PRN
  Administered 2017-10-30: 30 mg via INTRAVENOUS

## 2017-10-30 MED ORDER — PHENYLEPHRINE HCL 10 MG/ML IJ SOLN
INTRAMUSCULAR | Status: DC | PRN
  Administered 2017-10-30 (×2): 50 ug via INTRAVENOUS

## 2017-10-30 MED ORDER — EPINEPHRINE PF 1 MG/10ML IJ SOSY
PREFILLED_SYRINGE | INTRAMUSCULAR | Status: DC | PRN
  Administered 2017-10-30: 5 mL

## 2017-10-30 MED ORDER — PROPOFOL 500 MG/50ML IV EMUL
INTRAVENOUS | Status: DC | PRN
  Administered 2017-10-30: 120 ug/kg/min via INTRAVENOUS

## 2017-10-30 MED ORDER — PROPOFOL 10 MG/ML IV BOLUS
INTRAVENOUS | Status: AC
  Filled 2017-10-30: qty 20

## 2017-10-30 MED ORDER — PHENYLEPHRINE HCL 10 MG/ML IJ SOLN
INTRAMUSCULAR | Status: AC
  Filled 2017-10-30: qty 1

## 2017-10-30 NOTE — Anesthesia Post-op Follow-up Note (Signed)
Anesthesia QCDR form completed.        

## 2017-10-30 NOTE — Anesthesia Preprocedure Evaluation (Addendum)
Anesthesia Evaluation  Patient identified by MRN, date of birth, ID band Patient awake    Reviewed: Allergy & Precautions, H&P , NPO status , Patient's Chart, lab work & pertinent test results  Airway Mallampati: III  TM Distance: >3 FB Neck ROM: full    Dental  (+) Upper Dentures, Edentulous Lower   Pulmonary neg pulmonary ROS, COPD (2L O2 ATC at home),  COPD inhaler and oxygen dependent, former smoker,     + decreased breath sounds      Cardiovascular hypertension, negative cardio ROS   Rhythm:regular Rate:Normal     Neuro/Psych negative neurological ROS  negative psych ROS   GI/Hepatic Neg liver ROS, GI bleed   Endo/Other  negative endocrine ROS  Renal/GU negative Renal ROS  negative genitourinary   Musculoskeletal   Abdominal   Peds  Hematology  (+) anemia ,   Anesthesia Other Findings Presented with fatigue, SOB, Hgb 5.6 (now 9.1 after pRBC) and hemoccult positive stool. Presenting for EGD and colonoscopy.  Past Medical History: No date: COPD (chronic obstructive pulmonary disease) ([facility]) No date: Hypertension  Past Surgical History: No date: ABDOMINAL HYSTERECTOMY  BMI    Body Mass Index:  29.44 kg/m      Reproductive/Obstetrics negative OB ROS                          Anesthesia Physical Anesthesia Plan  ASA: III  Anesthesia Plan: General   Post-op Pain Management:    Induction:   PONV Risk Score and Plan: Propofol infusion and TIVA  Airway Management Planned:   Additional Equipment:   Intra-op Plan:   Post-operative Plan:   Informed Consent: I have reviewed the patients History and Physical, chart, labs and discussed the procedure including the risks, benefits and alternatives for the proposed anesthesia with the patient or authorized representative who has indicated his/her understanding and acceptance.   Dental Advisory Given  Plan Discussed with:  Anesthesiologist, CRNA and Surgeon  Anesthesia Plan Comments:         Anesthesia Quick Evaluation

## 2017-10-30 NOTE — Op Note (Signed)
[facility] Gastroenterology Patient Name: [name] [name] Procedure Date: 10/30/2017 1:16 PM MRN: [id number] Account #: [id number] Date of Birth: 25-Feb-1946 Admit Type: Inpatient Age: 72 Room: [facility] ENDO ROOM 1 Gender: Female Note Status: Finalized Procedure:            Upper GI endoscopy Indications:          Recent gastrointestinal bleeding Providers:            Boykin Nearing. Norma Fredrickson MD, MD Referring MD:         Barbette Reichmann, MD (Referring MD) Medicines:            Propofol per Anesthesia Complications:        No immediate complications. Procedure:            Pre-Anesthesia Assessment:                       - The risks and benefits of the procedure and the                        sedation options and risks were discussed with the                        patient. All questions were answered and informed                        consent was obtained.                       - Patient identification and proposed procedure were                        verified prior to the procedure by the nurse. The                        procedure was verified in the procedure room.                       - ASA Grade Assessment: III - A patient with severe                        systemic disease.                       - After reviewing the risks and benefits, the patient                        was deemed in satisfactory condition to undergo the                        procedure.                       After obtaining informed consent, the endoscope was                        passed under direct vision. Throughout the procedure,                        the patient's blood pressure, pulse, and oxygen  saturations were monitored continuously. The Endoscope                        was introduced through the mouth, and advanced to the                        third part of duodenum. The upper GI endoscopy was                        accomplished without difficulty. The patient  tolerated                        the procedure well. Findings:      One benign-appearing, intrinsic mild stenosis was found at the lower       esophageal sphincter. This stenosis measured 1.5 cm (inner diameter) x       less than one cm (in length). The stenosis was traversed.      Patchy mildly erythematous mucosa without bleeding was found in the       gastric body. Biopsies were taken with a cold forceps for Helicobacter       pylori testing.      One spurting cratered duodenal ulcer with a visible vessel was found in       the duodenal bulb. The lesion was 7 mm in largest dimension. Area was       unsuccessfully injected with 4 mL of a 1:10,000 solution of epinephrine       for hemostasis. Coagulation for hemostasis using bipolar probe was       successful. Estimated blood loss was minimal.      A 1 cm hiatal hernia was present.      The exam was otherwise without abnormality. Impression:           - Benign-appearing esophageal stenosis.                       - Erythematous mucosa in the gastric body. Biopsied.                       - One spurting duodenal ulcer with a visible vessel.                        Treatment not successful. Treated with bipolar cautery.                       - 1 cm hiatal hernia.                       - The examination was otherwise normal. Recommendation:       - Await pathology results from EGD, also performed                        today.                       - Proceed with colonoscopy Procedure Code(s):    --- Professional ---                       43255, 59, Esophagogastroduodenoscopy, flexible,  transoral; with control of bleeding, any method                       43239, Esophagogastroduodenoscopy, flexible, transoral;                        with biopsy, single or multiple Diagnosis Code(s):    --- Professional ---                       K92.2, Gastrointestinal hemorrhage, unspecified                       K44.9, Diaphragmatic  hernia without obstruction or                        gangrene                       K26.4, Chronic or unspecified duodenal ulcer with                        hemorrhage                       K31.89, Other diseases of stomach and duodenum                       K22.2, Esophageal obstruction CPT copyright 2017 American Medical Association. All rights reserved. The codes documented in this report are preliminary and upon coder review may  be revised to meet current compliance requirements. Stanton Kidneyeodoro K Toledo MD, MD 10/30/2017 1:42:32 PM This report has been signed electronically. Number of Addenda: 0 Note Initiated On: 10/30/2017 1:16 PM      [facility]

## 2017-10-30 NOTE — Anesthesia Procedure Notes (Signed)
Performed by: Cook-Martin, Kauan Kloosterman Pre-anesthesia Checklist: Patient identified, Emergency Drugs available, Suction available, Patient being monitored and Timeout performed Patient Re-evaluated:Patient Re-evaluated prior to induction Oxygen Delivery Method: Nasal cannula Preoxygenation: Pre-oxygenation with 100% oxygen Induction Type: IV induction Airway Equipment and Method: Bite block Placement Confirmation: positive ETCO2 and CO2 detector       

## 2017-10-30 NOTE — Progress Notes (Signed)
[place] [facility] [place] NAME: [name] [name]    MR#:  [id number]  DATE OF BIRTH:  05-20-1945  SUBJECTIVE:  CHIEF COMPLAINT: Patient is doing fine.  Received 3 units of blood transfusion yesterday.  Dizziness improving Patient never had EGD or colonoscopy done though it was suggested by her primary care physician.  Denies drinking alcohol or taking NSAIDs Goody powders  REVIEW OF SYSTEMS:  CONSTITUTIONAL: No fever, fatigue or weakness.  EYES: No blurred or double vision.  EARS, NOSE, AND THROAT: No tinnitus or ear pain.  RESPIRATORY: No cough, shortness of breath, wheezing or hemoptysis.  CARDIOVASCULAR: No chest pain, orthopnea, edema.  GASTROINTESTINAL: No nausea, vomiting, diarrhea or abdominal pain.  GENITOURINARY: No dysuria, hematuria.  ENDOCRINE: No polyuria, nocturia,  HEMATOLOGY: No anemia, easy bruising or bleeding SKIN: No rash or lesion. MUSCULOSKELETAL: No joint pain or arthritis.   NEUROLOGIC: No tingling, numbness, weakness.  PSYCHIATRY: No anxiety or depression.   DRUG ALLERGIES:  No Known Allergies  VITALS:  Blood pressure 113/67, pulse (!) 105, temperature 99.1 F (37.3 C), temperature source Oral, resp. rate 18, height '5\' 7"'$  (1.702 m), weight 85.3 kg, SpO2 99 %.  PHYSICAL EXAMINATION:  GENERAL:  72 y.o.-year-old patient lying in the bed with no acute distress.  EYES: Pupils equal, round, reactive to light and accommodation. No scleral icterus. Extraocular muscles intact.  HEENT: Head atraumatic, normocephalic. Oropharynx and nasopharynx clear.  NECK:  Supple, no jugular venous distention. No thyroid enlargement, no tenderness.  LUNGS: Normal breath sounds bilaterally, no wheezing, rales,rhonchi or crepitation. No use of accessory muscles of respiration.  CARDIOVASCULAR: S1, S2 normal. No murmurs, rubs, or gallops.  ABDOMEN: Soft, nontender, nondistended. Bowel sounds present. No organomegaly or mass.  EXTREMITIES:  No pedal edema, cyanosis, or clubbing.  NEUROLOGIC: Cranial nerves II through XII are intact. Muscle strength 5/5 in all extremities. Sensation intact. Gait not checked.  PSYCHIATRIC: The patient is alert and oriented x 3.  SKIN: No obvious rash, lesion, or ulcer.    LABORATORY PANEL:   CBC Recent Labs  Lab 10/30/17 0002  WBC 10.4  HGB 9.1*  HCT 27.7*  PLT 417   ------------------------------------------------------------------------------------------------------------------  Chemistries  Recent Labs  Lab 10/30/17 0002  NA 144  K 4.1  CL 112*  CO2 27  GLUCOSE 105*  BUN 9  CREATININE 0.67  CALCIUM 8.3*   ------------------------------------------------------------------------------------------------------------------  Cardiac Enzymes No results for input(s): TROPONINI in the last 168 hours. ------------------------------------------------------------------------------------------------------------------  RADIOLOGY:  Dg Chest 2 View  Result Date: 10/28/2017 CLINICAL DATA:  Short of breath EXAM: CHEST - 2 VIEW COMPARISON:  09/05/2014 FINDINGS: Linear scarring or atelectasis at the right base. No acute consolidation or pleural effusion. Borderline cardiomegaly. Aortic atherosclerosis. No pneumothorax. IMPRESSION: No active cardiopulmonary disease. Borderline cardiomegaly. Linear atelectasis or scarring at the right base. Electronically Signed   By: Donavan Foil M.D.   On: 10/28/2017 17:05    EKG:   Orders placed or performed during the hospital encounter of 10/28/17  . EKG 12-Lead  . EKG 12-Lead  . ED EKG  . ED EKG    ASSESSMENT AND PLAN:    *Symptomatic anemia Due to blood loss over her chronic iron deficiency anemia. 3 units of blood transfusion was given and hemoglobin went up from 6.4 to 9.1 Gentle hydration with IV fluids while n.p.o. Seen by gastroenterology planning to do EGD and colonoscopy today Stool for guaiac is positive.  GI consult for further  consideration.  Protonix IV drip.  Avoid anticoagulants.  *Normocytic anemia from chronic anemia  normal MCV Patient has severe iron deficiency with ferritin at 15 IV iron ordered Outpatient follow-up with Dr. Rogue Bussing She might need bone marrow biopsy if no improvement B12 supplements  *COPD Continue inhalers and nebulizer therapy.  *Hypertension We will hold medications for now.   *Hypokalemia start replacement oral.    All the records are reviewed and case discussed with Care Management/Social Workerr. Management plans discussed with the patient, family and they are in agreement.  CODE STATUS: fc TOTAL TIME TAKING CARE OF THIS PATIENT: 36  minutes.   POSSIBLE D/C IN 2  DAYS, DEPENDING ON CLINICAL CONDITION.  Note: This dictation was prepared with Dragon dictation along with smaller phrase technology. Any transcriptional errors that result from this process are unintentional.   Nicholes Mango M.D on 10/30/2017 at 4:28 PM  Between 7am to 6pm - Pager - [phone] After 6pm go to [email or web address] - password [email or web address] [facility]  Office  [phone]  CC: Primary care physician; Tracie Harrier, MD

## 2017-10-30 NOTE — Anesthesia Postprocedure Evaluation (Signed)
Anesthesia Post Note  Patient: [name] [name]  Procedure(s) Performed: ESOPHAGOGASTRODUODENOSCOPY (EGD) (N/A ) COLONOSCOPY (N/A )  Patient location during evaluation: PACU Anesthesia Type: General Level of consciousness: awake and alert Pain management: pain level controlled Vital Signs Assessment: post-procedure vital signs reviewed and stable Respiratory status: spontaneous breathing, nonlabored ventilation and respiratory function stable Cardiovascular status: blood pressure returned to baseline and stable Postop Assessment: no apparent nausea or vomiting Anesthetic complications: no     Last Vitals:  Vitals:   10/30/17 1455 10/30/17 1531  BP: 122/77 113/67  Pulse: 97 (!) 105  Resp:  18  Temp:  37.3 C  SpO2: 97% 99%    Last Pain:  Vitals:   10/30/17 1531  TempSrc: Oral  PainSc:                  Jovita GammaKathryn L Fitzgerald

## 2017-10-30 NOTE — Transfer of Care (Signed)
Immediate Anesthesia Transfer of Care Note  Patient: [name] [name]  Procedure(s) Performed: ESOPHAGOGASTRODUODENOSCOPY (EGD) (N/A ) COLONOSCOPY (N/A )  Patient Location: PACU  Anesthesia Type:General  Level of Consciousness: awake and sedated  Airway & Oxygen Therapy: Patient Spontanous Breathing and Patient connected to nasal cannula oxygen  Post-op Assessment: Report given to RN and Post -op Vital signs reviewed and stable  Post vital signs: Reviewed and stable  Last Vitals:  Vitals Value Taken Time  BP    Temp    Pulse    Resp    SpO2      Last Pain:  Vitals:   10/30/17 1236  TempSrc: Tympanic  PainSc: 0-No pain         Complications: No apparent anesthesia complications

## 2017-10-30 NOTE — Op Note (Signed)
[facility] Gastroenterology Patient Name: [name] [name] Procedure Date: 10/30/2017 1:15 PM MRN: [id number] Account #: [id number] Date of Birth: [birth year] Admit Type: Inpatient Age: 72 Room: [facility] ENDO ROOM 1 Gender: Female Note Status: Finalized Procedure:            Colonoscopy Indications:          Evaluation of unexplained GI bleeding Providers:            Boykin Nearing. Norma Fredrickson MD, MD Referring MD:         Barbette Reichmann, MD (Referring MD) Medicines:            Propofol per Anesthesia Complications:        No immediate complications. Procedure:            Pre-Anesthesia Assessment:                       - The risks and benefits of the procedure and the                        sedation options and risks were discussed with the                        patient. All questions were answered and informed                        consent was obtained.                       - Patient identification and proposed procedure were                        verified prior to the procedure by the nurse. The                        procedure was verified in the procedure room.                       - ASA Grade Assessment: III - A patient with severe                        systemic disease.                       - After reviewing the risks and benefits, the patient                        was deemed in satisfactory condition to undergo the                        procedure.                       After obtaining informed consent, the colonoscope was                        passed under direct vision. Throughout the procedure,                        the patient's blood pressure, pulse, and oxygen  saturations were monitored continuously. The                        Colonoscope was introduced through the anus and                        advanced to the the cecum, identified by appendiceal                        orifice and ileocecal valve. The colonoscopy was           technically difficult and complex due to restricted                        mobility of the colon. Successful completion of the                        procedure was aided by withdrawing the scope and                        replacing with the pediatric colonoscope. The patient                        tolerated the procedure well. The quality of the bowel                        preparation was good. The entire colon was examined. Findings:      The perianal and digital rectal examinations were normal. Pertinent       negatives include normal sphincter tone and no palpable rectal lesions.      Multiple small and large-mouthed diverticula were found in the entire       colon.      A diffuse area of mild melanosis was found in the entire colon.      Non-bleeding internal hemorrhoids were found during retroflexion. The       hemorrhoids were Grade I (internal hemorrhoids that do not prolapse).      The exam was otherwise without abnormality. Impression:           - Diverticulosis in the entire examined colon.                       - Melanosis in the colon.                       - Non-bleeding internal hemorrhoids.                       - The examination was otherwise normal.                       - No specimens collected. Recommendation:       - Return patient to hospital ward for observation.                       - Clear liquid diet today.                       - No aspirin, ibuprofen, naproxen, or other                        non-steroidal anti-inflammatory drugs.                       -  Await pathology results from EGD, also performed                        today.                       - See chart Procedure Code(s):    --- Professional ---                       [id number], Colonoscopy, flexible; diagnostic, including                        collection of specimen(s) by brushing or washing, when                        performed (separate procedure) Diagnosis Code(s):    --- Professional  ---                       K57.30, Diverticulosis of large intestine without                        perforation or abscess without bleeding                       K92.2, Gastrointestinal hemorrhage, unspecified                       K63.89, Other specified diseases of intestine                       K64.0, First degree hemorrhoids CPT copyright 2017 American Medical Association. All rights reserved. The codes documented in this report are preliminary and upon coder review may  be revised to meet current compliance requirements. Stanton Kidneyeodoro K Broox Lonigro MD, MD 10/30/2017 2:15:05 PM This report has been signed electronically. Number of Addenda: 0 Note Initiated On: 10/30/2017 1:15 PM Scope Withdrawal Time: 0 hours 5 minutes 17 seconds  Total Procedure Duration: 0 hours 21 minutes 54 seconds       [facility]

## 2017-10-31 DIAGNOSIS — K922 Gastrointestinal hemorrhage, unspecified: Secondary | ICD-10-CM

## 2017-10-31 DIAGNOSIS — K264 Chronic or unspecified duodenal ulcer with hemorrhage: Principal | ICD-10-CM

## 2017-10-31 LAB — CBC
HEMATOCRIT: 26.3 % — AB (ref 35.0–47.0)
Hemoglobin: 8.7 g/dL — ABNORMAL LOW (ref 12.0–16.0)
MCH: 27.5 pg (ref 26.0–34.0)
MCHC: 33.1 g/dL (ref 32.0–36.0)
MCV: 83.3 fL (ref 80.0–100.0)
Platelets: 356 10*3/uL (ref 150–440)
RBC: 3.16 MIL/uL — ABNORMAL LOW (ref 3.80–5.20)
RDW: 16.7 % — AB (ref 11.5–14.5)
WBC: 9.1 10*3/uL (ref 3.6–11.0)

## 2017-10-31 LAB — MULTIPLE MYELOMA PANEL, SERUM
Albumin SerPl Elph-Mcnc: 2.7 g/dL — ABNORMAL LOW (ref 2.9–4.4)
Albumin/Glob SerPl: 1.2 (ref 0.7–1.7)
Alpha 1: 0.3 g/dL (ref 0.0–0.4)
Alpha2 Glob SerPl Elph-Mcnc: 0.7 g/dL (ref 0.4–1.0)
B-Globulin SerPl Elph-Mcnc: 0.9 g/dL (ref 0.7–1.3)
Gamma Glob SerPl Elph-Mcnc: 0.5 g/dL (ref 0.4–1.8)
Globulin, Total: 2.4 g/dL (ref 2.2–3.9)
IGA: 132 mg/dL (ref 64–422)
IGM (IMMUNOGLOBULIN M), SRM: 14 mg/dL — AB (ref 26–217)
IgG (Immunoglobin G), Serum: 532 mg/dL — ABNORMAL LOW (ref 700–1600)
Total Protein ELP: 5.1 g/dL — ABNORMAL LOW (ref 6.0–8.5)

## 2017-10-31 LAB — PROCALCITONIN

## 2017-10-31 LAB — LACTIC ACID, PLASMA: Lactic Acid, Venous: 0.8 mmol/L (ref 0.5–1.9)

## 2017-10-31 MED ORDER — PANTOPRAZOLE SODIUM 40 MG PO TBEC
40.0000 mg | DELAYED_RELEASE_TABLET | Freq: Two times a day (BID) | ORAL | Status: DC
  Administered 2017-10-31 – 2017-11-01 (×2): 40 mg via ORAL
  Filled 2017-10-31 (×2): qty 1

## 2017-10-31 MED ORDER — SODIUM CHLORIDE 0.9 % IV SOLN
1.0000 g | INTRAVENOUS | Status: DC
  Administered 2017-10-31 – 2017-11-01 (×2): 1 g via INTRAVENOUS
  Filled 2017-10-31: qty 10
  Filled 2017-10-31 (×2): qty 1

## 2017-10-31 MED ORDER — CEPASTAT 14.5 MG MT LOZG
1.0000 | LOZENGE | OROMUCOSAL | Status: DC | PRN
  Filled 2017-10-31 (×2): qty 9

## 2017-10-31 MED ORDER — MENTHOL 3 MG MT LOZG
1.0000 | LOZENGE | OROMUCOSAL | Status: DC | PRN
  Administered 2017-10-31 – 2017-11-01 (×4): 3 mg via ORAL
  Filled 2017-10-31: qty 9

## 2017-10-31 NOTE — Progress Notes (Signed)
Cephas Darby, MD [address]  [place]  [place], [place] [address]  Main: [phone]  Fax: [phone] Pager: [phone]   Subjective: Denies any complaints. Reports feeling better. She did not have any bowel movement today. Patient underwent EGD yesterday found to have actively bleeding duodenal ulcer that was cauterized   Objective: Vital signs in last 24 hours: Vitals:   10/31/17 0117 10/31/17 0350 10/31/17 1213 10/31/17 1949  BP:  105/67 123/71 113/68  Pulse: (!) 113 (!) 119 (!) 107 (!) 101  Resp:  '20 18 20  '$ Temp:  98.9 F (37.2 C) 99.5 F (37.5 C) 99.3 F (37.4 C)  TempSrc:  Oral Oral Oral  SpO2: 99% 96% 99% 98%  Weight:      Height:       Weight change:   Intake/Output Summary (Last 24 hours) at 10/31/2017 2001 Last data filed at 10/31/2017 1456 Gross per 24 hour  Intake 1540 ml  Output 1100 ml  Net 440 ml     Exam: Heart:: Regular rate and rhythm or S1S2 present Lungs: normal and clear to auscultation Abdomen: soft, nontender, normal bowel sounds   Lab Results: CBC Latest Ref Rng & Units 10/31/2017 10/30/2017 10/29/2017  WBC 3.6 - 11.0 K/uL 9.1 10.4 -  Hemoglobin 12.0 - 16.0 g/dL 8.7(L) 9.1(L) 6.4(L)  Hematocrit 35.0 - 47.0 % 26.3(L) 27.7(L) 20.3(L)  Platelets 150 - 440 K/uL 356 417 -   BMP Latest Ref Rng & Units 10/30/2017 10/29/2017 10/28/2017  Glucose 70 - 99 mg/dL 105(H) 116(H) 153(H)  BUN 8 - 23 mg/dL '9 13 16  '$ Creatinine 0.44 - 1.00 mg/dL 0.67 0.83 0.82  Sodium 135 - 145 mmol/L 144 144 141  Potassium 3.5 - 5.1 mmol/L 4.1 3.3(L) 3.0(L)  Chloride 98 - 111 mmol/L 112(H) 109 105  CO2 22 - 32 mmol/L '27 29 30  '$ Calcium 8.9 - 10.3 mg/dL 8.3(L) 8.1(L) 8.6(L)   Hepatic Function Latest Ref Rng & Units 09/04/2014  Total Protein 6.5 - 8.1 g/dL 7.8  Albumin 3.5 - 5.0 g/dL 4.0  AST 15 - 41 U/L 34  ALT 14 - 54 U/L 23  Alk Phosphatase 38 - 126 U/L 85  Total Bilirubin 0.3 - 1.2 mg/dL 0.6  Bilirubin, Direct 0.1 - 0.5 mg/dL 0.3   Micro  Results: No results found for this or any previous visit (from the past 240 hour(s)). Studies/Results: No results found. Medications: I have reviewed the patient's current medications. Scheduled Meds: . mometasone-formoterol  2 puff Inhalation BID  . pantoprazole  40 mg Oral BID  . potassium chloride  20 mEq Oral BID  . predniSONE  5 mg Oral Daily  . tiotropium  18 mcg Inhalation QPM   Continuous Infusions: . sodium chloride 75 mL/hr at 10/31/17 0726  . cefTRIAXone (ROCEPHIN)  IV Stopped (10/31/17 1150)   PRN Meds:.albuterol, docusate sodium, menthol-cetylpyridinium, ondansetron (ZOFRAN) IV   Assessment: Principal Problem:   Symptomatic anemia Active Problems:   GI bleed  Duodenal ulcer, status post cautery Bleeding stopped Patient has iron and B12 deficiency  Plan: Continue Protonix 40 mg twice daily for 3 months Check H. pylori IgG and treat if positive Recommend oral iron 325 mg twice daily as outpatient for 3 months  Recommend oral B12 1056mg weekly for 4 weeks then once a month for 3 months  Advance diet as tolerated Follow-up with Dr. TAlice Reichert  Dr. AVicente Malesto cover for the weekend    after discharge  LOS: 3 days  Tim Wilhide 10/31/2017, 8:01 PM

## 2017-10-31 NOTE — Care Management Important Message (Signed)
Copy of signed IM left with patient in room.  

## 2017-10-31 NOTE — Progress Notes (Signed)
[facility] [place] NAME: [name] [name]    MR#:  [id number]  DATE OF BIRTH:  1945/12/05  SUBJECTIVE:  CHIEF COMPLAINT: Patient is doing fine.  Received 3 units of blood transfusion.  Dizziness resolved.  Spiked temperature last night Reporting frequent urination  REVIEW OF SYSTEMS:  CONSTITUTIONAL: No fever, fatigue or weakness.  EYES: No blurred or double vision.  EARS, NOSE, AND THROAT: No tinnitus or ear pain.  RESPIRATORY: No cough, shortness of breath, wheezing or hemoptysis.  CARDIOVASCULAR: No chest pain, orthopnea, edema.  GASTROINTESTINAL: No nausea, vomiting, diarrhea or abdominal pain.  GENITOURINARY: No dysuria, hematuria.  ENDOCRINE: Reporting frequent urination denies nocturia,  HEMATOLOGY: No anemia, easy bruising or bleeding SKIN: No rash or lesion. MUSCULOSKELETAL: No joint pain or arthritis.   NEUROLOGIC: No tingling, numbness, weakness.  PSYCHIATRY: No anxiety or depression.   DRUG ALLERGIES:  No Known Allergies  VITALS:  Blood pressure 123/71, pulse (!) 107, temperature 99.5 F (37.5 C), temperature source Oral, resp. rate 18, height '5\' 7"'$  (1.702 m), weight 85.3 kg, SpO2 99 %.  PHYSICAL EXAMINATION:  GENERAL:  72 y.o.-year-old patient lying in the bed with no acute distress.  EYES: Pupils equal, round, reactive to light and accommodation. No scleral icterus. Extraocular muscles intact.  HEENT: Head atraumatic, normocephalic. Oropharynx and nasopharynx clear.  NECK:  Supple, no jugular venous distention. No thyroid enlargement, no tenderness.  LUNGS: Normal breath sounds bilaterally, no wheezing, rales,rhonchi or crepitation. No use of accessory muscles of respiration.  CARDIOVASCULAR: S1, S2 normal. No murmurs, rubs, or gallops.  ABDOMEN: Soft, nontender, nondistended. Bowel sounds present. No organomegaly or mass.  EXTREMITIES: No pedal edema, cyanosis, or clubbing.  NEUROLOGIC: Cranial nerves II through  XII are intact. Muscle strength 5/5 in all extremities. Sensation intact. Gait not checked.  PSYCHIATRIC: The patient is alert and oriented x 3.  SKIN: No obvious rash, lesion, or ulcer.    LABORATORY PANEL:   CBC Recent Labs  Lab 10/31/17 0918  WBC 9.1  HGB 8.7*  HCT 26.3*  PLT 356   ------------------------------------------------------------------------------------------------------------------  Chemistries  Recent Labs  Lab 10/30/17 0002  NA 144  K 4.1  CL 112*  CO2 27  GLUCOSE 105*  BUN 9  CREATININE 0.67  CALCIUM 8.3*   ------------------------------------------------------------------------------------------------------------------  Cardiac Enzymes No results for input(s): TROPONINI in the last 168 hours. ------------------------------------------------------------------------------------------------------------------  RADIOLOGY:  No results found.  EKG:   Orders placed or performed during the hospital encounter of 10/28/17  . EKG 12-Lead  . EKG 12-Lead  . ED EKG  . ED EKG    ASSESSMENT AND PLAN:   *Fever with abnormal urinalysis Will get urine culture and sensitivity and patient is on empiric IV Rocephin Chest x-ray normal and patient is asymptomatic Lactic acid and procalcitonin are normal.  No leukocytosis  *Symptomatic anemia from duodenal ulcer Due to blood loss over her chronic iron deficiency anemia. 3 units of blood transfusion was given and hemoglobin went up from 6.4 to 9.1 --8.7  gentle hydration with IV fluids  Seen by gastroenterology  Patient had EGD and colonoscopy August 15 EGD with duodenal ulcer status post cauterization, esophageal stenosis, colonoscopy with diverticulosis IV Protonix drip changed to p.o. Protonix twice a day Stop using NSAIDs including aspirin as per GI recommendations  *Normocytic anemia from chronic anemia  normal MCV Patient has severe iron deficiency with ferritin at 15 IV iron ordered Outpatient  follow-up with Dr. Rogue Bussing She might need  bone marrow biopsy if no improvement B12 supplements  *COPD Continue inhalers and nebulizer therapy.  *Hypertension We will hold medications for now.   *Hypokalemia start replacement oral.    All the records are reviewed and case discussed with Care Management/Social Workerr. Management plans discussed with the patient, family and they are in agreement.  CODE STATUS: fc TOTAL TIME TAKING CARE OF THIS PATIENT: 36  minutes.   POSSIBLE D/C IN 2  DAYS, DEPENDING ON CLINICAL CONDITION.  Note: This dictation was prepared with Dragon dictation along with smaller phrase technology. Any transcriptional errors that result from this process are unintentional.   Nicholes Mango M.D on 10/31/2017 at 2:28 PM  Between 7am to 6pm - Pager - [phone] After 6pm go to [email or web address] - password [email or web address] [facility]  Office  [phone]  CC: Primary care physician; Tracie Harrier, MD

## 2017-11-01 LAB — CBC
HCT: 26.8 % — ABNORMAL LOW (ref 35.0–47.0)
HEMOGLOBIN: 8.8 g/dL — AB (ref 12.0–16.0)
MCH: 27.7 pg (ref 26.0–34.0)
MCHC: 32.7 g/dL (ref 32.0–36.0)
MCV: 84.6 fL (ref 80.0–100.0)
PLATELETS: 348 10*3/uL (ref 150–440)
RBC: 3.17 MIL/uL — ABNORMAL LOW (ref 3.80–5.20)
RDW: 17.6 % — ABNORMAL HIGH (ref 11.5–14.5)
WBC: 9.6 10*3/uL (ref 3.6–11.0)

## 2017-11-01 LAB — URINE CULTURE: SPECIAL REQUESTS: NORMAL

## 2017-11-01 LAB — PROCALCITONIN: Procalcitonin: <0.1 ng/mL

## 2017-11-01 MED ORDER — FERROUS SULFATE 325 (65 FE) MG PO TABS: 325.0000 mg | ORAL_TABLET | Freq: Two times a day (BID) | ORAL | 3 refills | Status: AC

## 2017-11-01 MED ORDER — PANTOPRAZOLE SODIUM 40 MG PO TBEC: 40.0000 mg | DELAYED_RELEASE_TABLET | Freq: Two times a day (BID) | ORAL | 3 refills | Status: DC

## 2017-11-01 MED ORDER — VITAMIN B-12 1000 MCG PO TABS: 1000.0000 ug | ORAL_TABLET | Freq: Every day | ORAL | 1 refills | Status: AC

## 2017-11-01 MED ORDER — CEPHALEXIN 500 MG PO CAPS: 500.0000 mg | ORAL_CAPSULE | Freq: Two times a day (BID) | ORAL | 0 refills | Status: AC

## 2017-11-01 MED ORDER — ACETAMINOPHEN 325 MG PO TABS
650.0000 mg | ORAL_TABLET | Freq: Four times a day (QID) | ORAL | Status: DC | PRN
  Administered 2017-11-01 (×3): 650 mg via ORAL
  Filled 2017-11-01 (×3): qty 2

## 2017-11-01 NOTE — Progress Notes (Signed)
11/01/2017 4:05 PM  Marcille BuffyLena H Layfield to be D/C'd Home per MD order.  Discussed prescriptions and follow up appointments with the patient. Prescriptions given to patient, medication list explained in detail. Pt verbalized understanding.  Allergies as of 11/01/2017   No Known Allergies     Medication List    STOP taking these medications   aspirin 81 MG EC tablet   furosemide 20 MG tablet Commonly known as:  LASIX     TAKE these medications   albuterol (2.5 MG/3ML) 0.083% nebulizer solution Commonly known as:  PROVENTIL Inhale 3 mLs into the lungs every 6 (six) hours as needed for wheezing.   albuterol 108 (90 Base) MCG/ACT inhaler Commonly known as:  PROVENTIL HFA;VENTOLIN HFA Inhale 2 puffs into the lungs every 6 (six) hours as needed for wheezing or shortness of breath.   cephALEXin 500 MG capsule Commonly known as:  KEFLEX Take 1 capsule (500 mg total) by mouth 2 (two) times daily for 10 days.   cholecalciferol 1000 units tablet Commonly known as:  VITAMIN D Take 1,000 Units by mouth daily with lunch.   docusate sodium 100 MG capsule Commonly known as:  COLACE Take 100 mg by mouth 2 (two) times daily as needed for mild constipation or moderate constipation.   ferrous sulfate 325 (65 FE) MG tablet Take 1 tablet (325 mg total) by mouth 2 (two) times daily with a meal. What changed:  when to take this   Fluticasone-Salmeterol 500-50 MCG/DOSE Aepb Commonly known as:  ADVAIR Inhale 1 puff into the lungs every 12 (twelve) hours.   pantoprazole 40 MG tablet Commonly known as:  PROTONIX Take 1 tablet (40 mg total) by mouth 2 (two) times daily.   predniSONE 5 MG tablet Commonly known as:  DELTASONE Take 5 mg by mouth daily.   tiotropium 18 MCG inhalation capsule Commonly known as:  SPIRIVA Place 1 capsule (18 mcg total) into inhaler and inhale daily. What changed:  when to take this   vitamin B-12 1000 MCG tablet Commonly known as:  CYANOCOBALAMIN Take 1 tablet (1,000  mcg total) by mouth daily.   vitamin C 500 MG tablet Commonly known as:  ASCORBIC ACID Take 500 mg by mouth daily.       Vitals:   10/31/17 1949 11/01/17 0449  BP: 113/68 95/73  Pulse: (!) 101 (!) 108  Resp: 20 20  Temp: 99.3 F (37.4 C) 99 F (37.2 C)  SpO2: 98% 95%    Skin clean, dry and intact without evidence of skin break down, no evidence of skin tears noted. IV catheter discontinued intact. Site without signs and symptoms of complications. Dressing and pressure applied. Pt denies pain at this time. No complaints noted.  An After Visit Summary was printed and given to the patient. Patient escorted via WC, and D/C home via private auto.  Bradly Chrisougherty, Aalayah Riles E

## 2017-11-01 NOTE — Discharge Summary (Signed)
[facility] [place] NAME: [name] [name]    MR#:  [id number]  DATE OF BIRTH:  [birth year]  DATE OF ADMISSION:  10/28/2017 ADMITTING PHYSICIAN: Vaughan Basta, MD  DATE OF DISCHARGE:  11/01/17  PRIMARY CARE PHYSICIAN: Tracie Harrier, MD    ADMISSION DIAGNOSIS:  Dizziness [R42] Gastrointestinal hemorrhage, unspecified gastrointestinal hemorrhage type [K92.2] Anemia, unspecified type [D64.9]  DISCHARGE DIAGNOSIS:  Principal Problem:   Symptomatic anemia Active Problems:   GI bleed UTI  SECONDARY DIAGNOSIS:   Past Medical History:  Diagnosis Date  . COPD (chronic obstructive pulmonary disease) ([place])   . Hypertension     HOSPITAL COURSE:  HISTORY OF PRESENT ILLNESS: [name] [name]  is a 72 y.o. female with a known history of COPD and hypertension, I have chronic anemia but not diagnosed the reason until yet. For last few days feels significantly worse and have dizziness. Called her clinic and went to have an urgent visit there in the morning where noted to have low blood pressures so they advised to go to emergency room right away. Came to emergency room with that and noted to have low blood pressure and some tachycardia with low hemoglobin. After some fluids blood pressure is stable but hemoglobin noted to be 5.5 and guaiac was positive so she was ordered to have blood transfusion and given to hospitalist team for further management. Patient denies any history of peptic ulcer disease and she never had colonoscopy done.  She denies history of colon cancer in the family.  She denies using over-the-counter pain medications or BC or Goody powders.  She takes small dose aspirin every day.   *Fever with abnormal urinalysis Afebrile for 48 hours.  Improved with IV Rocephin Please follow-up on the urine culture and sensitivity Discharge home with p.o. Keflex for 5 days Chest x-ray normal and patient is asymptomatic Lactic acid and  procalcitonin are normal.  No leukocytosis  *Symptomatic anemia from duodenal ulcer Due to blood loss over her chronic iron deficiency anemia. 3 units of blood transfusion was given and hemoglobin went up from 6.4 to 9.1 --8.7 -8.8 gentle hydration with IV fluids provided during the hospital course Seen by gastroenterology  Patient had EGD and colonoscopy August 15 EGD with duodenal ulcer status post cauterization, esophageal stenosis, colonoscopy with diverticulosis IV Protonix drip changed to p.o. Protonix twice a day continue for 3 months Check H. pylori IgG and treat if positive Stop using NSAIDs including aspirin as per GI recommendations Outpatient follow-up with Dr. Alice Reichert in 2 weeks Okay to discharge patient from GI standpoint  *Normocytic anemia from chronic anemia  normal MCV Patient has severe iron deficiency with ferritin at 15 IV iron ordered during the hospital course Iron supplements 325 mg twice a day for 3 months and stool softeners Outpatient follow-up with Dr. Rogue Bussing She might need bone marrow biopsy if no improvement B12 supplements-   *COPD Continue inhalers and nebulizer therapy. Continue home dose prednisone  *Hypertension We will hold medications for now until seen by primary care physician  *Hypokalemia repleted  Patient is ambulating fine, back to her baseline discharge home  DISCHARGE CONDITIONS:  STABLE  CONSULTS OBTAINED:  Treatment Team:  Cammie Sickle, MD   PROCEDURES   EGD with duodenal ulcer status post cauterization, esophageal stenosis, colonoscopy with diverticulosis   DRUG ALLERGIES:  No Known Allergies  DISCHARGE MEDICATIONS:   Allergies as of 11/01/2017   No Known Allergies     Medication List  STOP taking these medications   aspirin 81 MG EC tablet   furosemide 20 MG tablet Commonly known as:  LASIX     TAKE these medications   albuterol (2.5 MG/3ML) 0.083% nebulizer solution Commonly known as:   PROVENTIL Inhale 3 mLs into the lungs every 6 (six) hours as needed for wheezing.   albuterol 108 (90 Base) MCG/ACT inhaler Commonly known as:  PROVENTIL HFA;VENTOLIN HFA Inhale 2 puffs into the lungs every 6 (six) hours as needed for wheezing or shortness of breath.   cephALEXin 500 MG capsule Commonly known as:  KEFLEX Take 1 capsule (500 mg total) by mouth 2 (two) times daily for 10 days.   cholecalciferol 1000 units tablet Commonly known as:  VITAMIN D Take 1,000 Units by mouth daily with lunch.   docusate sodium 100 MG capsule Commonly known as:  COLACE Take 100 mg by mouth 2 (two) times daily as needed for mild constipation or moderate constipation.   ferrous sulfate 325 (65 FE) MG tablet Take 1 tablet (325 mg total) by mouth 2 (two) times daily with a meal. What changed:  when to take this   Fluticasone-Salmeterol 500-50 MCG/DOSE Aepb Commonly known as:  ADVAIR Inhale 1 puff into the lungs every 12 (twelve) hours.   pantoprazole 40 MG tablet Commonly known as:  PROTONIX Take 1 tablet (40 mg total) by mouth 2 (two) times daily.   predniSONE 5 MG tablet Commonly known as:  DELTASONE Take 5 mg by mouth daily.   tiotropium 18 MCG inhalation capsule Commonly known as:  SPIRIVA Place 1 capsule (18 mcg total) into inhaler and inhale daily. What changed:  when to take this   vitamin B-12 1000 MCG tablet Commonly known as:  CYANOCOBALAMIN Take 1 tablet (1,000 mcg total) by mouth daily.   vitamin C 500 MG tablet Commonly known as:  ASCORBIC ACID Take 500 mg by mouth daily.        DISCHARGE INSTRUCTIONS:  Follow-up with primary care physician in 3 days, please follow-up on the urine culture and sensitivities pending Follow-up with gastroenterology Dr. Alice Reichert in 3 to 4 weeks Follow-up with hematology Dr. Rogue Bussing in 2 weeks Stop taking NSAIDs and aspirin  DIET:  LOW SALT DIET  DISCHARGE CONDITION:  Stable  ACTIVITY:  Activity as tolerated  OXYGEN:   Home Oxygen: Yes.     Oxygen Delivery: 2 liters/min via Patient connected to nasal cannula oxygen  DISCHARGE LOCATION:  home   If you experience worsening of your admission symptoms, develop shortness of breath, life threatening emergency, suicidal or homicidal thoughts you must seek medical attention immediately by calling 911 or calling your MD immediately  if symptoms less severe.  You Must read complete instructions/literature along with all the possible adverse reactions/side effects for all the Medicines you take and that have been prescribed to you. Take any new Medicines after you have completely understood and accpet all the possible adverse reactions/side effects.   Please note  You were cared for by a hospitalist during your hospital stay. If you have any questions about your discharge medications or the care you received while you were in the hospital after you are discharged, you can call the unit and asked to speak with the hospitalist on call if the hospitalist that took care of you is not available. Once you are discharged, your primary care physician will handle any further medical issues. Please note that NO REFILLS for any discharge medications will be authorized once you are discharged,  as it is imperative that you return to your primary care physician (or establish a relationship with a primary care physician if you do not have one) for your aftercare needs so that they can reassess your need for medications and monitor your lab values.     Today  Chief Complaint  Patient presents with  . Dizziness    orthostatic    Patient is doing much better.  Dizziness completely resolved.  She is at her baseline.  Discharge home  ROS:  CONSTITUTIONAL: Denies fevers, chills. Denies any fatigue, weakness.  EYES: Denies blurry vision, double vision, eye pain. EARS, NOSE, THROAT: Denies tinnitus, ear pain, hearing loss. RESPIRATORY: Denies cough, wheeze, shortness of breath.   CARDIOVASCULAR: Denies chest pain, palpitations, edema.  GASTROINTESTINAL: Denies nausea, vomiting, diarrhea, abdominal pain. Denies bright red blood per rectum. GENITOURINARY: Denies dysuria, hematuria. ENDOCRINE: Denies nocturia or thyroid problems. HEMATOLOGIC AND LYMPHATIC: Denies easy bruising or bleeding. SKIN: Denies rash or lesion. MUSCULOSKELETAL: Denies pain in neck, back, shoulder, knees, hips or arthritic symptoms.  NEUROLOGIC: Denies paralysis, paresthesias.  PSYCHIATRIC: Denies anxiety or depressive symptoms.   VITAL SIGNS:  Blood pressure 95/73, pulse (!) 108, temperature 99 F (37.2 C), temperature source Oral, resp. rate 20, height '5\' 7"'$  (1.702 m), weight 85.3 kg, SpO2 95 %.  I/O:    Intake/Output Summary (Last 24 hours) at 11/01/2017 1259 Last data filed at 11/01/2017 1013 Gross per 24 hour  Intake 719 ml  Output 2150 ml  Net -1431 ml    PHYSICAL EXAMINATION:  GENERAL:  72 y.o.-year-old patient lying in the bed with no acute distress.  EYES: Pupils equal, round, reactive to light and accommodation. No scleral icterus. Extraocular muscles intact.  HEENT: Head atraumatic, normocephalic. Oropharynx and nasopharynx clear.  NECK:  Supple, no jugular venous distention. No thyroid enlargement, no tenderness.  LUNGS: Normal breath sounds bilaterally, no wheezing, rales,rhonchi or crepitation. No use of accessory muscles of respiration.  CARDIOVASCULAR: S1, S2 normal. No murmurs, rubs, or gallops.  ABDOMEN: Soft, non-tender, non-distended. Bowel sounds present. No organomegaly or mass.  EXTREMITIES: No pedal edema, cyanosis, or clubbing.  NEUROLOGIC: Cranial nerves II through XII are intact. Muscle strength 5/5 in all extremities. Sensation intact. Gait not checked.  PSYCHIATRIC: The patient is alert and oriented x 3.  SKIN: No obvious rash, lesion, or ulcer.   DATA REVIEW:   CBC Recent Labs  Lab 11/01/17 0503  WBC 9.6  HGB 8.8*  HCT 26.8*  PLT 348     Chemistries  Recent Labs  Lab 10/30/17 0002  NA 144  K 4.1  CL 112*  CO2 27  GLUCOSE 105*  BUN 9  CREATININE 0.67  CALCIUM 8.3*    Cardiac Enzymes No results for input(s): TROPONINI in the last 168 hours.  Microbiology Results  Results for orders placed or performed during the hospital encounter of 10/28/17  Urine Culture     Status: Abnormal   Collection Time: 10/31/17 11:33 AM  Result Value Ref Range Status   Specimen Description   Final    URINE, CLEAN CATCH Performed at [facility], [address] [place], [place] [address]    Special Requests   Final    Normal Performed at [facility], [address] [place], [place] [address]    Culture MULTIPLE SPECIES PRESENT, SUGGEST RECOLLECTION (A)  Final   Report Status 11/01/2017 FINAL  Final    RADIOLOGY:  Dg Chest 2 View  Result Date: 10/28/2017 CLINICAL DATA:  Short of breath EXAM: CHEST - 2 VIEW COMPARISON:  09/05/2014 FINDINGS: Linear scarring or atelectasis at the right base. No acute consolidation or pleural effusion. Borderline cardiomegaly. Aortic atherosclerosis. No pneumothorax. IMPRESSION: No active cardiopulmonary disease. Borderline cardiomegaly. Linear atelectasis or scarring at the right base. Electronically Signed   By: Donavan Foil M.D.   On: 10/28/2017 17:05    EKG:   Orders placed or performed during the hospital encounter of 10/28/17  . EKG 12-Lead  . EKG 12-Lead  . ED EKG  . ED EKG      Management plans discussed with the patient, family and they are in agreement.  CODE STATUS:     Code Status Orders  (From admission, onward)         Start     Ordered   10/28/17 2148  Full code  Continuous     10/28/17 2147        Code Status History    Date Active Date Inactive Code Status Order ID Comments User Context   09/04/2014 1427 09/12/2014 1833 Full Code [id number]  Aldean Jewett, MD Inpatient    Advance Directive Documentation     Most Recent Value   Type of Advance Directive  Healthcare Power of Attorney  Pre-existing out of facility DNR order (yellow form or pink MOST form)  -  "MOST" Form in Place?  -      TOTAL TIME TAKING CARE OF THIS PATIENT: 43 minutes.   Note: This dictation was prepared with Dragon dictation along with smaller phrase technology. Any transcriptional errors that result from this process are unintentional.   '@M[facility]'$ @  on 11/01/2017 at 12:59 PM  Between 7am to 6pm - Pager - [phone]  After 6pm go to [email or web address] - password [email or web address] [facility]  Office  [phone]  CC: Primary care physician; Tracie Harrier, MD

## 2017-11-01 NOTE — Discharge Instructions (Signed)
Follow-up with primary care physician in 3 days.  Please follow-up on the urine culture and sensitive which is pending Follow-up with gastroenterology Dr. Norma Fredricksonoledo in 3 to 4 weeks Follow-up with hematology Dr. Donneta RombergBrahmanday in 2 weeks Stop taking NSAIDs and aspirin

## 2017-11-03 ENCOUNTER — Encounter: Payer: Self-pay | Admitting: Internal Medicine

## 2017-11-03 LAB — SURGICAL PATHOLOGY

## 2018-05-27 ENCOUNTER — Emergency Department
Admission: EM | Admit: 2018-05-27 | Discharge: 2018-05-27 | Disposition: A | Discharge status: Home / Self Care | Payer: Medicare Other | Attending: Emergency Medicine | Admitting: Emergency Medicine

## 2018-05-27 ENCOUNTER — Other Ambulatory Visit: Payer: Self-pay

## 2018-05-27 ENCOUNTER — Encounter: Payer: Self-pay | Admitting: Emergency Medicine

## 2018-05-27 ENCOUNTER — Emergency Department: Payer: Medicare Other

## 2018-05-27 DIAGNOSIS — Z79899 Other long term (current) drug therapy: Secondary | ICD-10-CM | POA: Diagnosis not present

## 2018-05-27 DIAGNOSIS — R0602 Shortness of breath: Secondary | ICD-10-CM | POA: Diagnosis present

## 2018-05-27 DIAGNOSIS — I10 Essential (primary) hypertension: Secondary | ICD-10-CM | POA: Diagnosis not present

## 2018-05-27 DIAGNOSIS — Z87891 Personal history of nicotine dependence: Secondary | ICD-10-CM | POA: Diagnosis not present

## 2018-05-27 DIAGNOSIS — J441 Chronic obstructive pulmonary disease with (acute) exacerbation: Secondary | ICD-10-CM | POA: Diagnosis not present

## 2018-05-27 LAB — CBC WITH DIFFERENTIAL/PLATELET
Abs Immature Granulocytes: 0.03 10*3/uL (ref 0.00–0.07)
Basophils Absolute: 0 10*3/uL (ref 0.0–0.1)
Basophils Relative: 0 %
Eosinophils Absolute: 0.2 10*3/uL (ref 0.0–0.5)
Eosinophils Relative: 3 %
HCT: 40.4 % (ref 36.0–46.0)
HEMOGLOBIN: 12.8 g/dL (ref 12.0–15.0)
Immature Granulocytes: 0 %
LYMPHS PCT: 11 %
Lymphs Abs: 0.9 10*3/uL (ref 0.7–4.0)
MCH: 29.8 pg (ref 26.0–34.0)
MCHC: 31.7 g/dL (ref 30.0–36.0)
MCV: 94 fL (ref 80.0–100.0)
Monocytes Absolute: 0.4 10*3/uL (ref 0.1–1.0)
Monocytes Relative: 6 %
Neutro Abs: 6.3 10*3/uL (ref 1.7–7.7)
Neutrophils Relative %: 80 %
Platelets: 280 10*3/uL (ref 150–400)
RBC: 4.3 MIL/uL (ref 3.87–5.11)
RDW: 14.2 % (ref 11.5–15.5)
WBC: 7.8 10*3/uL (ref 4.0–10.5)
nRBC: 0 % (ref 0.0–0.2)

## 2018-05-27 LAB — BASIC METABOLIC PANEL
Anion gap: 10 (ref 5–15)
BUN: 12 mg/dL (ref 8–23)
CO2: 28 mmol/L (ref 22–32)
Calcium: 9.2 mg/dL (ref 8.9–10.3)
Chloride: 105 mmol/L (ref 98–111)
Creatinine, Ser: 0.6 mg/dL (ref 0.44–1.00)
GFR calc Af Amer: >60 mL/min (ref >60)
GFR calc non Af Amer: >60 mL/min (ref >60)
Glucose, Bld: 135 mg/dL — ABNORMAL HIGH (ref 70–99)
Potassium: 3.6 mmol/L (ref 3.5–5.1)
Sodium: 143 mmol/L (ref 135–145)

## 2018-05-27 LAB — TROPONIN I

## 2018-05-27 MED ORDER — PREDNISONE 20 MG PO TABS: 40.0000 mg | ORAL_TABLET | Freq: Every day | ORAL | 0 refills | Status: DC

## 2018-05-27 MED ORDER — IPRATROPIUM-ALBUTEROL 0.5-2.5 (3) MG/3ML IN SOLN
3.0000 mL | Freq: Once | RESPIRATORY_TRACT | Status: AC
  Administered 2018-05-27: 3 mL via RESPIRATORY_TRACT
  Filled 2018-05-27: qty 3

## 2018-05-27 MED ORDER — METHYLPREDNISOLONE SODIUM SUCC 125 MG IJ SOLR
125.0000 mg | Freq: Once | INTRAMUSCULAR | Status: AC
  Administered 2018-05-27: 125 mg via INTRAVENOUS
  Filled 2018-05-27: qty 2

## 2018-05-27 NOTE — Discharge Instructions (Addendum)
Please seek medical attention for any high fevers, chest pain, shortness of breath, change in behavior, persistent vomiting, bloody stool or any other new or concerning symptoms.  

## 2018-05-27 NOTE — ED Notes (Signed)
Pt was able to ambulate in the room and hallway without experiencing shortness of breath. We will continue to monitor the pt.

## 2018-05-27 NOTE — ED Triage Notes (Signed)
Pt to ED via POV c/o shortness of breath off and on x 1 week, worse last night. Pt states that her ShOB is worse when she tries to get up and move around. Pt has hx/o COPD, pt is on SpO2 at home, 2 liter via [place]. Pt is tachycardic in triage.

## 2018-05-27 NOTE — ED Provider Notes (Signed)
[facility] Emergency Department Provider Note   ____________________________________________   I have reviewed the triage vital signs and the nursing notes.   HISTORY  Chief Complaint Shortness of Breath   History limited by: Not Limited   HPI [name] [name] is a 73 y.o. female who presents to the emergency department today because of concerns for shortness of breath.  Patient states she started losing her breath last night.  It is continued this morning.  She notices it when she gets up or tries to walk around or exert herself.  When she is sitting down she feels okay.  Patient has a history of COPD is on 2 L at baseline.  Has been using her home breathing treatments without any significant relief this morning.  She denies any associated chest pain.  She denies any associated fever.  She has had some cough productive of phlegm.  No lower extremity edema.   Per medical record review patient has a history of COPD, HTN.   Past Medical History:  Diagnosis Date  . COPD (chronic obstructive pulmonary disease) (HCC)   . Hypertension     Patient Active Problem List   Diagnosis Date Noted  . Symptomatic anemia 10/28/2017  . GI bleed 10/28/2017  . COPD exacerbation (HCC) 09/04/2014  . Acute respiratory failure with hypercapnia (HCC) 09/04/2014    Past Surgical History:  Procedure Laterality Date  . ABDOMINAL HYSTERECTOMY    . COLONOSCOPY N/A 10/30/2017   Procedure: COLONOSCOPY;  Surgeon: Toledo, Boykin Nearing, MD;  Location: [facility] ENDOSCOPY;  Service: Gastroenterology;  Laterality: N/A;  . ESOPHAGOGASTRODUODENOSCOPY N/A 10/30/2017   Procedure: ESOPHAGOGASTRODUODENOSCOPY (EGD);  Surgeon: Toledo, Boykin Nearing, MD;  Location: [facility] ENDOSCOPY;  Service: Gastroenterology;  Laterality: N/A;    Prior to Admission medications   Medication Sig Start Date End Date Taking? Authorizing Provider  albuterol (PROVENTIL HFA;VENTOLIN HFA) 108 (90 Base) MCG/ACT inhaler Inhale 2 puffs  into the lungs every 6 (six) hours as needed for wheezing or shortness of breath.    [provider]  albuterol (PROVENTIL) (2.5 MG/3ML) 0.083% nebulizer solution Inhale 3 mLs into the lungs every 6 (six) hours as needed for wheezing.     [provider]  cholecalciferol (VITAMIN D) 1000 units tablet Take 1,000 Units by mouth daily with lunch.    [provider]  docusate sodium (COLACE) 100 MG capsule Take 100 mg by mouth 2 (two) times daily as needed for mild constipation or moderate constipation.     [provider]  ferrous sulfate 325 (65 FE) MG tablet Take 1 tablet (325 mg total) by mouth 2 (two) times daily with a meal. 11/01/17   Gouru, Aruna, MD  Fluticasone-Salmeterol (ADVAIR) 500-50 MCG/DOSE AEPB Inhale 1 puff into the lungs every 12 (twelve) hours.    [provider]  pantoprazole (PROTONIX) 40 MG tablet Take 1 tablet (40 mg total) by mouth 2 (two) times daily. 11/01/17   Gouru, Deanna Artis, MD  predniSONE (DELTASONE) 5 MG tablet Take 5 mg by mouth daily. 08/19/14   [provider]  tiotropium (SPIRIVA) 18 MCG inhalation capsule Place 1 capsule (18 mcg total) into inhaler and inhale daily. Patient taking differently: Place 18 mcg into inhaler and inhale daily with lunch.  09/12/14   Barbette Reichmann, MD  vitamin B-12 (CYANOCOBALAMIN) 1000 MCG tablet Take 1 tablet (1,000 mcg total) by mouth daily. 11/01/17   Ramonita Lab, MD  vitamin C (ASCORBIC ACID) 500 MG tablet Take 500 mg by mouth daily.  [provider]    Allergies Patient has no known allergies.  Family History  Problem Relation Age of Onset  . Hypertension Mother   . Melanoma Father     Social History Social History   Tobacco Use  . Smoking status: Former Games developer  . Smokeless tobacco: Never Used  Substance Use Topics  . Alcohol use: No  . Drug use: Never    Review of Systems Constitutional: No fever/chills Eyes: No visual changes. ENT: No sore  throat. Cardiovascular: Denies chest pain. Respiratory: Positive for shortness of breath. Gastrointestinal: No abdominal pain.  No nausea, no vomiting.  No diarrhea.   Genitourinary: Negative for dysuria. Musculoskeletal: Negative for back pain. Skin: Negative for rash. Neurological: Negative for headaches, focal weakness or numbness.  ____________________________________________   PHYSICAL EXAM:  VITAL SIGNS: ED Triage Vitals  Enc Vitals Group     BP 05/27/18 1012 (!) 149/85     Pulse Rate 05/27/18 1012 (!) 123     Resp 05/27/18 1012 16     Temp 05/27/18 1012 99.1 F (37.3 C)     Temp Source 05/27/18 1012 Oral     SpO2 05/27/18 1012 96 %     Weight 05/27/18 1014 190 lb (86.2 kg)     Height 05/27/18 1014  (1.702 m)     Head Circumference --      Peak Flow --      Pain Score 05/27/18 1014 0   Constitutional: Alert and oriented.  Eyes: Conjunctivae are normal.  ENT      Head: Normocephalic and atraumatic.      Nose: No congestion/rhinnorhea.      Mouth/Throat: Mucous membranes are moist.      Neck: No stridor. Hematological/Lymphatic/Immunilogical: No cervical lymphadenopathy. Cardiovascular: Tachycardic, regular rhythm.  No murmurs, rubs, or gallops. Respiratory: Normal respiratory effort without tachypnea nor retractions. Breath sounds are clear and equal bilaterally. No wheezes/rales/rhonchi. Gastrointestinal: Soft and non tender. No rebound. No guarding.  Genitourinary: Deferred Musculoskeletal: Normal range of motion in all extremities. No lower extremity edema. Neurologic:  Normal speech and language. No gross focal neurologic deficits are appreciated.  Skin:  Skin is warm, dry and intact. No rash noted. Psychiatric: Mood and affect are normal. Speech and behavior are normal. Patient exhibits appropriate insight and judgment.  ____________________________________________    LABS (pertinent positives/negatives)  BMP wnl except glu 135 Trop <0.03 CBC wbc  7.8, hgb 12.8, plt 280   ____________________________________________   EKG  I, Phineas Semen, attending physician, personally viewed and interpreted this EKG  EKG Time: 1038 Rate: 122 Rhythm: sinus tachycardia Axis: normal Intervals: qtc 441 QRS: narrow ST changes: no st elevation Impression: abnormal ekg  ____________________________________________    RADIOLOGY  CXR COPD, left lower lobe atelectasis.   ____________________________________________   PROCEDURES  Procedures  ____________________________________________   INITIAL IMPRESSION / ASSESSMENT AND PLAN / ED COURSE  Pertinent labs & imaging results that were available during my care of the patient were reviewed by me and considered in my medical decision making (see chart for details).   Patient presented to the emergency department today because of concern for shortness of breath. differential would include ACS, PNA, COPD, CHF amongst other etiologies. CXR without concerning acute findings. Trop negative. Patient did feel significant improvement after steroids and treatment. Was able to ambulate without any significant shortness of breath. The patient did feel comfortable going home. Discussed with patient return precautions. At this point doubt PE. Patient states that her heart rate  always goes up when she goes to the doctors, has not had any swelling, chest pain or hemoptysis. We did discuss these as return precautions. Will give patient prescription for steroids.   ____________________________________________   FINAL CLINICAL IMPRESSION(S) / ED DIAGNOSES  Final diagnoses:  COPD exacerbation (HCC)     Note: This dictation was prepared with Dragon dictation. Any transcriptional errors that result from this process are unintentional     Phineas Semen, MD 05/27/18 [phone]

## 2018-05-27 NOTE — ED Notes (Signed)
Pt is being discharged to home. Aox4, VSS, pt does not c/o any pain at this time. AVS & RX was given and explained to the pt and she verbalized understanding of all information.    

## 2019-07-19 ENCOUNTER — Other Ambulatory Visit: Payer: Self-pay

## 2019-07-19 ENCOUNTER — Encounter: Payer: Medicare Other | Attending: Physician Assistant | Admitting: Physician Assistant

## 2019-07-19 DIAGNOSIS — Z87891 Personal history of nicotine dependence: Secondary | ICD-10-CM | POA: Insufficient documentation

## 2019-07-19 DIAGNOSIS — I872 Venous insufficiency (chronic) (peripheral): Secondary | ICD-10-CM | POA: Insufficient documentation

## 2019-07-19 DIAGNOSIS — J449 Chronic obstructive pulmonary disease, unspecified: Secondary | ICD-10-CM | POA: Insufficient documentation

## 2019-07-19 DIAGNOSIS — L97822 Non-pressure chronic ulcer of other part of left lower leg with fat layer exposed: Secondary | ICD-10-CM | POA: Diagnosis not present

## 2019-07-19 DIAGNOSIS — Z9981 Dependence on supplemental oxygen: Secondary | ICD-10-CM | POA: Diagnosis not present

## 2019-07-19 DIAGNOSIS — I10 Essential (primary) hypertension: Secondary | ICD-10-CM | POA: Insufficient documentation

## 2019-07-19 DIAGNOSIS — I890 Lymphedema, not elsewhere classified: Secondary | ICD-10-CM | POA: Insufficient documentation

## 2019-07-19 NOTE — Progress Notes (Signed)
DENYLA, CORTESE ([id number]) Visit Report for 07/19/2019 Allergy List Details Patient Name: [name] [name], [name] [name]. Date of Service: 07/19/2019 2:15 PM Medical Record Number: [id number] Patient Account Number: [id number] Date of Birth/Sex: 17-Jan-1946 (74 y.o. F) Treating RN: [name] [name] Primary Care [name] [name]: [name] [name] Other Clinician: Referring [name] [name]: [name] [name] Treating [name] [name]/Extender: [name] [name] Weeks in Treatment: 0 Allergies Active Allergies No Known Drug Allergies Allergy Notes Electronic Signature(s) Signed: 07/19/2019 4:26:56 PM By: [name] [name] Entered By: [name] [name] on 07/19/2019 14:20:59 [name] [name] ([id number]) -------------------------------------------------------------------------------- Arrival Information Details Patient Name: [name] [name]. Date of Service: 07/19/2019 2:15 PM Medical Record Number: [id number] Patient Account Number: [id number] Date of Birth/Sex: [birth year] (74 y.o. F) Treating RN: [name] [name] Primary Care [name] [name]: [name] [name] Other Clinician: Referring Haynes Giannotti: [name] [name] Treating Marlise Fahr/Extender: [name] [name], [name] Weeks in Treatment: 0 Visit Information Patient Arrived: Ambulatory Arrival Time: 14:13 Accompanied By: self Transfer Assistance: None Patient Identification Verified: Yes Secondary Verification Process Completed: Yes Electronic Signature(s) Signed: 07/19/2019 3:23:18 PM By: [name] [name] RCP, RRT, CHT Entered By: [name] [name] on 07/19/2019 14:13:48 [name] [name] ([id number]) -------------------------------------------------------------------------------- Clinic Level of Care Assessment Details Patient Name: [name] [name]. Date of Service: 07/19/2019 2:15 PM Medical Record Number: [id number] Patient Account Number: [id number] Date of Birth/Sex: 22-Oct-1945 (74 y.o. F) Treating RN: [name] [name] Primary Care [name] [name]: [name] [name] Other Clinician: Referring  [name] [name]: [name] [name] Treating Sharif Rendell/Extender: [name] [name], [name] Weeks in Treatment: 0 Clinic Level of Care Assessment Items TOOL 4 Quantity Score []  - Use when only an EandM is performed on FOLLOW-UP visit 0 ASSESSMENTS - Nursing Assessment / Reassessment X - Reassessment of Co-morbidities (includes updates in patient status) 1 10 X- 1 5 Reassessment of Adherence to Treatment Plan ASSESSMENTS - Wound and Skin Assessment / Reassessment X - Simple Wound Assessment / Reassessment - one wound 1 5 []  - 0 Complex Wound Assessment / Reassessment - multiple wounds []  - 0 Dermatologic / Skin Assessment (not related to wound area) ASSESSMENTS - Focused Assessment X - Circumferential Edema Measurements - multi extremities 1 5 []  - 0 Nutritional Assessment / Counseling / Intervention []  - 0 Lower Extremity Assessment (monofilament, tuning fork, pulses) []  - 0 Peripheral Arterial Disease Assessment (using hand held doppler) ASSESSMENTS - Ostomy and/or Continence Assessment and Care []  - Incontinence Assessment and Management 0 []  - 0 Ostomy Care Assessment and Management (repouching, etc.) PROCESS - Coordination of Care X - Simple Patient / Family Education for ongoing care 1 15 []  - 0 Complex (extensive) Patient / Family Education for ongoing care []  - 0 Staff obtains , Records, Test Results / Process Orders []  - 0 Staff telephones HHA, Nursing Homes / Clarify orders / etc []  - 0 Routine Transfer to another Facility (non-emergent condition) []  - 0 Routine Hospital Admission (non-emergent condition) X- 1 15 New Admissions / / Ordering NPWT, Apligraf, etc. []  - 0 Emergency Hospital Admission (emergent condition) X- 1 10 Simple Discharge Coordination []  - 0 Complex (extensive) Discharge Coordination PROCESS - Special Needs []  - Pediatric / Minor Patient Management 0 []  - 0 Isolation Patient Management []  - 0 Hearing / Language / Visual  special needs []  - 0 Assessment of Community assistance (transportation, D/C planning, etc.) []  - 0 Additional assistance / Altered mentation []  - 0 Support Surface(s) Assessment (bed, cushion, seat, etc.) INTERVENTIONS - Wound Cleansing / Measurement [name], [name] ( ) X- 1 5 Simple Wound Cleansing - one wound []  - 0 Complex Wound  Cleansing - multiple wounds X- 1 5 Wound Imaging (photographs - any number of wounds) []  - 0 Wound Tracing (instead of photographs) X- 1 5 Simple Wound Measurement - one wound []  - 0 Complex Wound Measurement - multiple wounds INTERVENTIONS - Wound Dressings []  - Small Wound Dressing one or multiple wounds 0 X- 1 15 Medium Wound Dressing one or multiple wounds []  - 0 Large Wound Dressing one or multiple wounds []  - 0 Application of Medications - topical []  - 0 Application of Medications - injection INTERVENTIONS - Miscellaneous []  - External ear exam 0 []  - 0 Specimen Collection (cultures, biopsies, blood, body fluids, etc.) []  - 0 Specimen(s) / Culture(s) sent or taken to Lab for analysis []  - 0 Patient Transfer (multiple staff / / Similar devices) []  - 0 Simple Staple / Suture removal (25 or less) []  - 0 Complex Staple / Suture removal (26 or more) []  - 0 Hypo / Hyperglycemic Management (close monitor of Blood Glucose) []  - 0 Ankle / Brachial Index (ABI) - do not check if billed separately X- 1 5 Vital Signs Has the patient been seen at the hospital within the last three years: Yes Total Score: 100 Level Of Care: New/Established - Level 3 Electronic Signature(s) Signed: 07/19/2019 4:19:51 PM By: Entered By: on 07/19/2019 15:03:43 [name], [name] ( ) -------------------------------------------------------------------------------- Lower Extremity Assessment Details Patient Name: . Date of Service: 07/19/2019 2:15 PM Medical Record Number: Patient Account  Number: Nurse, adult Date of Birth/Sex: [birth year] (74 y.o. F) Treating RN: Primary Care Lankford Gutzmer: Other Clinician: Referring Henrry Feil: 09/18/2019 Treating Libbi Towner/Extender: [name] [name], [name] Weeks in Treatment: 0 Edema Assessment Assessed: [Left: No] [Right: No] Edema: [Left: Yes] [Right: Yes] Calf Left: Right: Point of Measurement: 34 cm From Medial Instep 40.5 cm 40 cm Ankle Left: Right: Point of Measurement: 10 cm From Medial Instep 25.5 cm 24.5 cm Vascular Assessment Pulses: Dorsalis Pedis Palpable: [Left:Yes] [Right:Yes] Doppler Audible: [Left:Yes] [Right:Yes] Posterior Tibial Palpable: [Left:Yes] [Right:Yes] Doppler Audible: [Left:Yes] [Right:Yes] Blood Pressure: Brachial: [Left:156] Dorsalis Pedis: 168 [Left:Dorsalis Pedis: 162] Ankle: Posterior Tibial: 174 [Left:Posterior Tibial: 170 1.12] [Right:1.09] Electronic Signature(s) Signed: 07/19/2019 4:26:56 PM By: 09/18/2019 Entered By: Rexene [name] on 07/19/2019 14:37:37 [name], [name] [name] [name] (09/18/2019) -------------------------------------------------------------------------------- Multi Wound Chart Details Patient Name: [id number]. Date of Service: 07/19/2019 2:15 PM Medical Record Number: 04/06/1945 Patient Account Number: (66 Date of Birth/Sex: 09-16-1945 (74 y.o. F) Treating RN: [name] [name] Primary Care Elaine Roanhorse: [name] [name] Other Clinician: Referring Cleopha Indelicato: 09/18/2019 Treating Gavriella Hearst/Extender: [name] [name], [name] Weeks in Treatment: 0 Vital Signs Height(in): 67 Pulse(bpm): 112 Weight(lbs): 194 Blood Pressure(mmHg): 156/94 Body Mass Index(BMI): 30 Temperature(F): 99.4 Respiratory Rate(breaths/min): 18 Photos: [N/A:N/A] Wound Location: Left, Medial Lower Leg N/A N/A Wounding Event: Gradually Appeared N/A N/A Primary Etiology: Venous Leg Ulcer N/A N/A Comorbid History: Chronic Obstructive Pulmonary N/A N/A Disease (COPD), Hypertension Date  Acquired: 06/17/2019 N/A N/A Weeks of Treatment: 0 N/A N/A Wound Status: Open N/A N/A Measurements L x W x D (cm) 5x4.5x0.1 N/A N/A Area (cm) : 17.671 N/A N/A Volume (cm) : 1.767 N/A N/A Classification: Full Thickness Without Exposed N/A N/A Support Structures Exudate Amount: Medium N/A N/A Exudate Type: Serous N/A N/A Exudate Color: amber N/A N/A Wound Margin: Flat and Intact N/A N/A Granulation Amount: Large (67-100%) N/A N/A Granulation Quality: Pink N/A N/A Necrotic Amount: Small (1-33%) N/A N/A Exposed Structures: Fat Layer (Subcutaneous Tissue) N/A N/A Exposed: Yes Fascia: No  Tendon: No Muscle: No Joint: No Bone: No Epithelialization: Medium (34-66%) N/A N/A Treatment Notes Electronic Signature(s) Signed: 07/19/2019 4:19:51 PM By: [name] [name] Entered By: [name] [name] on 07/19/2019 14:59:11 [name] [name] ([id number]) -------------------------------------------------------------------------------- Multi-Disciplinary Care Plan Details Patient Name: [name] [name]. Date of Service: 07/19/2019 2:15 PM Medical Record Number: [id number] Patient Account Number: [id number] Date of Birth/Sex: 1945-08-02 (74 y.o. F) Treating RN: [name] [name] Primary Care Skie Vitrano: [name] [name] Other Clinician: Referring Eldrick Penick: [name] [name] Treating Kambri Dismore/Extender: [name] [name], [name] Weeks in Treatment: 0 Active Inactive Orientation to the Wound Care Program Nursing Diagnoses: Knowledge deficit related to the wound healing center program Goals: Patient/caregiver will verbalize understanding of the Wound Healing Center Program Date Initiated: 07/19/2019 Target Resolution Date: 08/20/2019 Goal Status: Active Interventions: Provide education on orientation to the wound center Notes: Wound/Skin Impairment Nursing Diagnoses: Impaired tissue integrity Goals: Ulcer/skin breakdown will have a volume reduction of 30% by week 4 Date Initiated: 07/19/2019 Target Resolution Date:  08/19/2019 Goal Status: Active Interventions: Assess ulceration(s) every visit Notes: Electronic Signature(s) Signed: 07/19/2019 4:19:51 PM By: [name] [name] Entered By: [name] [name] on 07/19/2019 14:58:57 [name], [name] ([id number]) -------------------------------------------------------------------------------- Pain Assessment Details Patient Name: [name] [name]. Date of Service: 07/19/2019 2:15 PM Medical Record Number: [id number] Patient Account Number: [id number] Date of Birth/Sex: 1945-06-14 (74 y.o. F) Treating RN: [name] [name] Primary Care Jarome Trull: [name] [name] Other Clinician: Referring Montrelle Eddings: [name] [name] Treating Senna Lape/Extender: [name] [name], [name] Weeks in Treatment: 0 Active Problems Location of Pain Severity and Description of Pain Patient Has Paino No Site Locations Pain Management and Medication Current Pain Management: Electronic Signature(s) Signed: 07/19/2019 3:23:18 PM By: [name] [name] RCP, RRT, CHT Signed: 07/19/2019 4:19:51 PM By: [name] [name] Entered By: [name] [name] on 07/19/2019 14:13:59 [name], [name] ([id number]) -------------------------------------------------------------------------------- Wound Assessment Details Patient Name: [name] [name]. Date of Service: 07/19/2019 2:15 PM Medical Record Number: [id number] Patient Account Number: [id number] Date of Birth/Sex: 08/08/1945 (74 y.o. F) Treating RN: [name] [name] Primary Care Orazio Weller: [name] [name] Other Clinician: Referring Kamila Broda: [name] [name] Treating Cainan Trull/Extender: [name] [name], [name] Weeks in Treatment: 0 Wound Status Wound Number: 1 Primary Venous Leg Ulcer Etiology: Wound Location: Left, Medial Lower Leg Wound Status: Open Wounding Event: Gradually Appeared Comorbid Chronic Obstructive Pulmonary Disease (COPD), Date Acquired: 06/17/2019 History: Hypertension Weeks Of Treatment: 0 Clustered Wound: No Photos Wound Measurements Length: (cm)  5 Width: (cm) 4.5 Depth: (cm) 0.1 Area: (cm) 17.671 Volume: (cm) 1.767 % Reduction in Area: % Reduction in Volume: Epithelialization: Medium (34-66%) Tunneling: No Undermining: No Wound Description Classification: Full Thickness Without Exposed Support Structu Wound Margin: Flat and Intact Exudate Amount: Medium Exudate Type: Serous Exudate Color: amber res Foul Odor After Cleansing: No Slough/Fibrino Yes Wound Bed Granulation Amount: Large (67-100%) Exposed Structure Granulation Quality: Pink Fascia Exposed: No Necrotic Amount: Small (1-33%) Fat Layer (Subcutaneous Tissue) Exposed: Yes Necrotic Quality: Adherent Slough Tendon Exposed: No Muscle Exposed: No Joint Exposed: No Bone Exposed: No Electronic Signature(s) Signed: 07/19/2019 4:26:56 PM By: [name] [name] Entered By: [name] [name] on 07/19/2019 14:29:24 [name] [name] ([id number]) -------------------------------------------------------------------------------- Vitals Details Patient Name: [name] [name]. Date of Service: 07/19/2019 2:15 PM Medical Record Number: [id number] Patient Account Number: [id number] Date of Birth/Sex: 1946-02-16 (74 y.o. F) Treating RN: [name] [name] Primary Care Chibuikem Thang: [name] [name] Other Clinician: Referring Dragan Tamburrino: [name] [name] Treating Mattingly Fountaine/Extender: [name] [name], [name] Weeks in Treatment: 0 Vital Signs Time Taken: 14:15 Temperature (F): 99.4 Height (in): 67 Pulse (bpm): 112 Source: Stated Respiratory Rate (breaths/min): 18 Weight (  lbs): 194 Blood Pressure (mmHg): 156/94 Source: Measured Reference Range: 80 - 120 mg / dl Body Mass Index (BMI): 30.4 Electronic Signature(s) Signed: 07/19/2019 3:23:18 PM By: Lorine Bears RCP, RRT, CHT Entered By: Lorine Bears on 07/19/2019 14:19:09

## 2019-07-19 NOTE — Progress Notes (Signed)
[name] [name], TOWSON ([id number]) Visit Report for 07/19/2019 Chief Complaint Document Details Patient Name: [name] [name]. Date of Service: 07/19/2019 2:15 PM Medical Record Number: [id number] Patient Account Number: [id number] Date of Birth/Sex: [birth year] (74 y.o. F) Treating RN: [name] [name] Primary Care Provider: Tracie [name] Other Clinician: Referring Provider: Tracie [name] Treating Provider/Extender: [name] [name]: 0 Information Obtained from: Patient Chief Complaint Left LE Ulcer Electronic Signature(s) Signed: 07/19/2019 2:45:19 PM By: [name] Keeler PA-C Entered By: [name] [name] on 07/19/2019 14:45:19 [name] [name] ([id number]) -------------------------------------------------------------------------------- HPI Details Patient Name: [name] [name]. Date of Service: 07/19/2019 2:15 PM Medical Record Number: [id number] Patient Account Number: [id number] Date of Birth/Sex: 07/20/1945 (74 y.o. F) Treating RN: [name] [name] Primary Care Provider: Tracie [name] Other Clinician: Referring Provider: Tracie [name] Treating Provider/Extender: [name] [name], [name] [name]: 0 History of Present Illness HPI Description: 07/19/2019 upon evaluation today patient presents for initial inspection here in our clinic concerning issues that she has been having with her left lower extremity and she tells me this has been about 2 months. Dr. Ginette [name] referred her to [place] due to the fact that this has been going on so long. With that being said it appears that he has been managing this quite well in my opinion. The patient's been using a little antibiotic ointment on the area followed by actually a folded up paper towel as she states the gauze actually bothered her. Subsequently she has been using Covan to hold this in place. Fortunately there is no signs of active infection at this time. No fevers, chills, nausea, vomiting, or diarrhea. The patient is a prior smoker she  quit 5 years ago. She also has a history of hypertension and COPD she is on oxygen supplementally all the time. Electronic Signature(s) Signed: 07/19/2019 3:11:57 PM By: [name] Keeler PA-C Previous Signature: 07/19/2019 3:11:38 PM Version By: [name] Keeler PA-C Entered By: [name] [name] on 07/19/2019 15:11:56 [name] [name], [name] [name] ([id number]) -------------------------------------------------------------------------------- Physical Exam Details Patient Name: [name] [name]. Date of Service: 07/19/2019 2:15 PM Medical Record Number: [id number] Patient Account Number: [id number] Date of Birth/Sex: [birth year] (74 y.o. F) Treating RN: [name] [name] Primary Care Provider: Tracie [name] Other Clinician: Referring Provider: Tracie [name] Treating Provider/Extender: [name] [name]: 0 Constitutional patient is hypertensive.. pulse regular but elevated. respirations regular, non-labored and within target range for patient.[name] temperature within target range for patient.. Well-nourished and well-hydrated in no acute distress. Eyes conjunctiva clear no eyelid edema noted. pupils equal round and reactive to light and accommodation. Ears, Nose, Mouth, and Throat no gross abnormality of ear auricles or external auditory canals. normal hearing noted during conversation. mucus membranes moist. Respiratory normal breathing without difficulty. clear to auscultation bilaterally. Cardiovascular regular rhythm with normal S1, S2. 2+ dorsalis pedis/posterior tibialis pulses. Patient has bilateral stage I lymphedema.. Musculoskeletal normal gait and posture. no significant deformity or arthritic changes, no loss or range of motion, no clubbing. Psychiatric this patient is able to make decisions and demonstrates good insight into disease process. Alert and Oriented x 3. pleasant and cooperative. Notes Upon inspection patient's wound actually appears to be almost completely healed she has a lot  of new epithelial growth and there does not appear to be any signs of active infection which is great news. No fever chills noted her pulse is regular however she states that she typically runs somewhere around 115-120. She tells me this is normal and they have not found  a reason for this in her situation. With that being said she does not appear to be any bit distressed at this point her lungs are clear and overall her heart is very regular even if the rate is elevated. Electronic Signature(s) Signed: 07/19/2019 3:13:50 PM By: [name] Kelp PA-C Entered By: [name] [name] on 07/19/2019 15:13:49 [name] [name], [name] [name] ([id number]) -------------------------------------------------------------------------------- Physician Orders Details Patient Name: [name] [name]. Date of Service: 07/19/2019 2:15 PM Medical Record Number: [id number] Patient Account Number: [id number] Date of Birth/Sex: 20-May-1945 (74 y.o. F) Treating RN: [name] [name] Primary Care Provider: Barbette Reichmann Other Clinician: Referring Provider: Barbette Reichmann Treating Provider/Extender: Linwood Dibbles, [name] [name]: 0 Verbal / Phone Orders: No Diagnosis Coding ICD-10 Coding Code Description I87.2 Venous insufficiency (chronic) (peripheral) I89.0 Lymphedema, not elsewhere classified L97.822 Non-pressure chronic ulcer of other part of left lower leg with fat layer exposed I10 Essential (primary) hypertension Wound Cleansing Wound #1 Left,Medial Lower Leg o Clean wound with Normal Saline. - in office o Cleanse wound with mild soap and water Skin Barriers/Peri-Wound Care Wound #1 Left,Medial Lower Leg o Other: - neosporin Secondary Dressing Wound #1 Left,Medial Lower Leg o Non-adherent pad Dressing Change Frequency Wound #1 Left,Medial Lower Leg o Change dressing every day. Follow-up Appointments Wound #1 Left,Medial Lower Leg o Return Appointment in 1 week. Edema Control Wound #1 Left,Medial Lower  Leg o Other: - Tubi grip Electronic Signature(s) Signed: 07/19/2019 4:19:51 PM By: [name] [name] Signed: 07/19/2019 5:05:18 PM By: [name] Kelp PA-C Entered By: [name] [name] on 07/19/2019 15:03:16 Nuzzo, Shakelia H. ([id number]) -------------------------------------------------------------------------------- Problem List Details Patient Name: MANIYA, DONOVAN H. Date of Service: 07/19/2019 2:15 PM Medical Record Number: [id number] Patient Account Number: [id number] Date of Birth/Sex: January 23, 1946 (74 y.o. F) Treating RN: [name] [name] Primary Care Provider: Barbette Reichmann Other Clinician: Referring Provider: Barbette Reichmann Treating Provider/Extender: Linwood Dibbles, Amyria Komar Weeks in [name]: 0 Active Problems ICD-10 Encounter Code Description Active Date MDM Diagnosis I87.2 Venous insufficiency (chronic) (peripheral) 07/19/2019 No Yes I89.0 Lymphedema, not elsewhere classified 07/19/2019 No Yes L97.822 Non-pressure chronic ulcer of other part of left lower leg with fat layer 07/19/2019 No Yes exposed I10 Essential (primary) hypertension 07/19/2019 No Yes Inactive Problems Resolved Problems Electronic Signature(s) Signed: 07/19/2019 2:44:53 PM By: [name] Kelp PA-C Entered By: [name] [name] on 07/19/2019 14:44:52 [name] [name], [name] ([id number]) -------------------------------------------------------------------------------- Progress Note Details Patient Name: [name] [name]. Date of Service: 07/19/2019 2:15 PM Medical Record Number: [id number] Patient Account Number: [id number] Date of Birth/Sex: [birth year] (74 y.o. F) Treating RN: [name] [name] Primary Care Provider: Barbette Reichmann Other Clinician: Referring Provider: Barbette Reichmann Treating Provider/Extender: Linwood Dibbles, Jarmarcus Wambold Weeks in [name]: 0 Subjective Chief Complaint Information obtained from Patient Left LE Ulcer History of Present Illness (HPI) 07/19/2019 upon evaluation today patient presents for initial inspection here in our clinic  concerning issues that she has been having with her left lower extremity and she tells me this has been about 2 months. Dr. Marcello Fennel referred her to [place] due to the fact that this has been going on so long. With that being said it appears that he has been managing this quite well in my opinion. The patient's been using a little antibiotic ointment on the area followed by actually a folded up paper towel as she states the gauze actually bothered her. Subsequently she has been using Covan to hold this in place. Fortunately there is no signs of active infection at this time. No fevers, chills, nausea, vomiting, or diarrhea. The  patient is a prior smoker she quit 5 years ago. She also has a history of hypertension and COPD she is on oxygen supplementally all the time. Patient History Information obtained from Patient. Allergies No Known Drug Allergies Family History Cancer - Father, No family history of Diabetes, Heart Disease, Hereditary Spherocytosis, Hypertension, Kidney Disease, Lung Disease, Seizures, Stroke, Thyroid Problems, Tuberculosis. Social History Former smoker - 5 years, Marital Status - Widowed, Alcohol Use - Never, Drug Use - No History, Caffeine Use - Daily. Medical History Respiratory Patient has history of Chronic Obstructive Pulmonary Disease (COPD) - home O2 Denies history of Aspiration, Asthma, Pneumothorax, Sleep Apnea, Tuberculosis Cardiovascular Patient has history of Hypertension Denies history of Angina, Arrhythmia, Congestive Heart Failure, Coronary Artery Disease, Deep Vein Thrombosis, Hypotension, Myocardial Infarction, Peripheral Arterial Disease, Peripheral Venous Disease, Phlebitis, Vasculitis Integumentary (Skin) Denies history of History of Burn, History of pressure wounds Musculoskeletal Denies history of Gout, Rheumatoid Arthritis, Osteoarthritis, Osteomyelitis Psychiatric Denies history of Anorexia/bulimia, Confinement Anxiety Medical And Surgical History  Notes Musculoskeletal OP Review of Systems (ROS) Constitutional Symptoms (General Health) Denies complaints or symptoms of Fatigue, Fever, Chills, Marked Weight Change. Eyes Denies complaints or symptoms of Dry Eyes, Vision Changes, Glasses / Contacts. Ear/Nose/Mouth/Throat Denies complaints or symptoms of Difficult clearing ears, Sinusitis. Hematologic/Lymphatic Denies complaints or symptoms of Bleeding / Clotting Disorders, Human Immunodeficiency Virus. Respiratory Denies complaints or symptoms of Chronic or frequent coughs, Shortness of Breath. Cardiovascular Complains or has symptoms of LE edema. Denies complaints or symptoms of Chest pain. Gastrointestinal Denies complaints or symptoms of Frequent diarrhea, Nausea, Vomiting. Endocrine [name] BuffyERRY, Ovida H. ([id number]) Denies complaints or symptoms of Hepatitis, Thyroid disease, Polydypsia (Excessive Thirst). Genitourinary Denies complaints or symptoms of Kidney failure/ Dialysis, Incontinence/dribbling. Immunological Denies complaints or symptoms of Hives, Itching. Integumentary (Skin) Complains or has symptoms of Wounds. Denies complaints or symptoms of Bleeding or bruising tendency, Breakdown, Swelling. Musculoskeletal Denies complaints or symptoms of Muscle Pain, Muscle Weakness. Neurologic Denies complaints or symptoms of Numbness/parasthesias, Focal/Weakness. Psychiatric Complains or has symptoms of Anxiety. Denies complaints or symptoms of Claustrophobia. Objective Constitutional patient is hypertensive.. pulse regular but elevated. respirations regular, non-labored and within target range for patient.[name]. temperature within target range for patient.. Well-nourished and well-hydrated in no acute distress. Vitals Time Taken: 2:15 PM, Height: 67 in, Source: Stated, Weight: 194 lbs, Source: Measured, BMI: 30.4, Temperature: 99.4 F, Pulse: 112 bpm, Respiratory Rate: 18 breaths/min, Blood Pressure: 156/94  mmHg. Eyes conjunctiva clear no eyelid edema noted. pupils equal round and reactive to light and accommodation. Ears, Nose, Mouth, and Throat no gross abnormality of ear auricles or external auditory canals. normal hearing noted during conversation. mucus membranes moist. Respiratory normal breathing without difficulty. clear to auscultation bilaterally. Cardiovascular regular rhythm with normal S1, S2. 2+ dorsalis pedis/posterior tibialis pulses. Patient has bilateral stage I lymphedema.. Musculoskeletal normal gait and posture. no significant deformity or arthritic changes, no loss or range of motion, no clubbing. Psychiatric this patient is able to make decisions and demonstrates good insight into disease process. Alert and Oriented x 3. pleasant and cooperative. General Notes: Upon inspection patient's wound actually appears to be almost completely healed she has a lot of new epithelial growth and there does not appear to be any signs of active infection which is great news. No fever chills noted her pulse is regular however she states that she typically runs somewhere around 115-120. She tells me this is normal and they have not found a reason for this in her situation. With that  being said she does not appear to be any bit distressed at this point her lungs are clear and overall her heart is very regular even if the rate is elevated. Integumentary (Hair, Skin) Wound #1 status is Open. Original cause of wound was Gradually Appeared. The wound is located on the Left,Medial Lower Leg. The wound measures 5cm length x 4.5cm width x 0.1cm depth; 17.671cm^2 area and 1.767cm^3 volume. There is Fat Layer (Subcutaneous Tissue) Exposed exposed. There is no tunneling or undermining noted. There is a medium amount of serous drainage noted. The wound margin is flat and intact. There is large (67-100%) pink granulation within the wound bed. There is a small (1-33%) amount of necrotic tissue within the  wound bed including Adherent Slough. Assessment Active Problems ICD-10 Venous insufficiency (chronic) (peripheral) Lymphedema, not elsewhere classified Non-pressure chronic ulcer of other part of left lower leg with fat layer exposed Essential (primary) hypertension [name] [name], [name] ([id number]) Plan Wound Cleansing: Wound #1 Left,Medial Lower Leg: Clean wound with Normal Saline. - in office Cleanse wound with mild soap and water Skin Barriers/Peri-Wound Care: Wound #1 Left,Medial Lower Leg: Other: - neosporin Secondary Dressing: Wound #1 Left,Medial Lower Leg: Non-adherent pad Dressing Change Frequency: Wound #1 Left,Medial Lower Leg: Change dressing every day. Follow-up Appointments: Wound #1 Left,Medial Lower Leg: Return Appointment in 1 week. Edema Control: Wound #1 Left,Medial Lower Leg: Other: - Tubi grip 1. My suggestion at this time is can be that we go ahead and initiate [name] with continuation of the Neosporin as this is almost completely healed I think she is done a great job taking care of this. However a full substitute for her paper towel a Telfa nonadherent dressing which I think would be better. 2. I am also can recommend at this time that the patient actually continue to monitor for any signs of infection though I do not see anything at the moment. She has been on multiple rounds of antibiotics she tells me at least twice and that seems to have cleared up the infection based on what I am seeing. 3. I am going recommend that she needs to elevate her legs is much as possible will be using Tubigrip on her to help with edema control. We will see patient back for reevaluation in 1 week here in the clinic. If anything worsens or changes patient will contact our office for additional recommendations. Electronic Signature(s) Signed: 07/19/2019 3:14:42 PM By: [name] Kelp PA-C Entered By: [name] [name] on 07/19/2019 15:14:41 [name] [name], [name] [name]  ([id number]) -------------------------------------------------------------------------------- ROS/PFSH Details Patient Name: [name] [name]. Date of Service: 07/19/2019 2:15 PM Medical Record Number: [id number] Patient Account Number: [id number] Date of Birth/Sex: [birth year] (75 y.o. F) Treating RN: Curtis Sites Primary Care Provider: Barbette Reichmann Other Clinician: Referring Provider: Barbette Reichmann Treating Provider/Extender: Linwood Dibbles, Kirbi Farrugia Weeks in [name]: 0 Information Obtained From Patient Constitutional Symptoms (General Health) Complaints and Symptoms: Negative for: Fatigue; Fever; Chills; Marked Weight Change Eyes Complaints and Symptoms: Negative for: Dry Eyes; Vision Changes; Glasses / Contacts Ear/Nose/Mouth/Throat Complaints and Symptoms: Negative for: Difficult clearing ears; Sinusitis Hematologic/Lymphatic Complaints and Symptoms: Negative for: Bleeding / Clotting Disorders; Human Immunodeficiency Virus Respiratory Complaints and Symptoms: Negative for: Chronic or frequent coughs; Shortness of Breath Medical History: Positive for: Chronic Obstructive Pulmonary Disease (COPD) - home O2 Negative for: Aspiration; Asthma; Pneumothorax; Sleep Apnea; Tuberculosis Cardiovascular Complaints and Symptoms: Positive for: LE edema Negative for: Chest pain Medical History: Positive for: Hypertension Negative for: Angina; Arrhythmia; Congestive Heart Failure; Coronary  Artery Disease; Deep Vein Thrombosis; Hypotension; Myocardial Infarction; Peripheral Arterial Disease; Peripheral Venous Disease; Phlebitis; Vasculitis Gastrointestinal Complaints and Symptoms: Negative for: Frequent diarrhea; Nausea; Vomiting Endocrine Complaints and Symptoms: Negative for: Hepatitis; Thyroid disease; Polydypsia (Excessive Thirst) Genitourinary Complaints and Symptoms: Negative for: Kidney failure/ Dialysis; Incontinence/dribbling Immunological [name] [name], LAMPHEAR.  ([id number]) Complaints and Symptoms: Negative for: Hives; Itching Integumentary (Skin) Complaints and Symptoms: Positive for: Wounds Negative for: Bleeding or bruising tendency; Breakdown; Swelling Medical History: Negative for: History of Burn; History of pressure wounds Musculoskeletal Complaints and Symptoms: Negative for: Muscle Pain; Muscle Weakness Medical History: Negative for: Gout; Rheumatoid Arthritis; Osteoarthritis; Osteomyelitis Past Medical History Notes: OP Neurologic Complaints and Symptoms: Negative for: Numbness/parasthesias; Focal/Weakness Psychiatric Complaints and Symptoms: Positive for: Anxiety Negative for: Claustrophobia Medical History: Negative for: Anorexia/bulimia; Confinement Anxiety Oncologic Immunizations Pneumococcal Vaccine: Received Pneumococcal Vaccination: Yes Implantable Devices None Family and Social History Cancer: Yes - Father; Diabetes: No; Heart Disease: No; Hereditary Spherocytosis: No; Hypertension: No; Kidney Disease: No; Lung Disease: No; Seizures: No; Stroke: No; Thyroid Problems: No; Tuberculosis: No; Former smoker - 5 years; Marital Status - Widowed; Alcohol Use: Never; Drug Use: No History; Caffeine Use: Daily; Financial Concerns: No; Food, Clothing or Shelter Needs: No; Support System Lacking: No; Transportation Concerns: No Electronic Signature(s) Signed: 07/19/2019 4:26:56 PM By: Curtis Sites Signed: 07/19/2019 5:05:18 PM By: [name] Kelp PA-C Entered By: Curtis Sites on 07/19/2019 14:26:33 [name] [name], [name] [name] ([id number]) -------------------------------------------------------------------------------- SuperBill Details Patient Name: [name] [name]. Date of Service: 07/19/2019 Medical Record Number: [id number] Patient Account Number: [id number] Date of Birth/Sex: October 07, 1945 (74 y.o. F) Treating RN: [name] [name] Primary Care Provider: Barbette Reichmann Other Clinician: Referring Provider: Barbette Reichmann Treating  Provider/Extender: Linwood Dibbles, Hadyn Blanck Weeks in [name]: 0 Diagnosis Coding ICD-10 Codes Code Description I87.2 Venous insufficiency (chronic) (peripheral) I89.0 Lymphedema, not elsewhere classified L97.822 Non-pressure chronic ulcer of other part of left lower leg with fat layer exposed I10 Essential (primary) hypertension Facility Procedures CPT4 Code: [id number] Description: 99213 - WOUND CARE VISIT-LEV 3 EST PT Modifier: Quantity: 1 Physician Procedures CPT4 Code: [id number] Description: WC PHYS LEVEL 3 o NEW PT Modifier: Quantity: 1 CPT4 Code: Description: ICD-10 Diagnosis Description I87.2 Venous insufficiency (chronic) (peripheral) I89.0 Lymphedema, not elsewhere classified L97.822 Non-pressure chronic ulcer of other part of left lower leg with fat l I10 Essential (primary) hypertension Modifier: ayer exposed Quantity: Electronic Signature(s) Signed: 07/19/2019 3:14:56 PM By: [name] Kelp PA-C Entered By: [name] [name] on 07/19/2019 15:14:56

## 2019-07-19 NOTE — Progress Notes (Signed)
SELYNA, KLAHN ([id number]) Visit Report for 07/19/2019 Abuse/Suicide Risk Screen Details Patient Name: [name] [name], [name] [name]. Date of Service: 07/19/2019 2:15 PM Medical Record Number: [id number] Patient Account Number: [id number] Date of Birth/Sex: [birth year] (74 y.o. F) Treating RN: Montey Hora Primary Care Niurka Benecke: Tracie Harrier Other Clinician: Referring Megen Madewell: Tracie Harrier Treating Nikita Surman/Extender: Melburn Hake, HOYT Weeks in Treatment: 0 Abuse/Suicide Risk Screen Items Answer ABUSE RISK SCREEN: Has anyone close to you tried to hurt or harm you recentlyo No Do you feel uncomfortable with anyone in your familyo No Has anyone forced you do things that you didnot want to doo No Electronic Signature(s) Signed: 07/19/2019 4:26:56 PM By: Montey Hora Entered By: Montey Hora on 07/19/2019 14:22:09 Naugle, Aowyn Lemmie Evens ([id number]) -------------------------------------------------------------------------------- Activities of Daily Living Details Patient Name: [name] [name]. Date of Service: 07/19/2019 2:15 PM Medical Record Number: [id number] Patient Account Number: [id number] Date of Birth/Sex: [birth year] (74 y.o. F) Treating RN: Montey Hora Primary Care Jennae Hakeem: Tracie Harrier Other Clinician: Referring Areonna Bran: Tracie Harrier Treating Sadhana Frater/Extender: Melburn Hake, HOYT Weeks in Treatment: 0 Activities of Daily Living Items Answer Activities of Daily Living (Please select one for each item) Drive Automobile Not Able Take Medications Completely Able Use Telephone Completely Able Care for Appearance Completely Able Use Toilet Completely Able Bath / Shower Completely Able Dress Self Completely Able Feed Self Completely Able Walk Completely Able Get In / Out Bed Completely Able Housework Completely Able Prepare Meals Completely [place] for Self Completely Able Electronic Signature(s) Signed: 07/19/2019 4:26:56 PM By: Montey Hora Entered By: Montey Hora on 07/19/2019 14:22:31 [place], Maraya Lemmie Evens ([id number]) -------------------------------------------------------------------------------- Education Screening Details Patient Name: [name] [name]. Date of Service: 07/19/2019 2:15 PM Medical Record Number: [id number] Patient Account Number: [id number] Date of Birth/Sex: 1945/04/30 (74 y.o. F) Treating RN: Montey Hora Primary Care Tilak Oakley: Tracie Harrier Other Clinician: Referring Marlaina Coburn: Tracie Harrier Treating Gwyn Hieronymus/Extender: Melburn Hake, HOYT Weeks in Treatment: 0 Primary Learner Assessed: Patient Learning Preferences/Education Level/Primary Language Learning Preference: Explanation, Demonstration Highest Education Level: High School Preferred Language: English Cognitive Barrier Language Barrier: No Translator Needed: No Memory Deficit: No Emotional Barrier: No Cultural/Religious Beliefs Affecting Medical Care: No Physical Barrier Impaired Vision: No Impaired Hearing: No Decreased Hand dexterity: No Knowledge/Comprehension Knowledge Level: Medium Comprehension Level: Medium Ability to understand written instructions: Medium Ability to understand verbal instructions: Medium Motivation Anxiety Level: Calm Cooperation: Cooperative Education Importance: Acknowledges Need Interest in Health Problems: Asks Questions Perception: Coherent Willingness to Engage in Self-Management Medium Activities: Readiness to Engage in Self-Management Medium Activities: Electronic Signature(s) Signed: 07/19/2019 4:26:56 PM By: Montey Hora Entered By: Montey Hora on 07/19/2019 14:23:23 Mcmanamon, Clarene Duke ([id number]) -------------------------------------------------------------------------------- Fall Risk Assessment Details Patient Name: [name] [name]. Date of Service: 07/19/2019 2:15 PM Medical Record Number: [id number] Patient Account Number: [id number] Date of Birth/Sex: 09-02-1945 (74 y.o. F) Treating  RN: Montey Hora Primary Care Daylyn Christine: Tracie Harrier Other Clinician: Referring Alaiyah Bollman: Tracie Harrier Treating Dari Carpenito/Extender: Melburn Hake, HOYT Weeks in Treatment: 0 Fall Risk Assessment Items Have you had 2 or more falls in the last 12 monthso 0 No Have you had any fall that resulted in injury in the last 12 monthso 0 No FALLS RISK SCREEN History of falling - immediate or within 3 months 0 No Secondary diagnosis (Do you have 2 or more medical diagnoseso) 0 No Ambulatory aid None/bed rest/wheelchair/nurse 0 No Crutches/cane/walker 15 Yes Furniture 0 No Intravenous therapy Access/Saline/Heparin Lock 0 No Gait/Transferring Normal/ bed rest/ wheelchair 0 Yes Weak (short steps  with or without shuffle, stooped but able to lift head while walking, may 0 No seek support from furniture) Impaired (short steps with shuffle, may have difficulty arising from chair, head down, impaired 0 No balance) Mental Status Oriented to own ability 0 Yes Electronic Signature(s) Signed: 07/19/2019 4:26:56 PM By: Curtis Sites Entered By: Curtis Sites on 07/19/2019 14:22:44 Motz, [place] Rexene Edison ([id number]) -------------------------------------------------------------------------------- Foot Assessment Details Patient Name: [name] [name]. Date of Service: 07/19/2019 2:15 PM Medical Record Number: [id number] Patient Account Number: [id number] Date of Birth/Sex: 1945-10-13 (74 y.o. F) Treating RN: Curtis Sites Primary Care Amerika Nourse: Barbette Reichmann Other Clinician: Referring Nidal Rivet: Barbette Reichmann Treating Dellie Piasecki/Extender: Linwood Dibbles, HOYT Weeks in Treatment: 0 Foot Assessment Items Site Locations + = Sensation present, - = Sensation absent, C = Callus, U = Ulcer R = Redness, W = Warmth, M = Maceration, PU = Pre-ulcerative lesion F = Fissure, S = Swelling, D = Dryness Assessment Right: Left: Other Deformity: No No Prior Foot Ulcer: No No Prior Amputation: No No Charcot Joint: No  No Ambulatory Status: Ambulatory Without Help Gait: Steady Electronic Signature(s) Signed: 07/19/2019 4:26:56 PM By: Curtis Sites Entered By: Curtis Sites on 07/19/2019 14:22:58 Delarosa, Mahiya Rexene Edison ([id number]) -------------------------------------------------------------------------------- Nutrition Risk Screening Details Patient Name: Giebler, Aleeza H. Date of Service: 07/19/2019 2:15 PM Medical Record Number: [id number] Patient Account Number: [id number] Date of Birth/Sex: 1945/12/11 (74 y.o. F) Treating RN: Curtis Sites Primary Care Maridee Slape: Barbette Reichmann Other Clinician: Referring Kamil Hanigan: Barbette Reichmann Treating Ayani Ospina/Extender: Linwood Dibbles, HOYT Weeks in Treatment: 0 Height (in): 67 Weight (lbs): 194 Body Mass Index (BMI): 30.4 Nutrition Risk Screening Items Score Screening NUTRITION RISK SCREEN: I have an illness or condition that made me change the kind and/or amount of food I eat 0 No I eat fewer than two meals per day 0 No I eat few fruits and vegetables, or milk products 0 No I have three or more drinks of beer, liquor or wine almost every day 0 No I have tooth or mouth problems that make it hard for me to eat 0 No I don't always have enough money to buy the food I need 0 No I eat alone most of the time 0 No I take three or more different prescribed or over-the-counter drugs a day 1 Yes Without wanting to, I have lost or gained 10 pounds in the last six months 0 No I am not always physically able to shop, cook and/or feed myself 0 No Nutrition Protocols Good Risk Protocol 0 No interventions needed Moderate Risk Protocol High Risk Proctocol Risk Level: Good Risk Score: 1 Electronic Signature(s) Signed: 07/19/2019 4:26:56 PM By: Curtis Sites Entered By: Curtis Sites on 07/19/2019 14:22:51

## 2019-07-26 ENCOUNTER — Encounter: Payer: Medicare Other | Admitting: Physician Assistant

## 2019-07-26 ENCOUNTER — Other Ambulatory Visit: Payer: Self-pay

## 2019-07-26 DIAGNOSIS — I872 Venous insufficiency (chronic) (peripheral): Secondary | ICD-10-CM | POA: Diagnosis not present

## 2019-07-26 NOTE — Progress Notes (Signed)
JASIA, HILTUNEN ([id number]) Visit Report for 07/26/2019 Arrival Information Details Patient Name: [name] [name], [name] [name]. Date of Service: 07/26/2019 12:45 PM Medical Record Number: [id number] Patient Account Number: [id number] Date of Birth/Sex: 05/23/1945 (74 y.o. F) Treating RN: Curtis Sites Primary Care Aundra Espin: Barbette Reichmann Other Clinician: Referring Marget Outten: Barbette Reichmann Treating Vitaly Wanat/Extender: Linwood Dibbles, HOYT Weeks in Treatment: 1 Visit Information History Since Last Visit Added or deleted any medications: No Patient Arrived: Ambulatory Any new allergies or adverse reactions: No Arrival Time: 12:44 Had a fall or experienced change in No Accompanied By: self activities of daily living that may affect Transfer Assistance: None risk of falls: Patient Identification Verified: Yes Signs or symptoms of abuse/neglect since last visito No Secondary Verification Process Completed: Yes Hospitalized since last visit: No Implantable device outside of the clinic excluding No cellular tissue based products placed in the center since last visit: Has Dressing in Place as Prescribed: Yes Has Compression in Place as Prescribed: Yes Pain Present Now: No Electronic Signature(s) Signed: 07/26/2019 4:26:48 PM By: Curtis Sites Entered By: Curtis Sites on 07/26/2019 12:44:25 Willems, Lourine Rexene Edison ([id number]) -------------------------------------------------------------------------------- Clinic Level of Care Assessment Details Patient Name: [name]. Date of Service: 07/26/2019 12:45 PM Medical Record Number: [id number] Patient Account Number: [id number] Date of Birth/Sex: 09/24/1945 (74 y.o. F) Treating RN: Rodell Perna Primary Care Lidwina Kaner: Barbette Reichmann Other Clinician: Referring Minal Stuller: Barbette Reichmann Treating Bobbye Reinitz/Extender: Linwood Dibbles, HOYT Weeks in Treatment: 1 Clinic Level of Care Assessment Items TOOL 4 Quantity Score []  - Use when only an EandM is performed on  FOLLOW-UP visit 0 ASSESSMENTS - Nursing Assessment / Reassessment X - Reassessment of Co-morbidities (includes updates in patient status) 1 10 X- 1 5 Reassessment of Adherence to Treatment Plan ASSESSMENTS - Wound and Skin Assessment / Reassessment X - Simple Wound Assessment / Reassessment - one wound 1 5 []  - 0 Complex Wound Assessment / Reassessment - multiple wounds []  - 0 Dermatologic / Skin Assessment (not related to wound area) ASSESSMENTS - Focused Assessment []  - Circumferential Edema Measurements - multi extremities 0 []  - 0 Nutritional Assessment / Counseling / Intervention []  - 0 Lower Extremity Assessment (monofilament, tuning fork, pulses) []  - 0 Peripheral Arterial Disease Assessment (using hand held doppler) ASSESSMENTS - Ostomy and/or Continence Assessment and Care []  - Incontinence Assessment and Management 0 []  - 0 Ostomy Care Assessment and Management (repouching, etc.) PROCESS - Coordination of Care X - Simple Patient / Family Education for ongoing care 1 15 []  - 0 Complex (extensive) Patient / Family Education for ongoing care []  - 0 Staff obtains , Records, Test Results / Process Orders []  - 0 Staff telephones HHA, Nursing Homes / Clarify orders / etc []  - 0 Routine Transfer to another Facility (non-emergent condition) []  - 0 Routine Hospital Admission (non-emergent condition) []  - 0 New Admissions / / Ordering NPWT, Apligraf, etc. []  - 0 Emergency Hospital Admission (emergent condition) X- 1 10 Simple Discharge Coordination []  - 0 Complex (extensive) Discharge Coordination PROCESS - Special Needs []  - Pediatric / Minor Patient Management 0 []  - 0 Isolation Patient Management []  - 0 Hearing / Language / Visual special needs []  - 0 Assessment of Community assistance (transportation, D/C planning, etc.) []  - 0 Additional assistance / Altered mentation []  - 0 Support Surface(s) Assessment (bed, cushion, seat,  etc.) INTERVENTIONS - Wound Cleansing / Measurement Kercher, Ranika H. ( ) X- 1 5 Simple Wound Cleansing - one wound []  - 0 Complex Wound Cleansing - multiple  wounds X- 1 5 Wound Imaging (photographs - any number of wounds) []  - 0 Wound Tracing (instead of photographs) X- 1 5 Simple Wound Measurement - one wound []  - 0 Complex Wound Measurement - multiple wounds INTERVENTIONS - Wound Dressings []  - Small Wound Dressing one or multiple wounds 0 X- 1 15 Medium Wound Dressing one or multiple wounds []  - 0 Large Wound Dressing one or multiple wounds []  - 0 Application of Medications - topical []  - 0 Application of Medications - injection INTERVENTIONS - Miscellaneous []  - External ear exam 0 []  - 0 Specimen Collection (cultures, biopsies, blood, body fluids, etc.) []  - 0 Specimen(s) / Culture(s) sent or taken to Lab for analysis []  - 0 Patient Transfer (multiple staff / Civil Service fast streamer / Similar devices) []  - 0 Simple Staple / Suture removal (25 or less) []  - 0 Complex Staple / Suture removal (26 or more) []  - 0 Hypo / Hyperglycemic Management (close monitor of Blood Glucose) []  - 0 Ankle / Brachial Index (ABI) - do not check if billed separately X- 1 5 Vital Signs Has the patient been seen at the hospital within the last three years: Yes Total Score: 80 Level Of Care: New/Established - Level 3 Electronic Signature(s) Signed: 07/26/2019 4:16:16 PM By: Army Melia Entered By: Army Melia on 07/26/2019 12:59:08 Mccranie, Emalynn Lemmie Evens ([id number]) -------------------------------------------------------------------------------- Encounter Discharge Information Details Patient Name: [name]. Date of Service: 07/26/2019 12:45 PM Medical Record Number: [id number] Patient Account Number: [id number] Date of Birth/Sex: 26-Mar-1945 (74 y.o. F) Treating RN: Army Melia Primary Care Jamarie Mussa: Tracie Harrier Other Clinician: Referring Sonjia Wilcoxson: Tracie Harrier Treating  Nehemyah Foushee/Extender: Melburn Hake, HOYT Weeks in Treatment: 1 Encounter Discharge Information Items Discharge Condition: Stable Ambulatory Status: Ambulatory Discharge Destination: Home Transportation: Private Auto Accompanied By: self Schedule Follow-up Appointment: Yes Clinical Summary of Care: Electronic Signature(s) Signed: 07/26/2019 4:16:16 PM By: Army Melia Entered By: Army Melia on 07/26/2019 13:03:15 Shepler, Tashana Lemmie Evens ([id number]) -------------------------------------------------------------------------------- Lower Extremity Assessment Details Patient Name: [name]. Date of Service: 07/26/2019 12:45 PM Medical Record Number: [id number] Patient Account Number: [id number] Date of Birth/Sex: 09-29-1945 (74 y.o. F) Treating RN: Montey Hora Primary Care Jordann Grime: Tracie Harrier Other Clinician: Referring Aydan Levitz: Tracie Harrier Treating Mehtab Dolberry/Extender: STONE III, HOYT Weeks in Treatment: 1 Edema Assessment Assessed: [Left: No] [Right: No] Edema: [Left: N] [Right: o] Vascular Assessment Pulses: Dorsalis Pedis Palpable: [Left:Yes] Electronic Signature(s) Signed: 07/26/2019 4:26:48 PM By: Montey Hora Entered By: Montey Hora on 07/26/2019 12:47:53 Zweber, Nimo Lemmie Evens ([id number]) -------------------------------------------------------------------------------- Multi Wound Chart Details Patient Name: [name]. Date of Service: 07/26/2019 12:45 PM Medical Record Number: [id number] Patient Account Number: [id number] Date of Birth/Sex: 06-Feb-1946 (74 y.o. F) Treating RN: Army Melia Primary Care Jalyne Brodzinski: Tracie Harrier Other Clinician: Referring Leean Amezcua: Tracie Harrier Treating Jajuan Skoog/Extender: Melburn Hake, HOYT Weeks in Treatment: 1 Vital Signs Height(in): 67 Pulse(bpm): 111 Weight(lbs): 194 Blood Pressure(mmHg): 142/76 Body Mass Index(BMI): 30 Temperature(F): 97.7 Respiratory Rate(breaths/min): 18 Photos: [N/A:N/A] Wound Location: Left,  Medial Lower Leg N/A N/A Wounding Event: Gradually Appeared N/A N/A Primary Etiology: Venous Leg Ulcer N/A N/A Comorbid History: Chronic Obstructive Pulmonary N/A N/A Disease (COPD), Hypertension Date Acquired: 06/17/2019 N/A N/A Weeks of Treatment: 1 N/A N/A Wound Status: Open N/A N/A Measurements L x W x D (cm) 0x0x0 N/A N/A Area (cm) : 0 N/A N/A Volume (cm) : 0 N/A N/A % Reduction in Area: 100.00% N/A N/A % Reduction in Volume: 100.00% N/A N/A Classification: Full Thickness Without Exposed N/A N/A Support Structures Exudate  Amount: None Present N/A N/A Wound Margin: Flat and Intact N/A N/A Granulation Amount: None Present (0%) N/A N/A Necrotic Amount: None Present (0%) N/A N/A Exposed Structures: Fascia: No N/A N/A Fat Layer (Subcutaneous Tissue) Exposed: No Tendon: No Muscle: No Joint: No Bone: No Limited to Skin Breakdown Epithelialization: Large (67-100%) N/A N/A Treatment Notes Electronic Signature(s) Signed: 07/26/2019 4:16:16 PM By: Rodell Perna Entered By: Rodell Perna on 07/26/2019 12:57:22 Canedo, Rhandi Rexene Edison ([id number]) -------------------------------------------------------------------------------- Multi-Disciplinary Care Plan Details Patient Name: [name]. Date of Service: 07/26/2019 12:45 PM Medical Record Number: [id number] Patient Account Number: [id number] Date of Birth/Sex: Feb 20, 1946 (74 y.o. F) Treating RN: Rodell Perna Primary Care River Ambrosio: Barbette Reichmann Other Clinician: Referring Tyneshia Stivers: Barbette Reichmann Treating Marsella Suman/Extender: Linwood Dibbles, HOYT Weeks in Treatment: 1 Active Inactive Orientation to the Wound Care Program Nursing Diagnoses: Knowledge deficit related to the wound healing center program Goals: Patient/caregiver will verbalize understanding of the Wound Healing Center Program Date Initiated: 07/19/2019 Target Resolution Date: 08/20/2019 Goal Status: Active Interventions: Provide education on orientation to the wound  center Notes: Wound/Skin Impairment Nursing Diagnoses: Impaired tissue integrity Goals: Ulcer/skin breakdown will have a volume reduction of 30% by week 4 Date Initiated: 07/19/2019 Target Resolution Date: 08/19/2019 Goal Status: Active Interventions: Assess ulceration(s) every visit Notes: Electronic Signature(s) Signed: 07/26/2019 4:16:16 PM By: Rodell Perna Entered By: Rodell Perna on 07/26/2019 12:57:15 Wass, Jayma Rexene Edison ([id number]) -------------------------------------------------------------------------------- Pain Assessment Details Patient Name: [name]. Date of Service: 07/26/2019 12:45 PM Medical Record Number: [id number] Patient Account Number: [id number] Date of Birth/Sex: 1945/05/01 (74 y.o. F) Treating RN: Curtis Sites Primary Care Ayub Kirsh: Barbette Reichmann Other Clinician: Referring Lennard Capek: Barbette Reichmann Treating Glenard Keesling/Extender: Linwood Dibbles, HOYT Weeks in Treatment: 1 Active Problems Location of Pain Severity and Description of Pain Patient Has Paino No Site Locations Pain Management and Medication Current Pain Management: Electronic Signature(s) Signed: 07/26/2019 4:26:48 PM By: Curtis Sites Entered By: Curtis Sites on 07/26/2019 12:45:03 Landini, Bralyn Rexene Edison ([id number]) -------------------------------------------------------------------------------- Patient/Caregiver Education Details Patient Name: [name]. Date of Service: 07/26/2019 12:45 PM Medical Record Number: [id number] Patient Account Number: [id number] Date of Birth/Gender: Aug 07, 1945 (74 y.o. F) Treating RN: Rodell Perna Primary Care Physician: Barbette Reichmann Other Clinician: Referring Physician: Barbette Reichmann Treating Physician/Extender: Skeet Simmer in Treatment: 1 Education Assessment Education Provided To: Patient Education Topics Provided Wound/Skin Impairment: Handouts: Caring for Your Ulcer Methods: Demonstration, Explain/Verbal Responses: State content  correctly Electronic Signature(s) Signed: 07/26/2019 4:16:16 PM By: Rodell Perna Entered By: Rodell Perna on 07/26/2019 12:59:22 Roppolo, Sameerah Rexene Edison ([id number]) -------------------------------------------------------------------------------- Wound Assessment Details Patient Name: Sherrlyn Hock H. Date of Service: 07/26/2019 12:45 PM Medical Record Number: [id number] Patient Account Number: [id number] Date of Birth/Sex: July 13, 1945 (74 y.o. F) Treating RN: Curtis Sites Primary Care Cashay Manganelli: Barbette Reichmann Other Clinician: Referring Chanse Kagel: Barbette Reichmann Treating Able Malloy/Extender: Linwood Dibbles, HOYT Weeks in Treatment: 1 Wound Status Wound Number: 1 Primary Venous Leg Ulcer Etiology: Wound Location: Left, Medial Lower Leg Wound Status: Open Wounding Event: Gradually Appeared Comorbid Chronic Obstructive Pulmonary Disease (COPD), Date Acquired: 06/17/2019 History: Hypertension Weeks Of Treatment: 1 Clustered Wound: No Photos Wound Measurements Length: (cm) 0 Width: (cm) 0 Depth: (cm) 0 Area: (cm) 0 Volume: (cm) 0 % Reduction in Area: 100% % Reduction in Volume: 100% Epithelialization: Large (67-100%) Tunneling: No Undermining: No Wound Description Classification: Full Thickness Without Exposed Support Structure Wound Margin: Flat and Intact Exudate Amount: None Present s Foul Odor After Cleansing: No Slough/Fibrino No Wound Bed Granulation Amount: None Present (0%) Exposed Structure Necrotic  Amount: None Present (0%) Fascia Exposed: No Fat Layer (Subcutaneous Tissue) Exposed: No Tendon Exposed: No Muscle Exposed: No Joint Exposed: No Bone Exposed: No Limited to Skin Breakdown Treatment Notes Wound #1 (Left, Medial Lower Leg) Notes scell, conform, tubi Electronic Signature(s) Signed: 07/26/2019 4:26:48 PM By: Wille Celeste, Lavone Nian ([id number]) Entered By: Curtis Sites on 07/26/2019 12:48:29 Holtzer, Sawyer Rexene Edison  ([id number]) -------------------------------------------------------------------------------- Vitals Details Patient Name: [name]. Date of Service: 07/26/2019 12:45 PM Medical Record Number: [id number] Patient Account Number: [id number] Date of Birth/Sex: May 19, 1945 (74 y.o. F) Treating RN: Curtis Sites Primary Care Byrne Capek: Barbette Reichmann Other Clinician: Referring Raeann Offner: Barbette Reichmann Treating Latrecia Capito/Extender: Linwood Dibbles, HOYT Weeks in Treatment: 1 Vital Signs Time Taken: 12:48 Temperature (F): 97.7 Height (in): 67 Pulse (bpm): 111 Weight (lbs): 194 Respiratory Rate (breaths/min): 18 Body Mass Index (BMI): 30.4 Blood Pressure (mmHg): 142/76 Reference Range: 80 - 120 mg / dl Electronic Signature(s) Signed: 07/26/2019 4:26:48 PM By: Curtis Sites Entered By: Curtis Sites on 07/26/2019 12:48:54

## 2019-07-26 NOTE — Progress Notes (Signed)
RAMANDEEP, ARINGTON ([id number]) Visit Report for 07/26/2019 Chief Complaint Document Details Patient Name: [name] [name], [name] [name]. Date of Service: 07/26/2019 12:45 PM Medical Record Number: [id number] Patient Account Number: [id number] Date of Birth/Sex: 1946/01/09 (74 y.o. F) Treating RN: Rodell Perna Primary Care Provider: Barbette Reichmann Other Clinician: Referring Provider: Barbette Reichmann Treating Provider/Extender: Linwood Dibbles, Alezander Dimaano Weeks in Treatment: 1 Information Obtained from: Patient Chief Complaint Left LE Ulcer Electronic Signature(s) Signed: 07/26/2019 12:52:58 PM By: Lenda Kelp PA-C Entered By: Lenda Kelp on 07/26/2019 12:52:57 Paver, [name] [name] ([id number]) -------------------------------------------------------------------------------- HPI Details Patient Name: [name] [name]. Date of Service: 07/26/2019 12:45 PM Medical Record Number: [id number] Patient Account Number: [id number] Date of Birth/Sex: [birth year] (74 y.o. F) Treating RN: Rodell Perna Primary Care Provider: Barbette Reichmann Other Clinician: Referring Provider: Barbette Reichmann Treating Provider/Extender: Linwood Dibbles, Lenah Messenger Weeks in Treatment: 1 History of Present Illness HPI Description: 07/19/2019 upon evaluation today patient presents for initial inspection here in our clinic concerning issues that she has been having with her left lower extremity and she tells me this has been about 2 months. Dr. Marcello Fennel referred her to [place] due to the fact that this has been going on so long. With that being said it appears that he has been managing this quite well in my opinion. The patient's been using a little antibiotic ointment on the area followed by actually a folded up paper towel as she states the gauze actually bothered her. Subsequently she has been using Covan to hold this in place. Fortunately there is no signs of active infection at this time. No fevers, chills, nausea, vomiting, or diarrhea. The patient is a prior  smoker she quit 5 years ago. She also has a history of hypertension and COPD she is on oxygen supplementally all the time. 07/26/2019 on evaluation today patient appears to be doing well in general with regard to healing concerning her wound. She has been tolerating the dressing changes without complication. Fortunately there is no signs of active infection at this time. No fevers, chills, nausea, vomiting, or diarrhea. In general I really am pleased with how things seem to be progressing. However the patient though she does appear to be for the most part healed has some pustules around noted at this point which have me concerned about the possibility of infection I believe she may need an antibiotic to help clear this up. Electronic Signature(s) Signed: 07/26/2019 12:59:58 PM By: Lenda Kelp PA-C Entered By: Lenda Kelp on 07/26/2019 12:59:57 [name] [name], [name] [name] ([id number]) -------------------------------------------------------------------------------- Physical Exam Details Patient Name: [name] [name]. Date of Service: 07/26/2019 12:45 PM Medical Record Number: [id number] Patient Account Number: [id number] Date of Birth/Sex: 06/01/1945 (74 y.o. F) Treating RN: Rodell Perna Primary Care Provider: Barbette Reichmann Other Clinician: Referring Provider: Barbette Reichmann Treating Provider/Extender: STONE III, Dailan Pfalzgraf Weeks in Treatment: 1 Constitutional Well-nourished and well-hydrated in no acute distress. Respiratory normal breathing without difficulty. Psychiatric this patient is able to make decisions and demonstrates good insight into disease process. Alert and Oriented x 3. pleasant and cooperative. Notes Upon inspection patient's wound again showed signs of pretty much being completely sealed although she does have several pustules scattered throughout the area that has me concerned about infection. There really is not a good area to culture from therefore no culture was obtained. I am  going to place her on oral antibiotic however. I want to get this under control before it causes any worsening. Electronic Signature(s) Signed: 07/26/2019 1:01:16 PM By: Linwood Dibbles,  Qunisha Bryk PA-C Entered By: Worthy Keeler on 07/26/2019 13:01:15 [name] [name] ([id number]) -------------------------------------------------------------------------------- Physician Orders Details Patient Name: [name] [name], [name] [name]. Date of Service: 07/26/2019 12:45 PM Medical Record Number: [id number] Patient Account Number: [id number] Date of Birth/Sex: 10/14/1945 (74 y.o. F) Treating RN: Army Melia Primary Care Provider: Tracie Harrier Other Clinician: Referring Provider: Tracie Harrier Treating Provider/Extender: Melburn Hake, Kirston Luty Weeks in Treatment: 1 Verbal / Phone Orders: No Diagnosis Coding ICD-10 Coding Code Description I87.2 Venous insufficiency (chronic) (peripheral) I89.0 Lymphedema, not elsewhere classified L97.822 Non-pressure chronic ulcer of other part of left lower leg with fat layer exposed I10 Essential (primary) hypertension Wound Cleansing Wound #1 Left,Medial Lower Leg o Clean wound with Normal Saline. - in office o Cleanse wound with mild soap and water Primary Wound Dressing Wound #1 Left,Medial Lower Leg o Silver Alginate Secondary Dressing Wound #1 Left,Medial Lower Leg o ABD and Kerlix/Conform Dressing Change Frequency Wound #1 Left,Medial Lower Leg o Change dressing every day. Follow-up Appointments Wound #1 Left,Medial Lower Leg o Return Appointment in 1 week. Edema Control Wound #1 Left,Medial Lower Leg o Other: - Tubi grip Patient Medications Allergies: No Known Drug Allergies Notifications Medication Indication Start End Bactrim DS 07/26/2019 DOSE 1 - oral 800 mg-160 mg tablet - 1 tablet oral taken 2 times per day for 10 days Electronic Signature(s) Signed: 07/26/2019 1:02:14 PM By: Worthy Keeler PA-C Entered By: Worthy Keeler on 07/26/2019  13:02:13 Tennant, [name] [name] ([id number]) -------------------------------------------------------------------------------- Problem List Details Patient Name: [name] [name]. Date of Service: 07/26/2019 12:45 PM Medical Record Number: [id number] Patient Account Number: [id number] Date of Birth/Sex: [birth year] (74 y.o. F) Treating RN: Army Melia Primary Care Provider: Tracie Harrier Other Clinician: Referring Provider: Tracie Harrier Treating Provider/Extender: Melburn Hake, Baylei Siebels Weeks in Treatment: 1 Active Problems ICD-10 Encounter Code Description Active Date MDM Diagnosis I87.2 Venous insufficiency (chronic) (peripheral) 07/19/2019 No Yes I89.0 Lymphedema, not elsewhere classified 07/19/2019 No Yes L97.822 Non-pressure chronic ulcer of other part of left lower leg with fat layer 07/19/2019 No Yes exposed [place] (primary) hypertension 07/19/2019 No Yes Inactive Problems Resolved Problems Electronic Signature(s) Signed: 07/26/2019 12:52:48 PM By: Worthy Keeler PA-C Entered By: Worthy Keeler on 07/26/2019 12:52:46 [name] [name], [name] [name] ([id number]) -------------------------------------------------------------------------------- Progress Note Details Patient Name: [name] [name]. Date of Service: 07/26/2019 12:45 PM Medical Record Number: [id number] Patient Account Number: [id number] Date of Birth/Sex: [birth year] (74 y.o. F) Treating RN: Army Melia Primary Care Provider: Tracie Harrier Other Clinician: Referring Provider: Tracie Harrier Treating Provider/Extender: Melburn Hake, Leah Skora Weeks in Treatment: 1 Subjective Chief Complaint Information obtained from Patient Left LE Ulcer History of Present Illness (HPI) 07/19/2019 upon evaluation today patient presents for initial inspection here in our clinic concerning issues that she has been having with her left lower extremity and she tells me this has been about 2 months. Dr. Ginette Pitman referred her to [place] due to the fact that this has been  going on so long. With that being said it appears that he has been managing this quite well in my opinion. The patient's been using a little antibiotic ointment on the area followed by actually a folded up paper towel as she states the gauze actually bothered her. Subsequently she has been using Covan to hold this in place. Fortunately there is no signs of active infection at this time. No fevers, chills, nausea, vomiting, or diarrhea. The patient is a prior smoker she quit 5 years ago. She also has a history of hypertension and COPD she  is on oxygen supplementally all the time. 07/26/2019 on evaluation today patient appears to be doing well in general with regard to healing concerning her wound. She has been tolerating the dressing changes without complication. Fortunately there is no signs of active infection at this time. No fevers, chills, nausea, vomiting, or diarrhea. In general I really am pleased with how things seem to be progressing. However the patient though she does appear to be for the most part healed has some pustules around noted at this point which have me concerned about the possibility of infection I believe she may need an antibiotic to help clear this up. Objective Constitutional Well-nourished and well-hydrated in no acute distress. Vitals Time Taken: 12:48 PM, Height: 67 in, Weight: 194 lbs, BMI: 30.4, Temperature: 97.7 F, Pulse: 111 bpm, Respiratory Rate: 18 breaths/min, Blood Pressure: 142/76 mmHg. Respiratory normal breathing without difficulty. Psychiatric this patient is able to make decisions and demonstrates good insight into disease process. Alert and Oriented x 3. pleasant and cooperative. General Notes: Upon inspection patient's wound again showed signs of pretty much being completely sealed although she does have several pustules scattered throughout the area that has me concerned about infection. There really is not a good area to culture from therefore no  culture was obtained. I am going to place her on oral antibiotic however. I want to get this under control before it causes any worsening. Integumentary (Hair, Skin) Wound #1 status is Open. Original cause of wound was Gradually Appeared. The wound is located on the Left,Medial Lower Leg. The wound measures 0cm length x 0cm width x 0cm depth; 0cm^2 area and 0cm^3 volume. The wound is limited to skin breakdown. There is no tunneling or undermining noted. There is a none present amount of drainage noted. The wound margin is flat and intact. There is no granulation within the wound bed. There is no necrotic tissue within the wound bed. Assessment Active Problems ICD-10 Venous insufficiency (chronic) (peripheral) [name], [name] ([id number]) Lymphedema, not elsewhere classified Non-pressure chronic ulcer of other part of left lower leg with fat layer exposed Essential (primary) hypertension Plan Wound Cleansing: Wound #1 Left,Medial Lower Leg: Clean wound with Normal Saline. - in office Cleanse wound with mild soap and water Primary Wound Dressing: Wound #1 Left,Medial Lower Leg: Silver Alginate Secondary Dressing: Wound #1 Left,Medial Lower Leg: ABD and Kerlix/Conform Dressing Change Frequency: Wound #1 Left,Medial Lower Leg: Change dressing every day. Follow-up Appointments: Wound #1 Left,Medial Lower Leg: Return Appointment in 1 week. Edema Control: Wound #1 Left,Medial Lower Leg: Other: - Tubi grip The following medication(s) was prescribed: Bactrim DS oral 800 mg-160 mg tablet 1 1 tablet oral taken 2 times per day for 10 days starting 07/26/2019 1. My suggestion at this time is can be that we go ahead and initiate treatment with Bactrim DS. This should cover well for MRSA as well as staph in general as well as other organisms likely to be the cause of the current situation. 2. Rec she can use a silver alginate dressing just to keep the area clean and dry. 3. We will continue  with the Tubigrip which also seems to be doing well for her. We will see patient back for reevaluation in 1 week here in the clinic. If anything worsens or changes patient will contact our office for additional recommendations. Electronic Signature(s) Signed: 07/26/2019 1:02:38 PM By: Lenda Kelp PA-C Entered By: Lenda Kelp on 07/26/2019 13:02:36 [name] [name], [name] [name] ([id number]) -------------------------------------------------------------------------------- SuperBill Details Patient Name:  [name] [name], [name] Date of Service: 07/26/2019 Medical Record Number: [id number] Patient Account Number: [id number] Date of Birth/Sex: Jul 24, 1945 (74 y.o. F) Treating RN: Rodell Perna Primary Care Provider: Barbette Reichmann Other Clinician: Referring Provider: Barbette Reichmann Treating Provider/Extender: Linwood Dibbles, Bich Mchaney Weeks in Treatment: 1 Diagnosis Coding ICD-10 Codes Code Description I87.2 Venous insufficiency (chronic) (peripheral) I89.0 Lymphedema, not elsewhere classified L97.822 Non-pressure chronic ulcer of other part of left lower leg with fat layer exposed I10 Essential (primary) hypertension Facility Procedures CPT4 Code: [id number] Description: 99213 - WOUND CARE VISIT-LEV 3 EST PT Modifier: Quantity: 1 Physician Procedures CPT4 Code: 8676195 Description: 99214 - WC PHYS LEVEL 4 - EST PT Modifier: Quantity: 1 CPT4 Code: Description: ICD-10 Diagnosis Description I87.2 Venous insufficiency (chronic) (peripheral) I89.0 Lymphedema, not elsewhere classified L97.822 Non-pressure chronic ulcer of other part of left lower leg with fat lay I10 Essential (primary) hypertension Modifier: er exposed Quantity: Electronic Signature(s) Signed: 07/26/2019 1:02:52 PM By: Lenda Kelp PA-C Entered By: Lenda Kelp on 07/26/2019 13:02:51

## 2019-08-02 ENCOUNTER — Encounter: Payer: Medicare Other | Admitting: Physician Assistant

## 2019-08-05 ENCOUNTER — Encounter: Payer: Medicare Other | Admitting: Internal Medicine

## 2019-08-05 ENCOUNTER — Other Ambulatory Visit: Payer: Self-pay

## 2019-08-05 DIAGNOSIS — I872 Venous insufficiency (chronic) (peripheral): Secondary | ICD-10-CM | POA: Diagnosis not present

## 2019-08-06 NOTE — Progress Notes (Signed)
TRIA, NOGUERA ([id number]) Visit Report for 08/05/2019 HPI Details Patient Name: [name] [name], [name] [name]. Date of Service: 08/05/2019 3:15 PM Medical Record Number: [id number] Patient Account Number: [id number] Date of Birth/Sex: 29-Mar-1945 (74 y.o. F) Treating RN: Army Melia Primary Care Provider: Tracie Harrier Other Clinician: Referring Provider: Tracie Harrier Treating Provider/Extender: Tito Dine in Treatment: 2 History of Present Illness HPI Description: 07/19/2019 upon evaluation today patient presents for initial inspection here in our clinic concerning issues that she has been having with her left lower extremity and she tells me this has been about 2 months. Dr. Ginette Pitman referred her to [place] due to the fact that this has been going on so long. With that being said it appears that he has been managing this quite well in my opinion. The patient's been using a little antibiotic ointment on the area followed by actually a folded up paper towel as she states the gauze actually bothered her. Subsequently she has been using Covan to hold this in place. Fortunately there is no signs of active infection at this time. No fevers, chills, nausea, vomiting, or diarrhea. The patient is a prior smoker she quit 5 years ago. She also has a history of hypertension and COPD she is on oxygen supplementally all the time. 07/26/2019 on evaluation today patient appears to be doing well in general with regard to healing concerning her wound. She has been tolerating the dressing changes without complication. Fortunately there is no signs of active infection at this time. No fevers, chills, nausea, vomiting, or diarrhea. In general I really am pleased with how things seem to be progressing. However the patient though she does appear to be for the most part healed has some pustules around noted at this point which have me concerned about the possibility of infection I believe she may need an antibiotic to  help clear this up. 5/20; the patient's wound on the medial part of her left leg is completely healed. She does not have medical grade compression stockings she does have support hose although I do not think this is going to be sufficient. Electronic Signature(s) Signed: 08/05/2019 5:46:06 PM By: Linton Ham MD Entered By: Linton Ham on 08/05/2019 16:06:39 Lamay, Clarene Duke ([id number]) -------------------------------------------------------------------------------- Physical Exam Details Patient Name: [name] [name]. Date of Service: 08/05/2019 3:15 PM Medical Record Number: [id number] Patient Account Number: [id number] Date of Birth/Sex: [birth year] (74 y.o. F) Treating RN: Army Melia Primary Care Provider: Tracie Harrier Other Clinician: Referring Provider: Tracie Harrier Treating Provider/Extender: Ricard Dillon Weeks in Treatment: 2 Respiratory Patient has O2 but no respiratory distress. Cardiovascular Pedal pulses are palpable on the left. Poorly controlled nonpitting edema. Psychiatric No evidence of depression, anxiety, or agitation. Calm, cooperative, and communicative. Appropriate interactions and affect.. Notes Wound exam; the area is completely healed. She has scarred areas from previous wounds question abscesses. No evidence of infection Electronic Signature(s) Signed: 08/05/2019 5:46:06 PM By: Linton Ham MD Entered By: Linton Ham on 08/05/2019 16:08:40 Jelley, Clarene Duke ([id number]) -------------------------------------------------------------------------------- Physician Orders Details Patient Name: [name] [name]. Date of Service: 08/05/2019 3:15 PM Medical Record Number: [id number] Patient Account Number: [id number] Date of Birth/Sex: 1946/01/20 (74 y.o. F) Treating RN: Army Melia Primary Care Provider: Tracie Harrier Other Clinician: Referring Provider: Tracie Harrier Treating Provider/Extender: Tito Dine in Treatment:  2 Verbal / Phone Orders: No Diagnosis Coding Discharge From [facility] o Discharge from [facility] - treatment complete Electronic Signature(s) Signed: 08/05/2019 5:18:09 PM By: Army Melia Signed:  08/05/2019 5:46:06 PM By: Baltazar Najjar MD Entered By: Rodell Perna on 08/05/2019 15:48:08 Pew, Lavone Nian ([id number]) -------------------------------------------------------------------------------- Problem List Details Patient Name: [name] [name]. Date of Service: 08/05/2019 3:15 PM Medical Record Number: [id number] Patient Account Number: [id number] Date of Birth/Sex: 12-Oct-1945 (74 y.o. F) Treating RN: Rodell Perna Primary Care Provider: Barbette Reichmann Other Clinician: Referring Provider: Barbette Reichmann Treating Provider/Extender: Altamese [place] in Treatment: 2 Active Problems ICD-10 Encounter Code Description Active Date MDM Diagnosis I87.2 Venous insufficiency (chronic) (peripheral) 07/19/2019 No Yes I89.0 Lymphedema, not elsewhere classified 07/19/2019 No Yes L97.822 Non-pressure chronic ulcer of other part of left lower leg with fat layer 07/19/2019 No Yes exposed I10 Essential (primary) hypertension 07/19/2019 No Yes Inactive Problems Resolved Problems Electronic Signature(s) Signed: 08/05/2019 5:46:06 PM By: Baltazar Najjar MD Entered By: Baltazar Najjar on 08/05/2019 16:05:31 Maciejewski, Amika Rexene Edison ([id number]) -------------------------------------------------------------------------------- Progress Note Details Patient Name: [name] [name]. Date of Service: 08/05/2019 3:15 PM Medical Record Number: [id number] Patient Account Number: [id number] Date of Birth/Sex: [birth year] (74 y.o. F) Treating RN: Rodell Perna Primary Care Provider: Barbette Reichmann Other Clinician: Referring Provider: Barbette Reichmann Treating Provider/Extender: Altamese [place] in Treatment: 2 Subjective History of Present Illness (HPI) 07/19/2019 upon evaluation today patient  presents for initial inspection here in our clinic concerning issues that she has been having with her left lower extremity and she tells me this has been about 2 months. Dr. Marcello Fennel referred her to [place] due to the fact that this has been going on so long. With that being said it appears that he has been managing this quite well in my opinion. The patient's been using a little antibiotic ointment on the area followed by actually a folded up paper towel as she states the gauze actually bothered her. Subsequently she has been using Covan to hold this in place. Fortunately there is no signs of active infection at this time. No fevers, chills, nausea, vomiting, or diarrhea. The patient is a prior smoker she quit 5 years ago. She also has a history of hypertension and COPD she is on oxygen supplementally all the time. 07/26/2019 on evaluation today patient appears to be doing well in general with regard to healing concerning her wound. She has been tolerating the dressing changes without complication. Fortunately there is no signs of active infection at this time. No fevers, chills, nausea, vomiting, or diarrhea. In general I really am pleased with how things seem to be progressing. However the patient though she does appear to be for the most part healed has some pustules around noted at this point which have me concerned about the possibility of infection I believe she may need an antibiotic to help clear this up. 5/20; the patient's wound on the medial part of her left leg is completely healed. She does not have medical grade compression stockings she does have support hose although I do not think this is going to be sufficient. Objective Constitutional Vitals Time Taken: 3:15 PM, Height: 67 in, Weight: 194 lbs, BMI: 30.4, Temperature: 98.3 F, Pulse: 128 bpm, Respiratory Rate: 18 breaths/min, Blood Pressure: 135/65 mmHg. Respiratory Patient has O2 but no respiratory distress. Cardiovascular Pedal  pulses are palpable on the left. Poorly controlled nonpitting edema. Psychiatric No evidence of depression, anxiety, or agitation. Calm, cooperative, and communicative. Appropriate interactions and affect.. General Notes: Wound exam; the area is completely healed. She has scarred areas from previous wounds question abscesses. No evidence of infection Integumentary (Hair, Skin) Wound #1 status is Open.  Original cause of wound was Gradually Appeared. The wound is located on the Left,Medial Lower Leg. The wound measures 0cm length x 0cm width x 0cm depth; 0cm^2 area and 0cm^3 volume. The wound is limited to skin breakdown. There is a none present amount of drainage noted. The wound margin is flat and intact. There is no granulation within the wound bed. There is no necrotic tissue within the wound bed. Assessment Active Problems ICD-10 Venous insufficiency (chronic) (peripheral) Lymphedema, not elsewhere classified Non-pressure chronic ulcer of other part of left lower leg with fat layer exposed Essential (primary) hypertension ZEA, KOSTKA ([id number]) Plan Discharge From [facility]: Discharge from Wound Care Center - treatment complete 1. The patient can be discharged from the wound care center 2. We gave her measurements and instructions to call elastic therapy in [place] for 20/30 below-knee great compression stockings. I told her I did not think that support hose would suffice for her degree of edema 3. Also reminded her to lubricate the skin in her lower extremities nightly Electronic Signature(s) Signed: 08/05/2019 5:46:06 PM By: Baltazar Najjar MD Entered By: Baltazar Najjar on 08/05/2019 16:10:20 Noa, Lavone Nian ([id number]) -------------------------------------------------------------------------------- SuperBill Details Patient Name: [name] [name]. Date of Service: 08/05/2019 Medical Record Number: [id number] Patient Account Number: [id number] Date of Birth/Sex: 01-04-1946 (74  y.o. F) Treating RN: Rodell Perna Primary Care Provider: Barbette Reichmann Other Clinician: Referring Provider: Barbette Reichmann Treating Provider/Extender: Altamese  in Treatment: 2 Diagnosis Coding ICD-10 Codes Code Description I87.2 Venous insufficiency (chronic) (peripheral) I89.0 Lymphedema, not elsewhere classified L97.822 Non-pressure chronic ulcer of other part of left lower leg with fat layer exposed I10 Essential (primary) hypertension Facility Procedures CPT4 Code: [id number] Description: 99213 - WOUND CARE VISIT-LEV 3 EST PT Modifier: Quantity: 1 Physician Procedures CPT4 Code: [id number] Description: 99213 - WC PHYS LEVEL 3 - EST PT Modifier: Quantity: 1 CPT4 Code: Description: ICD-10 Diagnosis Description L97.822 Non-pressure chronic ulcer of other part of left lower leg with fat lay I89.0 Lymphedema, not elsewhere classified I87.2 Venous insufficiency (chronic) (peripheral) Modifier: er exposed Quantity: Electronic Signature(s) Signed: 08/05/2019 5:46:06 PM By: Baltazar Najjar MD Entered By: Baltazar Najjar on 08/05/2019 16:10:42

## 2019-08-06 NOTE — Progress Notes (Signed)
[name] [name], [name] [name] ([id number]) Visit Report for 08/05/2019 Arrival Information Details Patient Name: [name] [name], [name] [name]. Date of Service: 08/05/2019 3:15 PM Medical Record Number: [id number] Patient Account Number: [id number] Date of Birth/Sex: [birth year] (74 y.o. F) Treating RN: Rodell Perna Primary Care [name] [name]: [name] [name] Other Clinician: Referring [name] [name]: [name] [name] Treating Deavion Strider/Extender: Altamese [place] in Treatment: 2 Visit Information History Since Last Visit Added or deleted any medications: No Patient Arrived: Ambulatory Any new allergies or adverse reactions: No Arrival Time: 15:18 Had a fall or experienced change in No Accompanied By: self activities of daily living that may affect Transfer Assistance: None risk of falls: Patient Identification Verified: Yes Signs or symptoms of abuse/neglect since last visito No Secondary Verification Process Completed: Yes Hospitalized since last visit: No Implantable device outside of the clinic excluding No cellular tissue based products placed in the center since last visit: Has Dressing in Place as Prescribed: Yes Pain Present Now: No Electronic Signature(s) Signed: 08/05/2019 5:33:40 PM By: Dayton Martes RCP, RRT, CHT Entered By: Dayton Martes on 08/05/2019 15:19:06 [name] [name], [name] [name] ([id number]) -------------------------------------------------------------------------------- Clinic Level of Care Assessment Details Patient Name: [name] [name]. Date of Service: 08/05/2019 3:15 PM Medical Record Number: [id number] Patient Account Number: [id number] Date of Birth/Sex: 26-Mar-1945 (74 y.o. F) Treating RN: Rodell Perna Primary Care [name] [name]: [name] [name] Other Clinician: Referring [name] [name]: [name] [name] Treating Bridger Pizzi/Extender: Altamese [place] in Treatment: 2 Clinic Level of Care Assessment Items TOOL 4 Quantity Score []  - Use when only an EandM is performed on  FOLLOW-UP visit 0 ASSESSMENTS - Nursing Assessment / Reassessment X - Reassessment of Co-morbidities (includes updates in patient status) 1 10 X- 1 5 Reassessment of Adherence to Treatment Plan ASSESSMENTS - Wound and Skin Assessment / Reassessment X - Simple Wound Assessment / Reassessment - one wound 1 5 []  - 0 Complex Wound Assessment / Reassessment - multiple wounds []  - 0 Dermatologic / Skin Assessment (not related to wound area) ASSESSMENTS - Focused Assessment X - Circumferential Edema Measurements - multi extremities 1 5 []  - 0 Nutritional Assessment / Counseling / Intervention X- 1 5 Lower Extremity Assessment (monofilament, tuning fork, pulses) []  - 0 Peripheral Arterial Disease Assessment (using hand held doppler) ASSESSMENTS - Ostomy and/or Continence Assessment and Care []  - Incontinence Assessment and Management 0 []  - 0 Ostomy Care Assessment and Management (repouching, etc.) PROCESS - Coordination of Care X - Simple Patient / Family Education for ongoing care 1 15 []  - 0 Complex (extensive) Patient / Family Education for ongoing care []  - 0 Staff obtains , Records, Test Results / Process Orders []  - 0 Staff telephones HHA, Nursing Homes / Clarify orders / etc []  - 0 Routine Transfer to another Facility (non-emergent condition) []  - 0 Routine Hospital Admission (non-emergent condition) []  - 0 New Admissions / / Ordering NPWT, Apligraf, etc. []  - 0 Emergency Hospital Admission (emergent condition) X- 1 10 Simple Discharge Coordination []  - 0 Complex (extensive) Discharge Coordination PROCESS - Special Needs []  - Pediatric / Minor Patient Management 0 []  - 0 Isolation Patient Management []  - 0 Hearing / Language / Visual special needs []  - 0 Assessment of Community assistance (transportation, D/C planning, etc.) []  - 0 Additional assistance / Altered mentation []  - 0 Support Surface(s) Assessment (bed, cushion, seat,  etc.) INTERVENTIONS - Wound Cleansing / Measurement [name], [name] ( ) X- 1 5 Simple Wound Cleansing - one wound []  - 0 Complex Wound Cleansing - multiple wounds  X- 1 5 Wound Imaging (photographs - any number of wounds) []  - 0 Wound Tracing (instead of photographs) X- 1 5 Simple Wound Measurement - one wound []  - 0 Complex Wound Measurement - multiple wounds INTERVENTIONS - Wound Dressings X - Small Wound Dressing one or multiple wounds 1 10 []  - 0 Medium Wound Dressing one or multiple wounds []  - 0 Large Wound Dressing one or multiple wounds []  - 0 Application of Medications - topical []  - 0 Application of Medications - injection INTERVENTIONS - Miscellaneous []  - External ear exam 0 []  - 0 Specimen Collection (cultures, biopsies, blood, body fluids, etc.) []  - 0 Specimen(s) / Culture(s) sent or taken to Lab for analysis []  - 0 Patient Transfer (multiple staff / / Similar devices) []  - 0 Simple Staple / Suture removal (25 or less) []  - 0 Complex Staple / Suture removal (26 or more) []  - 0 Hypo / Hyperglycemic Management (close monitor of Blood Glucose) []  - 0 Ankle / Brachial Index (ABI) - do not check if billed separately X- 1 5 Vital Signs Has the patient been seen at the hospital within the last three years: Yes Total Score: 85 Level Of Care: New/Established - Level 3 Electronic Signature(s) Signed: 08/05/2019 5:18:09 PM By: Entered By: on 08/05/2019 15:48:51 [name] [name], [name] [name] ( ) -------------------------------------------------------------------------------- Encounter Discharge Information Details Patient Name: . Date of Service: 08/05/2019 3:15 PM Medical Record Number: Patient Account Number: Nurse, adult Date of Birth/Sex: [birth year] (74 y.o. F) Treating RN: Primary Care [name] [name]: Other Clinician: Referring [name] [name]: 08/07/2019 Treating  [name] [name]/Extender: Rodell Perna in Treatment: 2 Encounter Discharge Information Items Discharge Condition: Stable Ambulatory Status: Ambulatory Discharge Destination: Home Transportation: Private Auto Accompanied By: self Schedule Follow-up Appointment: Yes Clinical Summary of Care: Electronic Signature(s) Signed: 08/05/2019 5:18:09 PM By: 08/07/2019 Entered By: Rexene [name] on 08/05/2019 15:53:12 [name] [name], [name] [name] (08/07/2019) -------------------------------------------------------------------------------- Lower Extremity Assessment Details Patient Name: [id number] H. Date of Service: 08/05/2019 3:15 PM Medical Record Number: 04/06/1945 Patient Account Number: (66 Date of Birth/Sex: 05-27-1945 (74 y.o. F) Treating RN: [name] [name] Primary Care Szymon Foiles: Altamese [place] Other Clinician: Referring Charnise Lovan: 08/07/2019 Treating Itati Brocksmith/Extender: Rodell Perna Weeks in Treatment: 2 Edema Assessment Assessed: [Left: No] [Right: No] Edema: [Left: Ye] [Right: s] Vascular Assessment Pulses: Dorsalis Pedis Palpable: [Left:Yes] Electronic Signature(s) Signed: 08/05/2019 5:37:23 PM By: 08/07/2019 Entered By: Rexene [name] on 08/05/2019 15:24:51 [name] [name], [name] [name] (08/07/2019) -------------------------------------------------------------------------------- Multi Wound Chart Details Patient Name: [id number]. Date of Service: 08/05/2019 3:15 PM Medical Record Number: 04/06/1945 Patient Account Number: (66 Date of Birth/Sex: November 08, 1945 (74 y.o. F) Treating RN: [name] [name] Primary Care Hobson Lax: Maxwell Caul Other Clinician: Referring Narcissa Melder: 08/07/2019 Treating Haedyn Ancrum/Extender: Curtis [place] in Treatment: 2 Vital Signs Height(in): 67 Pulse(bpm): 128 Weight(lbs): 194 Blood Pressure(mmHg): 135/65 Body Mass Index(BMI): 30 Temperature(F): 98.3 Respiratory Rate(breaths/min): 18 Photos: [N/A:N/A] Wound Location: Left,  Medial Lower Leg N/A N/A Wounding Event: Gradually Appeared N/A N/A Primary Etiology: Venous Leg Ulcer N/A N/A Comorbid History: Chronic Obstructive Pulmonary N/A N/A Disease (COPD), Hypertension Date Acquired: 06/17/2019 N/A N/A Weeks of Treatment: 2 N/A N/A Wound Status: Open N/A N/A Measurements [name] x W x D (cm) 0x0x0 N/A N/A Area (cm) : 0 N/A N/A Volume (cm) : 0 N/A N/A % Reduction in Area: 100.00% N/A N/A % Reduction in Volume: 100.00% N/A N/A Classification: Full Thickness Without Exposed N/A N/A Support Structures Exudate  Amount: None Present N/A N/A Wound Margin: Flat and Intact N/A N/A Granulation Amount: None Present (0%) N/A N/A Necrotic Amount: None Present (0%) N/A N/A Exposed Structures: Fascia: No N/A N/A Fat Layer (Subcutaneous Tissue) Exposed: No Tendon: No Muscle: No Joint: No Bone: No Limited to Skin Breakdown Epithelialization: Large (67-100%) N/A N/A Treatment Notes Electronic Signature(s) Signed: 08/05/2019 5:46:06 PM By: Linton Ham MD Entered By: Linton Ham on 08/05/2019 16:05:45 [name] [name], [name] [name] ([id number]) -------------------------------------------------------------------------------- Multi-Disciplinary Care Plan Details Patient Name: [name] [name]. Date of Service: 08/05/2019 3:15 PM Medical Record Number: [id number] Patient Account Number: [id number] Date of Birth/Sex: 1945-04-19 (74 y.o. F) Treating RN: Army Melia Primary Care Hollis Oh: Tracie Harrier Other Clinician: Referring Everley Evora: Tracie Harrier Treating Josecarlos Harriott/Extender: Tito Dine in Treatment: 2 Active Inactive Electronic Signature(s) Signed: 08/05/2019 5:18:09 PM By: Army Melia Entered By: Army Melia on 08/05/2019 15:47:41 [name] [name], [name] [name] ([id number]) -------------------------------------------------------------------------------- Pain Assessment Details Patient Name: [name] [name]. Date of Service: 08/05/2019 3:15 PM Medical Record Number:  [id number] Patient Account Number: [id number] Date of Birth/Sex: 1945/04/08 (74 y.o. F) Treating RN: Montey Hora Primary Care Dwan Hemmelgarn: Tracie Harrier Other Clinician: Referring Jeronda Don: Tracie Harrier Treating Khadija Thier/Extender: Tito Dine in Treatment: 2 Active Problems Location of Pain Severity and Description of Pain Patient Has Paino No Site Locations Pain Management and Medication Current Pain Management: Electronic Signature(s) Signed: 08/05/2019 5:37:23 PM By: Montey Hora Entered By: Montey Hora on 08/05/2019 15:23:43 [name] [name], [name] [name] ([id number]) -------------------------------------------------------------------------------- Patient/Caregiver Education Details Patient Name: [name] [name]. Date of Service: 08/05/2019 3:15 PM Medical Record Number: [id number] Patient Account Number: [id number] Date of Birth/Gender: 1945/06/23 (74 y.o. F) Treating RN: Army Melia Primary Care Physician: Tracie Harrier Other Clinician: Referring Physician: Tracie Harrier Treating Physician/Extender: Tito Dine in Treatment: 2 Education Assessment Education Provided To: Patient Education Topics Provided Wound/Skin Impairment: Handouts: Caring for Your Ulcer Methods: Demonstration, Explain/Verbal Responses: State content correctly Electronic Signature(s) Signed: 08/05/2019 5:18:09 PM By: Army Melia Entered By: Army Melia on 08/05/2019 15:49:23 [name] [name], [name] Lemmie [name] ([id number]) -------------------------------------------------------------------------------- Wound Assessment Details Patient Name: Barbaraann Faster H. Date of Service: 08/05/2019 3:15 PM Medical Record Number: [id number] Patient Account Number: [id number] Date of Birth/Sex: 1945-07-26 (74 y.o. F) Treating RN: Montey Hora Primary Care Lexxi Koslow: Tracie Harrier Other Clinician: Referring Salahuddin Arismendez: Tracie Harrier Treating Krisanne Lich/Extender: Tito Dine in Treatment:  2 Wound Status Wound Number: 1 Primary Venous Leg Ulcer Etiology: Wound Location: Left, Medial Lower Leg Wound Status: Open Wounding Event: Gradually Appeared Comorbid Chronic Obstructive Pulmonary Disease (COPD), Date Acquired: 06/17/2019 History: Hypertension Weeks Of Treatment: 2 Clustered Wound: No Photos Wound Measurements Length: (cm) 0 Width: (cm) 0 Depth: (cm) 0 Area: (cm) 0 Volume: (cm) 0 % Reduction in Area: 100% % Reduction in Volume: 100% Epithelialization: Large (67-100%) Wound Description Classification: Full Thickness Without Exposed Support Structure Wound Margin: Flat and Intact Exudate Amount: None Present s Foul Odor After Cleansing: No Slough/Fibrino No Wound Bed Granulation Amount: None Present (0%) Exposed Structure Necrotic Amount: None Present (0%) Fascia Exposed: No Fat Layer (Subcutaneous Tissue) Exposed: No Tendon Exposed: No Muscle Exposed: No Joint Exposed: No Bone Exposed: No Limited to Skin Breakdown Electronic Signature(s) Signed: 08/05/2019 5:37:23 PM By: Montey Hora Entered By: Montey Hora on 08/05/2019 15:24:36 Bailey, Geral Lemmie [name] ([id number]) -------------------------------------------------------------------------------- [place] Details Patient Name: [name] [name]. Date of Service: 08/05/2019 3:15 PM Medical Record Number: [id number] Patient Account Number: [id number] Date of Birth/Sex: Nov 18, 1945 (74 y.o. F) Treating RN: Army Melia Primary Care Joyia Riehle: Tracie Harrier  Other Clinician: Referring Anay Walter: [name] [name] Treating Emme Rosenau/Extender: Altamese [place] [name] in Treatment: 2 Vital Signs Time Taken: 15:15 Temperature (F): 98.3 Height (in): 67 Pulse (bpm): 128 Weight (lbs): 194 Respiratory Rate (breaths/min): 18 Body Mass Index (BMI): 30.4 Blood Pressure (mmHg): 135/65 Reference Range: 80 - 120 mg / dl Electronic Signature(s) Signed: 08/05/2019 5:33:40 PM By: Dayton Martes RCP, RRT,  CHT Entered By: Dayton Martes on 08/05/2019 15:19:33

## 2019-08-11 ENCOUNTER — Emergency Department
Admission: EM | Admit: 2019-08-11 | Discharge: 2019-08-12 | Disposition: A | Discharge status: Home / Self Care | Payer: Medicare Other | Attending: Emergency Medicine | Admitting: Emergency Medicine

## 2019-08-11 ENCOUNTER — Other Ambulatory Visit: Payer: Self-pay

## 2019-08-11 DIAGNOSIS — Z87891 Personal history of nicotine dependence: Secondary | ICD-10-CM | POA: Diagnosis not present

## 2019-08-11 DIAGNOSIS — M5441 Lumbago with sciatica, right side: Secondary | ICD-10-CM | POA: Insufficient documentation

## 2019-08-11 DIAGNOSIS — J449 Chronic obstructive pulmonary disease, unspecified: Secondary | ICD-10-CM | POA: Diagnosis not present

## 2019-08-11 DIAGNOSIS — I10 Essential (primary) hypertension: Secondary | ICD-10-CM | POA: Insufficient documentation

## 2019-08-11 DIAGNOSIS — Z79899 Other long term (current) drug therapy: Secondary | ICD-10-CM | POA: Insufficient documentation

## 2019-08-11 DIAGNOSIS — M545 Low back pain: Secondary | ICD-10-CM | POA: Diagnosis present

## 2019-08-11 DIAGNOSIS — M5431 Sciatica, right side: Secondary | ICD-10-CM

## 2019-08-11 MED ORDER — IBUPROFEN 800 MG PO TABS
800.0000 mg | ORAL_TABLET | Freq: Once | ORAL | Status: AC
  Administered 2019-08-11: 800 mg via ORAL
  Filled 2019-08-11: qty 1

## 2019-08-11 MED ORDER — METHOCARBAMOL 500 MG PO TABS: 500.0000 mg | ORAL_TABLET | Freq: Three times a day (TID) | ORAL | 0 refills | Status: DC | PRN

## 2019-08-11 MED ORDER — DEXAMETHASONE SODIUM PHOSPHATE 10 MG/ML IJ SOLN
10.0000 mg | Freq: Once | INTRAMUSCULAR | Status: AC
  Administered 2019-08-11: 10 mg via INTRAMUSCULAR
  Filled 2019-08-11: qty 1

## 2019-08-11 MED ORDER — ORPHENADRINE CITRATE 30 MG/ML IJ SOLN
30.0000 mg | Freq: Once | INTRAMUSCULAR | Status: AC
  Administered 2019-08-11: 30 mg via INTRAMUSCULAR
  Filled 2019-08-11: qty 2

## 2019-08-11 MED ORDER — PREDNISONE 10 MG PO TABS: 10.0000 mg | ORAL_TABLET | Freq: Every day | ORAL | 0 refills | Status: DC

## 2019-08-11 NOTE — ED Triage Notes (Signed)
Pt brought in by EMS with co lower back pain for few months. States has see pmd for the same but has not had any imaging. Has taken muscle relaxants without relief. Today became worse where she had slide herself down to the ground. She does not have injuries from fall, states here for chronic back pain eval. Pt states icy hot does help.

## 2019-08-11 NOTE — ED Provider Notes (Signed)
[facility] Emergency Department Provider Note  ____________________________________________  Time seen: Approximately 11:42 PM  I have reviewed the triage vital signs and the nursing notes.   HISTORY  Chief Complaint Back Pain    HPI [name] [name] is a 74 y.o. female who presents the emergency department via EMS complaining of lower back pain.  Patient states that she has been having an ongoing issue with her back, has been seen by her primary care provider multiple times for this complaint.  Patient states that today she was bending over, felt a popping sensation.  She states this "surprised her, and with the pain she "crumbled" into the floor.  Patient states that this was not a forceful fall.  She did not hit her head or lose consciousness.  She denied any other musculoskeletal injury.  She states that she is having her baseline pain in her lumbar spine currently with no increased pain.  No bowel or bladder dysfunction, saddle anesthesia or paresthesias.  Patient states that it is located on the right side of her back and runs into her buttocks.  Patient has had cortisone injections into her hips for bursitis which helped, but has never had an injection into her spine.  No medications prior to arrival.         Past Medical History:  Diagnosis Date  . COPD (chronic obstructive pulmonary disease) (HCC)   . Hypertension     Patient Active Problem List   Diagnosis Date Noted  . Symptomatic anemia 10/28/2017  . GI bleed 10/28/2017  . COPD exacerbation (HCC) 09/04/2014  . Acute respiratory failure with hypercapnia (HCC) 09/04/2014    Past Surgical History:  Procedure Laterality Date  . ABDOMINAL HYSTERECTOMY    . COLONOSCOPY N/A 10/30/2017   Procedure: COLONOSCOPY;  Surgeon: Toledo, Boykin Nearing, MD;  Location: [facility] ENDOSCOPY;  Service: Gastroenterology;  Laterality: N/A;  . ESOPHAGOGASTRODUODENOSCOPY N/A 10/30/2017   Procedure: ESOPHAGOGASTRODUODENOSCOPY  (EGD);  Surgeon: Toledo, Boykin Nearing, MD;  Location: [facility] ENDOSCOPY;  Service: Gastroenterology;  Laterality: N/A;    Prior to Admission medications   Medication Sig Start Date End Date Taking? Authorizing Provider  albuterol (PROVENTIL HFA;VENTOLIN HFA) 108 (90 Base) MCG/ACT inhaler Inhale 2 puffs into the lungs every 6 (six) hours as needed for wheezing or shortness of breath.    [provider]  albuterol (PROVENTIL) (2.5 MG/3ML) 0.083% nebulizer solution Inhale 3 mLs into the lungs every 6 (six) hours as needed for wheezing.     [provider]  cholecalciferol (VITAMIN D) 1000 units tablet Take 1,000 Units by mouth daily with lunch.    [provider]  docusate sodium (COLACE) 100 MG capsule Take 100 mg by mouth 2 (two) times daily as needed for mild constipation or moderate constipation.     [provider]  ferrous sulfate 325 (65 FE) MG tablet Take 1 tablet (325 mg total) by mouth 2 (two) times daily with a meal. 11/01/17   Gouru, Aruna, MD  Fluticasone-Salmeterol (ADVAIR) 500-50 MCG/DOSE AEPB Inhale 1 puff into the lungs every 12 (twelve) hours.    [provider]  methocarbamol (ROBAXIN) 500 MG tablet Take 1 tablet (500 mg total) by mouth every 8 (eight) hours as needed for muscle spasms. 08/11/19   Quinne Pires, Delorise Royals, PA-C  pantoprazole (PROTONIX) 40 MG tablet Take 1 tablet (40 mg total) by mouth 2 (two) times daily. 11/01/17   Gouru, Deanna Artis, MD  predniSONE (DELTASONE) 10 MG tablet Take 1 tablet (10 mg total)  by mouth daily. 08/11/19   Demaris Leavell, Charline Bills, PA-C  tiotropium (SPIRIVA) 18 MCG inhalation capsule Place 1 capsule (18 mcg total) into inhaler and inhale daily. Patient taking differently: Place 18 mcg into inhaler and inhale daily with lunch.  09/12/14   Tracie Harrier, MD  vitamin B-12 (CYANOCOBALAMIN) 1000 MCG tablet Take 1 tablet (1,000 mcg total) by mouth daily. 11/01/17   Nicholes Mango, MD  vitamin C (ASCORBIC ACID) 500 MG tablet  Take 500 mg by mouth daily.    [provider]    Allergies Patient has no known allergies.  Family History  Problem Relation Age of Onset  . Hypertension Mother   . Melanoma Father     Social History Social History   Tobacco Use  . Smoking status: Former Research scientist (life sciences)  . Smokeless tobacco: Never Used  Substance Use Topics  . Alcohol use: No  . Drug use: Never     Review of Systems  Constitutional: No fever/chills Eyes: No visual changes. No discharge ENT: No upper respiratory complaints. Cardiovascular: no chest pain. Respiratory: no cough. No SOB. Gastrointestinal: No abdominal pain.  No nausea, no vomiting.  No diarrhea.  No constipation. Genitourinary: Negative for dysuria. No hematuria Musculoskeletal: Positive for low back pain Skin: Negative for rash, abrasions, lacerations, ecchymosis. Neurological: Negative for headaches, focal weakness or numbness. 10-point ROS otherwise negative.  ____________________________________________   PHYSICAL EXAM:  VITAL SIGNS: ED Triage Vitals [08/11/19 2125]  Enc Vitals Group     BP 138/86     Pulse Rate (!) 108     Resp 20     Temp 98.5 F (36.9 C)     Temp Source Oral     SpO2 96 %     Weight 190 lb (86.2 kg)     Height 5\' 7"  (1.702 m)     Head Circumference      Peak Flow      Pain Score 8     Pain Loc      Pain Edu?      Excl. in [place]?      Constitutional: Alert and oriented. Well appearing and in no acute distress. Eyes: Conjunctivae are normal. PERRL. EOMI. Head: Atraumatic. ENT:      Ears:       Nose: No congestion/rhinnorhea.      Mouth/Throat: Mucous membranes are moist.  Neck: No stridor.  No cervical spine tenderness to palpation.  Cardiovascular: Normal rate, regular rhythm. Normal S1 and S2.  Good peripheral circulation. Respiratory: Normal respiratory effort without tachypnea or retractions. Lungs CTAB. Good air entry to the bases with no decreased or absent breath sounds. Gastrointestinal:  Bowel sounds 4 quadrants. Soft and nontender to palpation. No guarding or rigidity. No palpable masses. No distention. No CVA tenderness. Musculoskeletal: Full range of motion to all extremities. No gross deformities appreciated.  Visualization of the lumbar spine reveals no visible signs of trauma.  Good range of motion at this time.  No midline or left-sided tenderness.  Patient does have some mild right-sided paraspinal muscle tenderness extending into the right-sided sciatic notch.  Negative straight leg raise bilaterally.  Examination of the bilateral hips, lower extremities reveals no visible signs of trauma.  Good range of motion.  Dorsalis pedis pulses sensation intact and equal bilateral lower extremities. Neurologic:  Normal speech and language. No gross focal neurologic deficits are appreciated.  Skin:  Skin is warm, dry and intact. No rash noted. Psychiatric: Mood and affect are normal. Speech and behavior are  normal. Patient exhibits appropriate insight and judgement.   ____________________________________________   LABS (all labs ordered are listed, but only abnormal results are displayed)  Labs Reviewed - No data to display ____________________________________________  EKG   ____________________________________________  RADIOLOGY   No results found.  ____________________________________________    PROCEDURES  Procedure(s) performed:    Procedures    Medications  ibuprofen (ADVIL) tablet 800 mg (800 mg Oral Given 08/11/19 2129)  dexamethasone (DECADRON) injection 10 mg (10 mg Intramuscular Given 08/11/19 2359)  orphenadrine (NORFLEX) injection 30 mg (30 mg Intramuscular Given 08/11/19 2358)     ____________________________________________   INITIAL IMPRESSION / ASSESSMENT AND PLAN / ED COURSE  Pertinent labs & imaging results that were available during my care of the patient were reviewed by me and considered in my medical decision making (see chart for  details).  Review of the [place] CSRS was performed in accordance of the [facility] prior to dispensing any controlled drugs.           Patient's diagnosis is consistent with sciatica.  Patient presented to emergency department complaining of low back pain.  She states that she felt a popping sensation earlier today.  She states that this sensation caused her knees to buckle, and sliding into the floor.  Patient sustained no other injury.  She states that her back pain is at baseline right now.  No significant radicular symptoms that she does have some pain extending into the right-sided sciatic notch region.  No bowel bladder dysfunction, saddle anesthesia or paresthesias.  Exam was reassuring.  I discussed imaging versus treatment but as patient has a history of sciatica, is at her baseline, patient, her granddaughter, and I feel that at this time imaging is not necessary.  No labs at this time.  Patient was treated empirically with steroid and muscle relaxer.  Patient will follow up with her primary care for ongoing, chronic back issues.. Patient is given ED precautions to return to the ED for any worsening or new symptoms.     ____________________________________________  FINAL CLINICAL IMPRESSION(S) / ED DIAGNOSES  Final diagnoses:  Sciatica of right side      NEW MEDICATIONS STARTED DURING THIS VISIT:  ED Discharge Orders         Ordered    predniSONE (DELTASONE) 10 MG tablet  Daily    Note to Pharmacy: Take 6 pills x 2 days, 5 pills x 2 days, 4 pills x 2 days, 3 pills x 2 days, 2 pills x 2 days, and 1 pill x 2 days   08/11/19 2343    methocarbamol (ROBAXIN) 500 MG tablet  Every 8 hours PRN     08/11/19 2343              This chart was dictated using voice recognition software/Dragon. Despite best efforts to proofread, errors can occur which can change the meaning. Any change was purely unintentional.    Racheal Patches, PA-C 08/12/19 0027    Charlynne Pander,  MD 08/13/19 2107

## 2019-08-11 NOTE — ED Notes (Signed)
Pt reports "her back gave out" and she lowered herself to the ground. Reports no LOC, hitting head, or any other extremity. Reports no pain anywhere besides lumbar spine  Pt reports lumbar pain intermittently for 3 months, which she had been using icy hot with some relief. Pt reports steroid injection approx 1 month ago, with relief for approx 3 weeks.  Pt reports as of last week her pain has started to come back worse, with no relief from the icy hot. Pt reports taking muscle relaxers and tylenol for pain.   Pt reports pain is in a slightly different place now than before the steroid injection, describing it as in the middle of her lumbar spine   Pt reports generic advair due at 2300, and having missed her last dose of 5mg  prednisone

## 2021-02-02 ENCOUNTER — Emergency Department: Payer: Medicare Other

## 2021-02-02 ENCOUNTER — Encounter: Payer: Self-pay | Admitting: Emergency Medicine

## 2021-02-02 ENCOUNTER — Other Ambulatory Visit: Payer: Self-pay

## 2021-02-02 ENCOUNTER — Inpatient Hospital Stay
Admission: EM | Admit: 2021-02-02 | Discharge: 2021-02-04 | DRG: 177 | Disposition: A | Discharge status: Home / Self Care | Payer: Medicare Other | Source: Ambulatory Visit | Attending: Internal Medicine | Admitting: Internal Medicine

## 2021-02-02 DIAGNOSIS — Z9981 Dependence on supplemental oxygen: Secondary | ICD-10-CM

## 2021-02-02 DIAGNOSIS — E663 Overweight: Secondary | ICD-10-CM | POA: Diagnosis present

## 2021-02-02 DIAGNOSIS — Z28310 Unvaccinated for covid-19: Secondary | ICD-10-CM

## 2021-02-02 DIAGNOSIS — R739 Hyperglycemia, unspecified: Secondary | ICD-10-CM | POA: Diagnosis present

## 2021-02-02 DIAGNOSIS — R0902 Hypoxemia: Secondary | ICD-10-CM

## 2021-02-02 DIAGNOSIS — Z79899 Other long term (current) drug therapy: Secondary | ICD-10-CM | POA: Diagnosis not present

## 2021-02-02 DIAGNOSIS — Z6829 Body mass index (BMI) 29.0-29.9, adult: Secondary | ICD-10-CM

## 2021-02-02 DIAGNOSIS — Z7952 Long term (current) use of systemic steroids: Secondary | ICD-10-CM

## 2021-02-02 DIAGNOSIS — Z90710 Acquired absence of both cervix and uterus: Secondary | ICD-10-CM | POA: Diagnosis not present

## 2021-02-02 DIAGNOSIS — Z8249 Family history of ischemic heart disease and other diseases of the circulatory system: Secondary | ICD-10-CM | POA: Diagnosis not present

## 2021-02-02 DIAGNOSIS — Z7951 Long term (current) use of inhaled steroids: Secondary | ICD-10-CM

## 2021-02-02 DIAGNOSIS — J441 Chronic obstructive pulmonary disease with (acute) exacerbation: Secondary | ICD-10-CM | POA: Diagnosis present

## 2021-02-02 DIAGNOSIS — I10 Essential (primary) hypertension: Secondary | ICD-10-CM | POA: Diagnosis present

## 2021-02-02 DIAGNOSIS — J9602 Acute respiratory failure with hypercapnia: Secondary | ICD-10-CM | POA: Diagnosis not present

## 2021-02-02 DIAGNOSIS — J9622 Acute and chronic respiratory failure with hypercapnia: Secondary | ICD-10-CM | POA: Diagnosis present

## 2021-02-02 DIAGNOSIS — J449 Chronic obstructive pulmonary disease, unspecified: Secondary | ICD-10-CM | POA: Diagnosis present

## 2021-02-02 DIAGNOSIS — R42 Dizziness and giddiness: Secondary | ICD-10-CM

## 2021-02-02 DIAGNOSIS — J9621 Acute and chronic respiratory failure with hypoxia: Secondary | ICD-10-CM | POA: Diagnosis present

## 2021-02-02 DIAGNOSIS — R531 Weakness: Secondary | ICD-10-CM

## 2021-02-02 DIAGNOSIS — U071 COVID-19: Secondary | ICD-10-CM | POA: Diagnosis present

## 2021-02-02 LAB — URINALYSIS, ROUTINE W REFLEX MICROSCOPIC
Bilirubin Urine: NEGATIVE
Glucose, UA: NEGATIVE mg/dL
Hgb urine dipstick: NEGATIVE
Ketones, ur: 20 mg/dL — AB
Leukocytes,Ua: NEGATIVE
Nitrite: NEGATIVE
Protein, ur: 100 mg/dL — AB
Specific Gravity, Urine: 1.025 (ref 1.005–1.030)
WBC, UA: NONE SEEN WBC/hpf (ref 0–5)
pH: 5 (ref 5.0–8.0)

## 2021-02-02 LAB — CBG MONITORING, ED: Glucose-Capillary: 135 mg/dL — ABNORMAL HIGH (ref 70–99)

## 2021-02-02 LAB — BASIC METABOLIC PANEL
Anion gap: 12 (ref 5–15)
BUN: 19 mg/dL (ref 8–23)
CO2: 27 mmol/L (ref 22–32)
Calcium: 9 mg/dL (ref 8.9–10.3)
Chloride: 100 mmol/L (ref 98–111)
Creatinine, Ser: 0.76 mg/dL (ref 0.44–1.00)
GFR, Estimated: >60 mL/min (ref >60)
Glucose, Bld: 147 mg/dL — ABNORMAL HIGH (ref 70–99)
Potassium: 4.1 mmol/L (ref 3.5–5.1)
Sodium: 139 mmol/L (ref 135–145)

## 2021-02-02 LAB — CBC
HCT: 43.1 % (ref 36.0–46.0)
Hemoglobin: 13.6 g/dL (ref 12.0–15.0)
MCH: 29.6 pg (ref 26.0–34.0)
MCHC: 31.6 g/dL (ref 30.0–36.0)
MCV: 93.9 fL (ref 80.0–100.0)
Platelets: 176 10*3/uL (ref 150–400)
RBC: 4.59 MIL/uL (ref 3.87–5.11)
RDW: 14.4 % (ref 11.5–15.5)
WBC: 10.5 10*3/uL (ref 4.0–10.5)
nRBC: 0 % (ref 0.0–0.2)

## 2021-02-02 LAB — LACTIC ACID, PLASMA
Lactic Acid, Venous: 1.2 mmol/L (ref 0.5–1.9)
Lactic Acid, Venous: 0.7 mmol/L (ref 0.5–1.9)

## 2021-02-02 LAB — TROPONIN I (HIGH SENSITIVITY)
Troponin I (High Sensitivity): 16 ng/L (ref <18)
Troponin I (High Sensitivity): 14 ng/L (ref <18)

## 2021-02-02 LAB — D-DIMER, QUANTITATIVE: D-Dimer, Quant: 4.32 ug/mL-FEU — ABNORMAL HIGH (ref 0.00–0.50)

## 2021-02-02 LAB — FIBRINOGEN: Fibrinogen: 324 mg/dL (ref 210–475)

## 2021-02-02 LAB — PROCALCITONIN: Procalcitonin: <0.1 ng/mL

## 2021-02-02 LAB — RESP PANEL BY RT-PCR (FLU A&B, COVID) ARPGX2
Influenza A by PCR: NEGATIVE
Influenza B by PCR: NEGATIVE
SARS Coronavirus 2 by RT PCR: POSITIVE — AB

## 2021-02-02 LAB — LACTATE DEHYDROGENASE: LDH: 257 U/L — ABNORMAL HIGH (ref 98–192)

## 2021-02-02 LAB — FERRITIN: Ferritin: 926 ng/mL — ABNORMAL HIGH (ref 11–307)

## 2021-02-02 LAB — TSH: TSH: 3.374 u[IU]/mL (ref 0.350–4.500)

## 2021-02-02 LAB — BRAIN NATRIURETIC PEPTIDE: B Natriuretic Peptide: 11.7 pg/mL (ref 0.0–100.0)

## 2021-02-02 MED ORDER — MOMETASONE FURO-FORMOTEROL FUM 200-5 MCG/ACT IN AERO
2.0000 | INHALATION_SPRAY | Freq: Two times a day (BID) | RESPIRATORY_TRACT | Status: DC
  Administered 2021-02-03 – 2021-02-04 (×3): 2 via RESPIRATORY_TRACT
  Filled 2021-02-02: qty 8.8

## 2021-02-02 MED ORDER — ACETAMINOPHEN 325 MG PO TABS
650.0000 mg | ORAL_TABLET | Freq: Four times a day (QID) | ORAL | Status: DC | PRN
  Administered 2021-02-04: 650 mg via ORAL
  Filled 2021-02-02: qty 2

## 2021-02-02 MED ORDER — METHYLPREDNISOLONE SODIUM SUCC 125 MG IJ SOLR
0.5000 mg/kg | Freq: Two times a day (BID) | INTRAMUSCULAR | Status: DC
  Administered 2021-02-02 – 2021-02-03 (×3): 43.125 mg via INTRAVENOUS
  Filled 2021-02-02 (×3): qty 2

## 2021-02-02 MED ORDER — SODIUM CHLORIDE 0.9 % IV SOLN
200.0000 mg | Freq: Once | INTRAVENOUS | Status: AC
  Administered 2021-02-02: 200 mg via INTRAVENOUS
  Filled 2021-02-02: qty 40

## 2021-02-02 MED ORDER — DOCUSATE SODIUM 100 MG PO CAPS: 100.0000 mg | ORAL_CAPSULE | Freq: Two times a day (BID) | ORAL | Status: DC | PRN

## 2021-02-02 MED ORDER — TIOTROPIUM BROMIDE MONOHYDRATE 18 MCG IN CAPS
18.0000 ug | ORAL_CAPSULE | Freq: Every day | RESPIRATORY_TRACT | Status: DC
  Administered 2021-02-03 – 2021-02-04 (×2): 18 ug via RESPIRATORY_TRACT
  Filled 2021-02-02: qty 5

## 2021-02-02 MED ORDER — ONDANSETRON HCL 4 MG PO TABS: 4.0000 mg | ORAL_TABLET | Freq: Four times a day (QID) | ORAL | Status: DC | PRN

## 2021-02-02 MED ORDER — ALBUTEROL SULFATE (2.5 MG/3ML) 0.083% IN NEBU: 3.0000 mL | INHALATION_SOLUTION | Freq: Four times a day (QID) | RESPIRATORY_TRACT | Status: DC | PRN

## 2021-02-02 MED ORDER — VITAMIN D 25 MCG (1000 UNIT) PO TABS
1000.0000 [IU] | ORAL_TABLET | Freq: Every day | ORAL | Status: DC
  Administered 2021-02-03: 1000 [IU] via ORAL
  Filled 2021-02-02: qty 1

## 2021-02-02 MED ORDER — GUAIFENESIN-DM 100-10 MG/5ML PO SYRP: 10.0000 mL | ORAL_SOLUTION | ORAL | Status: DC | PRN

## 2021-02-02 MED ORDER — VITAMIN B-12 1000 MCG PO TABS
1000.0000 ug | ORAL_TABLET | Freq: Every day | ORAL | Status: DC
  Administered 2021-02-03 – 2021-02-04 (×2): 1000 ug via ORAL
  Filled 2021-02-02 (×2): qty 1

## 2021-02-02 MED ORDER — METHOCARBAMOL 500 MG PO TABS
500.0000 mg | ORAL_TABLET | Freq: Three times a day (TID) | ORAL | Status: DC | PRN
  Filled 2021-02-02: qty 1

## 2021-02-02 MED ORDER — ONDANSETRON HCL 4 MG/2ML IJ SOLN: 4.0000 mg | Freq: Four times a day (QID) | INTRAMUSCULAR | Status: DC | PRN

## 2021-02-02 MED ORDER — ASCORBIC ACID 500 MG PO TABS
500.0000 mg | ORAL_TABLET | Freq: Every day | ORAL | Status: DC
  Administered 2021-02-04: 09:00:00 500 mg via ORAL
  Filled 2021-02-02 (×2): qty 1

## 2021-02-02 MED ORDER — SODIUM CHLORIDE 0.9 % IV SOLN
100.0000 mg | Freq: Every day | INTRAVENOUS | Status: DC
  Administered 2021-02-03 – 2021-02-04 (×2): 100 mg via INTRAVENOUS
  Filled 2021-02-02: qty 100
  Filled 2021-02-02 (×3): qty 20

## 2021-02-02 MED ORDER — INSULIN ASPART 100 UNIT/ML IJ SOLN
0.0000 [IU] | Freq: Three times a day (TID) | INTRAMUSCULAR | Status: DC
  Administered 2021-02-03: 3 [IU] via SUBCUTANEOUS
  Administered 2021-02-03: 2 [IU] via SUBCUTANEOUS
  Administered 2021-02-03 – 2021-02-04 (×3): 3 [IU] via SUBCUTANEOUS
  Filled 2021-02-02 (×5): qty 1

## 2021-02-02 MED ORDER — PREDNISONE 50 MG PO TABS: 50.0000 mg | ORAL_TABLET | Freq: Every day | ORAL | Status: DC

## 2021-02-02 MED ORDER — ENOXAPARIN SODIUM 40 MG/0.4ML IJ SOSY
40.0000 mg | PREFILLED_SYRINGE | INTRAMUSCULAR | Status: DC
  Administered 2021-02-02: 40 mg via SUBCUTANEOUS
  Filled 2021-02-02: qty 0.4

## 2021-02-02 MED ORDER — PANTOPRAZOLE SODIUM 40 MG PO TBEC
40.0000 mg | DELAYED_RELEASE_TABLET | Freq: Two times a day (BID) | ORAL | Status: DC
  Administered 2021-02-02 – 2021-02-04 (×2): 40 mg via ORAL
  Filled 2021-02-02 (×3): qty 1

## 2021-02-02 MED ORDER — FERROUS SULFATE 325 (65 FE) MG PO TABS
325.0000 mg | ORAL_TABLET | Freq: Two times a day (BID) | ORAL | Status: DC
  Administered 2021-02-02 – 2021-02-04 (×2): 325 mg via ORAL
  Filled 2021-02-02 (×5): qty 1

## 2021-02-02 NOTE — ED Provider Notes (Signed)
[facility] Emergency Department Provider Note   ____________________________________________   Event Date/Time   First MD Initiated Contact with Patient 02/02/21 1652     (approximate)  I have reviewed the triage vital signs and the nursing notes.   HISTORY  Chief Complaint Shortness of Breath and Weakness    HPI [name] [name] is a 75 y.o. female who presents for shortness of breath  LOCATION: Chest DURATION: 2 weeks prior to arrival TIMING: Acutely worsening over the last 3 days SEVERITY: Severe QUALITY: Shortness of breath CONTEXT: Patient has a stated past medical history of COPD on 3 L chronically by nasal cannula who presents for worsening shortness of breath over the last 2 weeks and increasing oxygen requirement. MODIFYING FACTORS: Patient states that exertion worsens her shortness of breath and its partially relieved at rest ASSOCIATED SYMPTOMS: Presyncope   Per medical record review, patient has history of COPD and hypertension          Past Medical History:  Diagnosis Date   COPD (chronic obstructive pulmonary disease) (HCC)    Hypertension     Patient Active Problem List   Diagnosis Date Noted   COVID-19 virus infection 02/02/2021   COVID 02/02/2021   Symptomatic anemia 10/28/2017   GI bleed 10/28/2017   COPD exacerbation (HCC) 09/04/2014   Acute respiratory failure with hypercapnia (HCC) 09/04/2014    Past Surgical History:  Procedure Laterality Date   ABDOMINAL HYSTERECTOMY     COLONOSCOPY N/A 10/30/2017   Procedure: COLONOSCOPY;  Surgeon: Toledo, Boykin Nearing, MD;  Location: [facility] ENDOSCOPY;  Service: Gastroenterology;  Laterality: N/A;   ESOPHAGOGASTRODUODENOSCOPY N/A 10/30/2017   Procedure: ESOPHAGOGASTRODUODENOSCOPY (EGD);  Surgeon: Toledo, Boykin Nearing, MD;  Location: [facility] ENDOSCOPY;  Service: Gastroenterology;  Laterality: N/A;    Prior to Admission medications   Medication Sig Start Date End Date Taking? Authorizing  Provider  albuterol (PROVENTIL HFA;VENTOLIN HFA) 108 (90 Base) MCG/ACT inhaler Inhale 2 puffs into the lungs every 6 (six) hours as needed for wheezing or shortness of breath.    [provider]  albuterol (PROVENTIL) (2.5 MG/3ML) 0.083% nebulizer solution Inhale 3 mLs into the lungs every 6 (six) hours as needed for wheezing.     [provider]  cholecalciferol (VITAMIN D) 1000 units tablet Take 1,000 Units by mouth daily with lunch.    [provider]  docusate sodium (COLACE) 100 MG capsule Take 100 mg by mouth 2 (two) times daily as needed for mild constipation or moderate constipation.     [provider]  ferrous sulfate 325 (65 FE) MG tablet Take 1 tablet (325 mg total) by mouth 2 (two) times daily with a meal. 11/01/17   Gouru, Aruna, MD  Fluticasone-Salmeterol (ADVAIR) 500-50 MCG/DOSE AEPB Inhale 1 puff into the lungs every 12 (twelve) hours.    [provider]  methocarbamol (ROBAXIN) 500 MG tablet Take 1 tablet (500 mg total) by mouth every 8 (eight) hours as needed for muscle spasms. 08/11/19   Cuthriell, Delorise Royals, PA-C  pantoprazole (PROTONIX) 40 MG tablet Take 1 tablet (40 mg total) by mouth 2 (two) times daily. 11/01/17   Gouru, Deanna Artis, MD  predniSONE (DELTASONE) 10 MG tablet Take 1 tablet (10 mg total) by mouth daily. 08/11/19   Cuthriell, Delorise Royals, PA-C  tiotropium (SPIRIVA) 18 MCG inhalation capsule Place 1 capsule (18 mcg total) into inhaler and inhale daily. Patient taking differently: Place 18 mcg into inhaler and inhale daily with lunch.  09/12/14   Hande,  Vishwanath, MD  vitamin B-12 (CYANOCOBALAMIN) 1000 MCG tablet Take 1 tablet (1,000 mcg total) by mouth daily. 11/01/17   Ramonita Lab, MD  vitamin C (ASCORBIC ACID) 500 MG tablet Take 500 mg by mouth daily.    [provider]    Allergies Patient has no known allergies.  Family History  Problem Relation Age of Onset   Hypertension Mother    Melanoma Father     Social  History Social History   Tobacco Use   Smoking status: Former   Smokeless tobacco: Never  Building services engineer Use: Never used  Substance Use Topics   Alcohol use: No   Drug use: Never    Review of Systems Constitutional: No fever/chills Eyes: No visual changes. ENT: No sore throat. Cardiovascular: Denies chest pain. Respiratory: Endorses shortness of breath. Gastrointestinal: No abdominal pain.  No nausea, no vomiting.  No diarrhea. Genitourinary: Negative for dysuria. Musculoskeletal: Negative for acute arthralgias Skin: Negative for rash. Neurological: Negative for headaches, weakness/numbness/paresthesias in any extremity Psychiatric: Negative for suicidal ideation/homicidal ideation   ____________________________________________   PHYSICAL EXAM:  VITAL SIGNS: ED Triage Vitals  Enc Vitals Group     BP 02/02/21 1238 (!) 148/64     Pulse Rate 02/02/21 1238 (!) 123     Resp 02/02/21 1238 (!) 26     Temp 02/02/21 1238 98.1 F (36.7 C)     Temp Source 02/02/21 1238 Oral     SpO2 02/02/21 1238 93 %     Weight 02/02/21 1239 190 lb 0.6 oz (86.2 kg)     Height 02/02/21 1239 5\' 7"  (1.702 m)     Head Circumference --      Peak Flow --      Pain Score 02/02/21 1238 0     Pain Loc --      Pain Edu? --      Excl. in GC? --    Constitutional: Alert and oriented. Well appearing and in no acute distress. Eyes: Conjunctivae are normal. PERRL. Head: Atraumatic. Nose: No congestion/rhinnorhea. Mouth/Throat: Mucous membranes are moist. Neck: No stridor Cardiovascular: Grossly normal heart sounds.  Good peripheral circulation. Respiratory: Increased respiratory effort.  Rales over bilateral lower lung fields.  4 L oxygen by nasal cannula in place.  No retractions. Gastrointestinal: Soft and nontender. No distention. Musculoskeletal: No obvious deformities Neurologic:  Normal speech and language. No gross focal neurologic deficits are appreciated. Skin:  Skin is warm and  dry. No rash noted. Psychiatric: Mood and affect are normal. Speech and behavior are normal.  ____________________________________________   LABS (all labs ordered are listed, but only abnormal results are displayed)  Labs Reviewed  RESP PANEL BY RT-PCR (FLU A&B, COVID) ARPGX2 - Abnormal; Notable for the following components:      Result Value   SARS Coronavirus 2 by RT PCR POSITIVE (*)    All other components within normal limits  BASIC METABOLIC PANEL - Abnormal; Notable for the following components:   Glucose, Bld 147 (*)    All other components within normal limits  CBC  URINALYSIS, ROUTINE W REFLEX MICROSCOPIC  BRAIN NATRIURETIC PEPTIDE  D-DIMER, QUANTITATIVE  FERRITIN  FIBRINOGEN  LACTATE DEHYDROGENASE  PROCALCITONIN  TROPONIN I (HIGH SENSITIVITY)   ____________________________________________  EKG  ED ECG REPORT I, 02/04/21, the attending physician, personally viewed and interpreted this ECG.  Date: 02/02/2021 EKG Time: 1254 Rate: 122 Rhythm: normal sinus rhythm QRS Axis: normal Intervals: normal ST/T Wave abnormalities: normal Narrative Interpretation: no  evidence of acute ischemia  ____________________________________________  RADIOLOGY  ED MD interpretation: 2 view chest x-ray shows bibasilar subsegmental atelectasis or scarring  Official radiology report(s): DG Chest 2 View  Result Date: 02/02/2021 CLINICAL DATA:  Shortness of breath. EXAM: CHEST - 2 VIEW COMPARISON:  May 27, 2018. FINDINGS: The heart size and mediastinal contours are within normal limits. Mild bibasilar subsegmental atelectasis or scarring is noted. The visualized skeletal structures are unremarkable. IMPRESSION: Mild bibasilar subsegmental atelectasis or scarring. Aortic Atherosclerosis (ICD10-I70.0). Electronically Signed   By: Lupita Raider M.D.   On: 02/02/2021 13:13    ____________________________________________   PROCEDURES  Procedure(s) performed (including  Critical Care):  .1-3 Lead EKG Interpretation Performed by: Merwyn Katos, MD Authorized by: Merwyn Katos, MD     Interpretation: abnormal     ECG rate:  122   ECG rate assessment: tachycardic     Rhythm: sinus tachycardia     Ectopy: none     Conduction: normal    CRITICAL CARE Performed by: Merwyn Katos   Total critical care time: 31 minutes  Critical care time was exclusive of separately billable procedures and treating other patients.  Critical care was necessary to treat or prevent imminent or life-threatening deterioration.  Critical care was time spent personally by me on the following activities: development of treatment plan with patient and/or surrogate as well as nursing, discussions with consultants, evaluation of patient's response to treatment, examination of patient, obtaining history from patient or surrogate, ordering and performing treatments and interventions, ordering and review of laboratory studies, ordering and review of radiographic studies, pulse oximetry and re-evaluation of patient's condition.  ____________________________________________   INITIAL IMPRESSION / ASSESSMENT AND PLAN / ED COURSE  As part of my medical decision making, I reviewed the following data within the electronic medical record, if available:  Nursing notes reviewed and incorporated, Labs reviewed, EKG interpreted, Old chart reviewed, Radiograph reviewed and Notes from prior ED visits reviewed and incorporated      Presentation most consistent with Viral Syndrome.  Patient has tested positive for COVID-19. At this time patient is requiring submental oxygenation due to acute hypoxic respiratory failure.  Given History and Exam I have a lower suspicion for: Emergent CardioPulmonary causes [such as Acute Asthma or COPD Exacerbation, acute Heart Failure or exacerbation, PE, PTX, atypical ACS, PNA]. Emergent Otolaryngeal causes [such as PTA, RPA, Ludwigs, Epiglottitis,  EBV].  Regarding Emergent Travel or Immunosuppressive related infectious: I have a low suspicion for acute HIV.  Given radiologic evidence for patchy bilateral airspace opacities concerning for viral pneumonia, continued need for increased supplemental oxygenation due to acute hypoxic respiratory failure, and need for further evaluation and management, patient will require admission  Dispo: Admit to medicine     ____________________________________________   FINAL CLINICAL IMPRESSION(S) / ED DIAGNOSES  Final diagnoses:  COVID-19 virus infection  Generalized weakness  Postural dizziness with presyncope  Hypoxia     ED Discharge Orders     None        Note:  This document was prepared using Dragon voice recognition software and may include unintentional dictation errors.    Merwyn Katos, MD 02/02/21 [phone]

## 2021-02-02 NOTE — Progress Notes (Signed)
Remdesivir - Pharmacy Brief Note   O:  CXR: Mild bibasilar subsegmental atelectasis or scarring SpO2: 96% on RA   A/P:  Remdesivir 200 mg IVPB once followed by 100 mg IVPB daily x 4 days.   Raiford Noble, PharmD Clinical Pharmacist 02/02/2021 5:02 PM

## 2021-02-02 NOTE — H&P (Signed)
History and Physical    [name] [name] MHD:[id number] DOB: 08/10/1945 DOA: 02/02/2021  PCP: Barbette Reichmann, MD (Confirm with patient/family/NH records and if not entered, this has to be entered at [facility] point of entry) Patient coming from: [facility]  I have personally briefly reviewed patient's old medical records in [facility]  Chief Complaint: SOB.  HPI: [name] [name] is a 75 y.o. female with medical history significant of COPD, chronic hypoxic respite failure on 3 L 24/7, came with worsening of her cough wheezing and shortness of breath.  Her symptoms started about 5 to 6 days ago, gradually getting worse, symptoms involve dry cough, wheezing and shortness of breath.  She has subjective fever no chills.  Denies any chest pain, no abdominal pain no diarrhea.  She is not vaccinated for COVID for personal reasons.  ED Course: O2 saturation dropped with minimal activity, then stabilized on 4 L, tachypneic and tachycardia.  X-ray suspicious for early interstitial infiltrates bilaterally.  COVID turned positive.  Review of Systems: As per HPI otherwise 14 point review of systems negative.    Past Medical History:  Diagnosis Date   COPD (chronic obstructive pulmonary disease) (HCC)    Hypertension     Past Surgical History:  Procedure Laterality Date   ABDOMINAL HYSTERECTOMY     COLONOSCOPY N/A 10/30/2017   Procedure: COLONOSCOPY;  Surgeon: Toledo, Boykin Nearing, MD;  Location: [facility] ENDOSCOPY;  Service: Gastroenterology;  Laterality: N/A;   ESOPHAGOGASTRODUODENOSCOPY N/A 10/30/2017   Procedure: ESOPHAGOGASTRODUODENOSCOPY (EGD);  Surgeon: Toledo, Boykin Nearing, MD;  Location: [facility] ENDOSCOPY;  Service: Gastroenterology;  Laterality: N/A;     reports that she has quit smoking. She has never used smokeless tobacco. She reports that she does not drink alcohol and does not use drugs.  No Known Allergies  Family History  Problem Relation Age of Onset   Hypertension Mother    Melanoma Father       Prior to Admission medications   Medication Sig Start Date End Date Taking? Authorizing Provider  albuterol (PROVENTIL HFA;VENTOLIN HFA) 108 (90 Base) MCG/ACT inhaler Inhale 2 puffs into the lungs every 6 (six) hours as needed for wheezing or shortness of breath.    [provider]  albuterol (PROVENTIL) (2.5 MG/3ML) 0.083% nebulizer solution Inhale 3 mLs into the lungs every 6 (six) hours as needed for wheezing.     [provider]  cholecalciferol (VITAMIN D) 1000 units tablet Take 1,000 Units by mouth daily with lunch.    [provider]  docusate sodium (COLACE) 100 MG capsule Take 100 mg by mouth 2 (two) times daily as needed for mild constipation or moderate constipation.     [provider]  ferrous sulfate 325 (65 FE) MG tablet Take 1 tablet (325 mg total) by mouth 2 (two) times daily with a meal. 11/01/17   Gouru, Aruna, MD  Fluticasone-Salmeterol (ADVAIR) 500-50 MCG/DOSE AEPB Inhale 1 puff into the lungs every 12 (twelve) hours.    [provider]  methocarbamol (ROBAXIN) 500 MG tablet Take 1 tablet (500 mg total) by mouth every 8 (eight) hours as needed for muscle spasms. 08/11/19   Cuthriell, Delorise Royals, PA-C  pantoprazole (PROTONIX) 40 MG tablet Take 1 tablet (40 mg total) by mouth 2 (two) times daily. 11/01/17   Gouru, Deanna Artis, MD  predniSONE (DELTASONE) 10 MG tablet Take 1 tablet (10 mg total) by mouth daily. 08/11/19   Cuthriell, Delorise Royals, PA-C  tiotropium (SPIRIVA) 18 MCG inhalation capsule Place 1 capsule (18  mcg total) into inhaler and inhale daily. Patient taking differently: Place 18 mcg into inhaler and inhale daily with lunch.  09/12/14   Barbette Reichmann, MD  vitamin B-12 (CYANOCOBALAMIN) 1000 MCG tablet Take 1 tablet (1,000 mcg total) by mouth daily. 11/01/17   Ramonita Lab, MD  vitamin C (ASCORBIC ACID) 500 MG tablet Take 500 mg by mouth daily.    [provider]    Physical Exam: Vitals:   02/02/21 1238 02/02/21  1239  BP: (!) 148/64   Pulse: (!) 123   Resp: (!) 26   Temp: 98.1 F (36.7 C)   TempSrc: Oral   SpO2: 93%   Weight:  86.2 kg  Height:  5\' 7"  (1.702 m)    Constitutional: NAD, calm, comfortable Vitals:   02/02/21 1238 02/02/21 1239  BP: (!) 148/64   Pulse: (!) 123   Resp: (!) 26   Temp: 98.1 F (36.7 C)   TempSrc: Oral   SpO2: 93%   Weight:  86.2 kg  Height:  5\' 7"  (1.702 m)   Eyes: PERRL, lids and conjunctivae normal ENMT: Mucous membranes are moist. Posterior pharynx clear of any exudate or lesions.Normal dentition.  Neck: normal, supple, no masses, no thyromegaly Respiratory: Diminished breathing sound bilaterally, scattered wheezing, no crackles.  Increasing respiratory effort. No accessory muscle use.  Cardiovascular: Regular rate and rhythm, no murmurs / rubs / gallops. No extremity edema. 2+ pedal pulses. No carotid bruits.  Abdomen: no tenderness, no masses palpated. No hepatosplenomegaly. Bowel sounds positive.  Musculoskeletal: no clubbing / cyanosis. No joint deformity upper and lower extremities. Good ROM, no contractures. Normal muscle tone.  Skin: no rashes, lesions, ulcers. No induration Neurologic: CN 2-12 grossly intact. Sensation intact, DTR normal. Strength 5/5 in all 4.  Psychiatric: Normal judgment and insight. Alert and oriented x 3. Normal mood.     Labs on Admission: I have personally reviewed following labs and imaging studies  CBC: Recent Labs  Lab 02/02/21 1241  WBC 10.5  HGB 13.6  HCT 43.1  MCV 93.9  PLT 176   Basic Metabolic Panel: Recent Labs  Lab 02/02/21 1241  NA 139  K 4.1  CL 100  CO2 27  GLUCOSE 147*  BUN 19  CREATININE 0.76  CALCIUM 9.0   GFR: Estimated Creatinine Clearance: 68.5 mL/min (by C-G formula based on SCr of 0.76 mg/dL). Liver Function Tests: No results for input(s): AST, ALT, ALKPHOS, BILITOT, PROT, ALBUMIN in the last 168 hours. No results for input(s): LIPASE, AMYLASE in the last 168 hours. No results  for input(s): AMMONIA in the last 168 hours. Coagulation Profile: No results for input(s): INR, PROTIME in the last 168 hours. Cardiac Enzymes: No results for input(s): CKTOTAL, CKMB, CKMBINDEX, TROPONINI in the last 168 hours. BNP (last 3 results) No results for input(s): PROBNP in the last 8760 hours. HbA1C: No results for input(s): HGBA1C in the last 72 hours. CBG: No results for input(s): GLUCAP in the last 168 hours. Lipid Profile: No results for input(s): CHOL, HDL, LDLCALC, TRIG, CHOLHDL, LDLDIRECT in the last 72 hours. Thyroid Function Tests: No results for input(s): TSH, T4TOTAL, FREET4, T3FREE, THYROIDAB in the last 72 hours. Anemia Panel: No results for input(s): VITAMINB12, FOLATE, FERRITIN, TIBC, IRON, RETICCTPCT in the last 72 hours. Urine analysis:    Component Value Date/Time   COLORURINE YELLOW (A) 10/29/2017 0408   APPEARANCEUR HAZY (A) 10/29/2017 0408   LABSPEC 1.019 10/29/2017 0408   PHURINE 5.0 10/29/2017 0408   GLUCOSEU NEGATIVE 10/29/2017  0408   HGBUR NEGATIVE 10/29/2017 0408   BILIRUBINUR NEGATIVE 10/29/2017 0408   KETONESUR NEGATIVE 10/29/2017 0408   PROTEINUR NEGATIVE 10/29/2017 0408   NITRITE NEGATIVE 10/29/2017 0408   LEUKOCYTESUR TRACE (A) 10/29/2017 0408    Radiological Exams on Admission: DG Chest 2 View  Result Date: 02/02/2021 CLINICAL DATA:  Shortness of breath. EXAM: CHEST - 2 VIEW COMPARISON:  May 27, 2018. FINDINGS: The heart size and mediastinal contours are within normal limits. Mild bibasilar subsegmental atelectasis or scarring is noted. The visualized skeletal structures are unremarkable. IMPRESSION: Mild bibasilar subsegmental atelectasis or scarring. Aortic Atherosclerosis (ICD10-I70.0). Electronically Signed   By: Lupita Raider M.D.   On: 02/02/2021 13:13    EKG: Independently reviewed. Sinus tachy, no acute ST-T changes.  Assessment/Plan Principal Problem:   COVID Active Problems:   COVID-19 virus infection  (please  populate well all problems here in Problem List. (For example, if patient is on BP meds at home and you resume or decide to hold them, it is a problem that needs to be her. Same for CAD, COPD, HLD and so on)  Acute on chronic hypoxic respite failure -Likely secondary to COVID-19 infection. -Send VBG to rule out CO2 retention/acidosis. -Discussed with patient regarding remdesivir treatment, patient agreed.  Given patient has comorbidities of elderly age and baseline COPD and hypoxia, decided to start remdesivir. -Short course of IV steroid bridging for p.o. steroid. -Other breathing treatment. -Encourage proning position if tolerates.  Impending COVID-19 pneumonia -Acute findings suspect increasing bilateral peripheral interstitial infiltrates compatible with viral pneumonia, remdesivir as above.  Acute COPD exacerbation -As above.  Sinus tachycardia -No chest pains, appears to be euvolemic, will send a D-dimer and lactic acid.  Elevated glucose, without diagnosis of diabetes -Sliding scale given patient will be on steroids for COVID and COPD exacerbation.  DVT prophylaxis: Lovenox Code Status: Full code Family Communication: Sister at bedside Disposition Plan: Expect more than 2 midnight hospital stay to wean down oxygen and IV steroid treatment. Consults called: NOne Admission status: MedSurg   Emeline General MD [facility] Pager [phone]  02/02/2021, 4:59 PM

## 2021-02-02 NOTE — ED Triage Notes (Signed)
Pt comes into the ED via [facility] clinic for [facility] and weakness that has progressed over a week.  Pt normally wears 2.5L at baseline for COPD.  Pt states her [facility] has gotten worse and now she has it all the time instead of just with exertion.  PT using accessory muscled with inhalation.  Pt currently able to maintain Oxygen saturation levels at 92 on 3L.

## 2021-02-03 ENCOUNTER — Other Ambulatory Visit: Payer: Self-pay

## 2021-02-03 DIAGNOSIS — I10 Essential (primary) hypertension: Secondary | ICD-10-CM | POA: Diagnosis present

## 2021-02-03 DIAGNOSIS — E663 Overweight: Secondary | ICD-10-CM | POA: Diagnosis present

## 2021-02-03 DIAGNOSIS — J9602 Acute respiratory failure with hypercapnia: Secondary | ICD-10-CM | POA: Diagnosis not present

## 2021-02-03 DIAGNOSIS — J441 Chronic obstructive pulmonary disease with (acute) exacerbation: Secondary | ICD-10-CM

## 2021-02-03 DIAGNOSIS — U071 COVID-19: Secondary | ICD-10-CM | POA: Diagnosis not present

## 2021-02-03 LAB — COMPREHENSIVE METABOLIC PANEL
ALT: 20 U/L (ref 0–44)
AST: 35 U/L (ref 15–41)
Albumin: 3 g/dL — ABNORMAL LOW (ref 3.5–5.0)
Alkaline Phosphatase: 86 U/L (ref 38–126)
Anion gap: 11 (ref 5–15)
BUN: 15 mg/dL (ref 8–23)
CO2: 25 mmol/L (ref 22–32)
Calcium: 8.2 mg/dL — ABNORMAL LOW (ref 8.9–10.3)
Chloride: 103 mmol/L (ref 98–111)
Creatinine, Ser: 0.73 mg/dL (ref 0.44–1.00)
GFR, Estimated: >60 mL/min (ref >60)
Glucose, Bld: 190 mg/dL — ABNORMAL HIGH (ref 70–99)
Potassium: 4 mmol/L (ref 3.5–5.1)
Sodium: 139 mmol/L (ref 135–145)
Total Bilirubin: 1.4 mg/dL — ABNORMAL HIGH (ref 0.3–1.2)
Total Protein: 7 g/dL (ref 6.5–8.1)

## 2021-02-03 LAB — CBC WITH DIFFERENTIAL/PLATELET
Abs Immature Granulocytes: 0.12 10*3/uL — ABNORMAL HIGH (ref 0.00–0.07)
Basophils Absolute: 0 10*3/uL (ref 0.0–0.1)
Basophils Relative: 0 %
Eosinophils Absolute: 0 10*3/uL (ref 0.0–0.5)
Eosinophils Relative: 0 %
HCT: 38.5 % (ref 36.0–46.0)
Hemoglobin: 12.3 g/dL (ref 12.0–15.0)
Immature Granulocytes: 1 %
Lymphocytes Relative: 5 %
Lymphs Abs: 0.5 10*3/uL — ABNORMAL LOW (ref 0.7–4.0)
MCH: 29.7 pg (ref 26.0–34.0)
MCHC: 31.9 g/dL (ref 30.0–36.0)
MCV: 93 fL (ref 80.0–100.0)
Monocytes Absolute: 0.1 10*3/uL (ref 0.1–1.0)
Monocytes Relative: 1 %
Neutro Abs: 9.2 10*3/uL — ABNORMAL HIGH (ref 1.7–7.7)
Neutrophils Relative %: 93 %
Platelets: 183 10*3/uL (ref 150–400)
RBC: 4.14 MIL/uL (ref 3.87–5.11)
RDW: 14.2 % (ref 11.5–15.5)
WBC: 9.9 10*3/uL (ref 4.0–10.5)
nRBC: 0 % (ref 0.0–0.2)

## 2021-02-03 LAB — GLUCOSE, CAPILLARY
Glucose-Capillary: 175 mg/dL — ABNORMAL HIGH (ref 70–99)
Glucose-Capillary: 190 mg/dL — ABNORMAL HIGH (ref 70–99)

## 2021-02-03 LAB — FERRITIN: Ferritin: 1172 ng/mL — ABNORMAL HIGH (ref 11–307)

## 2021-02-03 LAB — CBG MONITORING, ED
Glucose-Capillary: 145 mg/dL — ABNORMAL HIGH (ref 70–99)
Glucose-Capillary: 185 mg/dL — ABNORMAL HIGH (ref 70–99)

## 2021-02-03 LAB — C-REACTIVE PROTEIN: CRP: 27.9 mg/dL — ABNORMAL HIGH (ref <1.0)

## 2021-02-03 LAB — MAGNESIUM: Magnesium: 2 mg/dL (ref 1.7–2.4)

## 2021-02-03 LAB — D-DIMER, QUANTITATIVE: D-Dimer, Quant: 4.73 ug/mL-FEU — ABNORMAL HIGH (ref 0.00–0.50)

## 2021-02-03 LAB — PHOSPHORUS: Phosphorus: 3.2 mg/dL (ref 2.5–4.6)

## 2021-02-03 MED ORDER — DILTIAZEM HCL 30 MG PO TABS
60.0000 mg | ORAL_TABLET | Freq: Two times a day (BID) | ORAL | Status: DC
  Administered 2021-02-04: 09:00:00 60 mg via ORAL
  Filled 2021-02-03 (×2): qty 2

## 2021-02-03 NOTE — Progress Notes (Signed)
PROGRESS NOTE  MAHATI VAJDA ZDG:[id number] DOB: Sep 10, 1945 DOA: 02/02/2021 PCP: Barbette Reichmann, MD  HPI/Recap of past 20 hours: 75 year old female with past medical history of COPD normally on 3 L nasal cannula and hypertension presented to the emergency room on 11/18 with complaints of cough and shortness of breath and found to be hypoxic requiring 4 L nasal cannula to keep oxygen saturations above 90%.  Patient admitted to the hospitalist service and started on steroids and Remdisivir.  Patient feeling better, breathing a little bit easier although not yet back at baseline.  She states that she is very cautious about protecting herself and is not clear how she got COVID.  Assessment/Plan: Principal Problem:   Acute on chronic respiratory failure with hypercapnia/hypoxia (HCC) secondary to COVID infection in patient with COPD: Continue steroids, nebulizers, additional oxygenation and Remdisivir.  Oxygenation improving, trying to keep down to 3 L. Active Problems:   Overweight (BMI 25.0-29.9): Meets criteria BMI greater than 25    Hypertension: Blood pressure stable.  Code Status: Full code  Family Communication: Left message for daughter  Disposition Plan: Discharge once stable, back to oxygenated baseline and breathing comfortably, potential discharge tomorrow, 11/20   Consultants: None  Procedures: None  Antimicrobials: IV Remdisivir 11/18-present  DVT prophylaxis: Lovenox  Level of care: Med-Surg   Objective: Vitals:   02/03/21 0700 02/03/21 0800  BP: 136/80 125/78  Pulse: (!) 107 (!) 104  Resp: 20 (!) 22  Temp:    SpO2: 97% 99%   No intake or output data in the 24 hours ending 02/03/21 1103 Filed Weights   02/02/21 1239  Weight: 86.2 kg   Body mass index is 29.76 kg/m.  Exam:  General: Alert and oriented x3, no acute distress HEENT: Normocephalic and atraumatic, mucous membranes are moist Cardiovascular: Regular rhythm, borderline  tachycardia Respiratory: Decreased breath sounds throughout Abdomen: Soft, nontender, nondistended, positive bowel sounds Musculoskeletal: No clubbing or cyanosis or edema Skin: No skin breaks, tears or lesions Psychiatry: Appropriate, no evidence of psychoses Neurology: No focal deficits   Data Reviewed: CBC: Recent Labs  Lab 02/02/21 1241 02/03/21 0529  WBC 10.5 9.9  NEUTROABS  --  9.2*  HGB 13.6 12.3  HCT 43.1 38.5  MCV 93.9 93.0  PLT 176 183   Basic Metabolic Panel: Recent Labs  Lab 02/02/21 1241 02/03/21 0529  NA 139 139  K 4.1 4.0  CL 100 103  CO2 27 25  GLUCOSE 147* 190*  BUN 19 15  CREATININE 0.76 0.73  CALCIUM 9.0 8.2*  MG  --  2.0  PHOS  --  3.2   GFR: Estimated Creatinine Clearance: 68.5 mL/min (by C-G formula based on SCr of 0.73 mg/dL). Liver Function Tests: Recent Labs  Lab 02/03/21 0529  AST 35  ALT 20  ALKPHOS 86  BILITOT 1.4*  PROT 7.0  ALBUMIN 3.0*   No results for input(s): LIPASE, AMYLASE in the last 168 hours. No results for input(s): AMMONIA in the last 168 hours. Coagulation Profile: No results for input(s): INR, PROTIME in the last 168 hours. Cardiac Enzymes: No results for input(s): CKTOTAL, CKMB, CKMBINDEX, TROPONINI in the last 168 hours. BNP (last 3 results) No results for input(s): PROBNP in the last 8760 hours. HbA1C: No results for input(s): HGBA1C in the last 72 hours. CBG: Recent Labs  Lab 02/02/21 1950 02/03/21 0738 02/03/21 1059  GLUCAP 135* 145* 185*   Lipid Profile: No results for input(s): CHOL, HDL, LDLCALC, TRIG, CHOLHDL, LDLDIRECT in the last  72 hours. Thyroid Function Tests: Recent Labs    02/02/21 1653  TSH 3.374   Anemia Panel: Recent Labs    02/02/21 1653 02/03/21 0529  FERRITIN 926* 1,172*   Urine analysis:    Component Value Date/Time   COLORURINE AMBER (A) 02/02/2021 1725   APPEARANCEUR CLOUDY (A) 02/02/2021 1725   LABSPEC 1.025 02/02/2021 1725   PHURINE 5.0 02/02/2021 1725    GLUCOSEU NEGATIVE 02/02/2021 1725   HGBUR NEGATIVE 02/02/2021 1725   BILIRUBINUR NEGATIVE 02/02/2021 1725   KETONESUR 20 (A) 02/02/2021 1725   PROTEINUR 100 (A) 02/02/2021 1725   NITRITE NEGATIVE 02/02/2021 1725   LEUKOCYTESUR NEGATIVE 02/02/2021 1725   Sepsis Labs: @LABRCNTIP (procalcitonin:4,lacticidven:4)  ) Recent Results (from the past 240 hour(s))  Resp Panel by RT-PCR (Flu A&B, Covid) Nasopharyngeal Swab     Status: Abnormal   Collection Time: 02/02/21 12:41 PM   Specimen: Nasopharyngeal Swab; Nasopharyngeal(NP) swabs in vial transport medium  Result Value Ref Range Status   SARS Coronavirus 2 by RT PCR POSITIVE (A) NEGATIVE Final    Comment: RESULT CALLED TO, READ BACK BY AND VERIFIED WITH: KENDALL FLANAGAN 02/02/21 1346 KLW (NOTE) SARS-CoV-2 target nucleic acids are DETECTED.  The SARS-CoV-2 RNA is generally detectable in upper respiratory specimens during the acute phase of infection. Positive results are indicative of the presence of the identified virus, but do not rule out bacterial infection or co-infection with other pathogens not detected by the test. Clinical correlation with patient history and other diagnostic information is necessary to determine patient infection status. The expected result is Negative.  Fact Sheet for Patients: 02/04/21  Fact Sheet for Healthcare Providers: BloggerCourse.com  This test is not yet approved or cleared by the [employer] FDA and  has been authorized for detection and/or diagnosis of SARS-CoV-2 by FDA under an Emergency Use Authorization (EUA).  This EUA will remain in effect (meaning this test can b e used) for the duration of  the COVID-19 declaration under Section 564(b)(1) of the Act, 21 U.S.C. section 360bbb-3(b)(1), unless the authorization is terminated or revoked sooner.     Influenza A by PCR NEGATIVE NEGATIVE Final   Influenza B by PCR NEGATIVE  NEGATIVE Final    Comment: (NOTE) The Xpert Xpress SARS-CoV-2/FLU/RSV plus assay is intended as an aid in the diagnosis of influenza from Nasopharyngeal swab specimens and should not be used as a sole basis for treatment. Nasal washings and aspirates are unacceptable for Xpert Xpress SARS-CoV-2/FLU/RSV testing.  Fact Sheet for Patients: [place]  Fact Sheet for Healthcare Providers: BloggerCourse.com  This test is not yet approved or cleared by the [employer] FDA and has been authorized for detection and/or diagnosis of SARS-CoV-2 by FDA under an Emergency Use Authorization (EUA). This EUA will remain in effect (meaning this test can be used) for the duration of the COVID-19 declaration under Section 564(b)(1) of the Act, 21 U.S.C. section 360bbb-3(b)(1), unless the authorization is terminated or revoked.  Performed at [facility], [address] [place], [place]       Studies: DG Chest 2 View  Result Date: 02/02/2021 CLINICAL DATA:  Shortness of breath. EXAM: CHEST - 2 VIEW COMPARISON:  May 27, 2018. FINDINGS: The heart size and mediastinal contours are within normal limits. Mild bibasilar subsegmental atelectasis or scarring is noted. The visualized skeletal structures are unremarkable. IMPRESSION: Mild bibasilar subsegmental atelectasis or scarring. Aortic Atherosclerosis (ICD10-I70.0). Electronically Signed   By: May 29, 2018 M.D.   On: 02/02/2021 13:13  Scheduled Meds:  vitamin C  500 mg Oral Daily   cholecalciferol  1,000 Units Oral Q lunch   enoxaparin (LOVENOX) injection  40 mg Subcutaneous Q24H   ferrous sulfate  325 mg Oral BID WC   insulin aspart  0-15 Units Subcutaneous TID WC   methylPREDNISolone (SOLU-MEDROL) injection  0.5 mg/kg Intravenous Q12H   Followed by   Melene Muller ON 02/05/2021] predniSONE  50 mg Oral Daily   mometasone-formoterol  2 puff Inhalation BID    pantoprazole  40 mg Oral BID   tiotropium  18 mcg Inhalation Daily   vitamin B-12  1,000 mcg Oral Daily    Continuous Infusions:  remdesivir 100 mg in NS 100 mL       LOS: 1 day     Hollice Espy, MD [facility]   02/03/2021, 11:03 AM

## 2021-02-03 NOTE — Progress Notes (Addendum)
Security called to remove patient personal albuterol medication, due to suspected self use. MD made aware.

## 2021-02-03 NOTE — Progress Notes (Signed)
   02/03/21 1637  Assess: MEWS Score  Temp 98.6 F (37 C)  BP 110/68  Pulse Rate (!) 115  Resp 20  SpO2 95 %  O2 Device Nasal Cannula  O2 Flow Rate (L/min) 3 L/min  Assess: MEWS Score  MEWS Temp 0  MEWS Systolic 0  MEWS Pulse 2  MEWS RR 0  MEWS LOC 0  MEWS Score 2  MEWS Score Color Yellow  Assess: if the MEWS score is Yellow or Red  Were vital signs taken at a resting state? Yes  Focused Assessment No change from prior assessment  Does the patient meet 2 or more of the SIRS criteria? No  MEWS guidelines implemented *See Row Information* Yes  Treat  MEWS Interventions Other (Comment)  Pain Scale 0-10  Pain Score 0  Complains of Other (Comment) (no complaints)  Interventions Relaxation  Patients response to intervention Unchanged  Take Vital Signs  Increase Vital Sign Frequency  Yellow: Q 2hr X 2 then Q 4hr X 2, if remains yellow, continue Q 4hrs  Escalate  MEWS: Escalate Yellow: discuss with charge nurse/RN and consider discussing with provider and RRT  Notify: Charge Nurse/RN  Name of Charge Nurse/RN Notified Robyn  Date Charge Nurse/RN Notified 02/03/21  Time Charge Nurse/RN Notified 1713  Notify: Provider  Provider Name/Title Virginia Rochester, MD  Date Provider Notified 02/03/21  Time Provider Notified [phone]  Notification Type  (Secure chat)  Notification Reason Other (Comment) (increased heart rate)  Provider response See new orders;Other (Comment) (call)  Date of Provider Response 02/03/21  Time of Provider Response 1744  Document  Patient Outcome Other (Comment) (unchanged patient refused new medication, MD aware. No new orders.)  Assess: SIRS CRITERIA  SIRS Temperature  0  SIRS Pulse 1  SIRS Respirations  0  SIRS WBC 0  SIRS Score Sum  1  Will continue to monitor.

## 2021-02-03 NOTE — ED Notes (Signed)
ED TO INPATIENT HANDOFF REPORT  ED Nurse Name and Phone #: Feliz Beam [phone]  P Name/Age/Gender [name] [name] 75 y.o. female Room/Bed: ED15A/ED15A  Code Status   Code Status: Full Code  Home/SNF/Other Home Patient oriented to: self, place, time, and situation Is this baseline? Yes   Triage Complete: Triage complete  Chief Complaint COVID [U07.1]  Triage Note Pt comes into the ED via [facility] clinic for [facility] and weakness that has progressed over a week.  Pt normally wears 2.5L at baseline for COPD.  Pt states her [facility] has gotten worse and now she has it all the time instead of just with exertion.  PT using accessory muscled with inhalation.  Pt currently able to maintain Oxygen saturation levels at 92 on 3L.     Allergies No Known Allergies  Level of Care/Admitting Diagnosis ED Disposition     ED Disposition  Admit   Condition  --   Comment  Hospital Area: [facility] [[id number]]  Level of Care: Med-Surg [16]  Covid Evaluation: Confirmed COVID Positive  Diagnosis: COVID [8099833]  Admitting Physician: Emeline General [[id number]]  Attending Physician: Emeline General [[id number]]  Estimated length of stay: past midnight tomorrow  Certification:: I certify this patient will need inpatient services for at least 2 midnights          B Medical/Surgery History Past Medical History:  Diagnosis Date   COPD (chronic obstructive pulmonary disease) (HCC)    Hypertension    Past Surgical History:  Procedure Laterality Date   ABDOMINAL HYSTERECTOMY     COLONOSCOPY N/A 10/30/2017   Procedure: COLONOSCOPY;  Surgeon: Toledo, Boykin Nearing, MD;  Location: [facility] ENDOSCOPY;  Service: Gastroenterology;  Laterality: N/A;   ESOPHAGOGASTRODUODENOSCOPY N/A 10/30/2017   Procedure: ESOPHAGOGASTRODUODENOSCOPY (EGD);  Surgeon: Toledo, Boykin Nearing, MD;  Location: [facility] ENDOSCOPY;  Service: Gastroenterology;  Laterality: N/A;     A IV Location/Drains/Wounds Patient Lines/Drains/Airways Status      Active Line/Drains/Airways     Name Placement date Placement time Site Days   Peripheral IV 02/02/21 20 G Left Antecubital 02/02/21  --  Antecubital  1            Intake/Output Last 24 hours  Intake/Output Summary (Last 24 hours) at 02/03/2021 1423 Last data filed at 02/03/2021 1202 Gross per 24 hour  Intake 77.66 ml  Output --  Net 77.66 ml    Labs/Imaging Results for orders placed or performed during the hospital encounter of 02/02/21 (from the past 48 hour(s))  Basic metabolic panel     Status: Abnormal   Collection Time: 02/02/21 12:41 PM  Result Value Ref Range   Sodium 139 135 - 145 mmol/L   Potassium 4.1 3.5 - 5.1 mmol/L   Chloride 100 98 - 111 mmol/L   CO2 27 22 - 32 mmol/L   Glucose, Bld 147 (H) 70 - 99 mg/dL    Comment: Glucose reference range applies only to samples taken after fasting for at least 8 hours.   BUN 19 8 - 23 mg/dL   Creatinine, Ser 3.79 0.44 - 1.00 mg/dL   Calcium 9.0 8.9 - 02.4 mg/dL   GFR, Estimated >09 >73 mL/min    Comment: (NOTE) Calculated using the CKD-EPI Creatinine Equation (2021)    Anion gap 12 5 - 15    Comment: Performed at [facility], [address], [place], [place] [address]  CBC     Status: None   Collection Time: 02/02/21 12:41 PM  Result Value  Ref Range   WBC 10.5 4.0 - 10.5 K/uL   RBC 4.59 3.87 - 5.11 MIL/uL   Hemoglobin 13.6 12.0 - 15.0 g/dL   HCT 43.1 36.0 - 46.0 %   MCV 93.9 80.0 - 100.0 fL   MCH 29.6 26.0 - 34.0 pg   MCHC 31.6 30.0 - 36.0 g/dL   RDW 14.4 11.5 - 15.5 %   Platelets 176 150 - 400 K/uL   nRBC 0.0 0.0 - 0.2 %    Comment: Performed at [facility], [address], [place], [place] [address]  Resp Panel by RT-PCR (Flu A&B, Covid) Nasopharyngeal Swab     Status: Abnormal   Collection Time: 02/02/21 12:41 PM   Specimen: Nasopharyngeal Swab; Nasopharyngeal(NP) swabs in vial transport medium  Result Value Ref Range   SARS Coronavirus 2 by RT PCR POSITIVE (A) NEGATIVE     Comment: RESULT CALLED TO, READ BACK BY AND VERIFIED WITH: KENDALL FLANAGAN 02/02/21 1346 KLW (NOTE) SARS-CoV-2 target nucleic acids are DETECTED.  The SARS-CoV-2 RNA is generally detectable in upper respiratory specimens during the acute phase of infection. Positive results are indicative of the presence of the identified virus, but do not rule out bacterial infection or co-infection with other pathogens not detected by the test. Clinical correlation with patient history and other diagnostic information is necessary to determine patient infection status. The expected result is Negative.  Fact Sheet for Patients: EntrepreneurPulse.com.au  Fact Sheet for Healthcare Providers: IncredibleEmployment.be  This test is not yet approved or cleared by the [place] FDA and  has been authorized for detection and/or diagnosis of SARS-CoV-2 by FDA under an Emergency Use Authorization (EUA).  This EUA will remain in effect (meaning this test can b e used) for the duration of  the COVID-19 declaration under Section 564(b)(1) of the Act, 21 U.S.C. section 360bbb-3(b)(1), unless the authorization is terminated or revoked sooner.     Influenza A by PCR NEGATIVE NEGATIVE   Influenza B by PCR NEGATIVE NEGATIVE    Comment: (NOTE) The Xpert Xpress SARS-CoV-2/FLU/RSV plus assay is intended as an aid in the diagnosis of influenza from Nasopharyngeal swab specimens and should not be used as a sole basis for treatment. Nasal washings and aspirates are unacceptable for Xpert Xpress SARS-CoV-2/FLU/RSV testing.  Fact Sheet for Patients: EntrepreneurPulse.com.au  Fact Sheet for Healthcare Providers: IncredibleEmployment.be  This test is not yet approved or cleared by the [place] FDA and has been authorized for detection and/or diagnosis of SARS-CoV-2 by FDA under an Emergency Use Authorization (EUA). This EUA will  remain in effect (meaning this test can be used) for the duration of the COVID-19 declaration under Section 564(b)(1) of the Act, 21 U.S.C. section 360bbb-3(b)(1), unless the authorization is terminated or revoked.  Performed at [facility], [address] [place], [place] [address]   Brain natriuretic peptide     Status: None   Collection Time: 02/02/21  4:53 PM  Result Value Ref Range   B Natriuretic Peptide 11.7 0.0 - 100.0 pg/mL    Comment: Performed at [facility], [address] [place], [place] [address]  D-dimer, quantitative     Status: Abnormal   Collection Time: 02/02/21  4:53 PM  Result Value Ref Range   D-Dimer, Quant 4.32 (H) 0.00 - 0.50 ug/mL-FEU    Comment: (NOTE) At the manufacturer cut-off value of 0.5 g/mL FEU, this assay has a negative predictive value of 95-100%.This assay is intended for use in conjunction with a  clinical pretest probability (PTP) assessment model to exclude pulmonary embolism (PE) and deep venous thrombosis (DVT) in outpatients suspected of PE or DVT. Results should be correlated with clinical presentation. Performed at [facility], [address] [place], [place] [address]   Ferritin     Status: Abnormal   Collection Time: 02/02/21  4:53 PM  Result Value Ref Range   Ferritin 926 (H) 11 - 307 ng/mL    Comment: Performed at [facility], [address] [place], [place] [address]  Fibrinogen     Status: None   Collection Time: 02/02/21  4:53 PM  Result Value Ref Range   Fibrinogen 324 210 - 475 mg/dL    Comment: (NOTE) Fibrinogen results may be underestimated in patients receiving thrombolytic therapy. Performed at [facility], [place], [place] [address]   Lactate dehydrogenase     Status: Abnormal   Collection Time: 02/02/21  4:53 PM  Result Value Ref Range   LDH 257 (H) 98 - 192 U/L    Comment: Performed at [facility], [address]  [place], [place] [address]  Procalcitonin     Status: None   Collection Time: 02/02/21  4:53 PM  Result Value Ref Range   Procalcitonin <0.10 ng/mL    Comment:        Interpretation: PCT (Procalcitonin) <= 0.5 ng/mL: Systemic infection (sepsis) is not likely. Local bacterial infection is possible. (NOTE)       Sepsis PCT Algorithm           Lower Respiratory Tract                                      Infection PCT Algorithm    ----------------------------     ----------------------------         PCT < 0.25 ng/mL                PCT < 0.10 ng/mL          Strongly encourage             Strongly discourage   discontinuation of antibiotics    initiation of antibiotics    ----------------------------     -----------------------------       PCT 0.25 - 0.50 ng/mL            PCT 0.10 - 0.25 ng/mL               OR       >80% decrease in PCT            Discourage initiation of                                            antibiotics      Encourage discontinuation           of antibiotics    ----------------------------     -----------------------------         PCT >= 0.50 ng/mL              PCT 0.26 - 0.50 ng/mL               AND        <80% decrease in PCT  Encourage initiation of                                             antibiotics       Encourage continuation           of antibiotics    ----------------------------     -----------------------------        PCT >= 0.50 ng/mL                  PCT > 0.50 ng/mL               AND         increase in PCT                  Strongly encourage                                      initiation of antibiotics    Strongly encourage escalation           of antibiotics                                     -----------------------------                                           PCT <= 0.25 ng/mL                                                 OR                                        > 80% decrease in PCT                                       Discontinue / Do not initiate                                             antibiotics  Performed at [facility], [address], [place] [address]   Troponin I (High Sensitivity)     Status: None   Collection Time: 02/02/21  4:53 PM  Result Value Ref Range   Troponin I (High Sensitivity) 16 <18 ng/L    Comment: (NOTE) Elevated high sensitivity troponin I (hsTnI) values and significant  changes across serial measurements may suggest ACS but many other  chronic and acute conditions are known to elevate hsTnI results.  Refer to the "Links" section for chest pain algorithms and additional  guidance. Performed at [facility], [address] [place], [place] [address]   TSH     Status: None   Collection Time: 02/02/21  4:53 PM  Result Value Ref Range   TSH 3.374 0.350 - 4.500 uIU/mL    Comment: Performed by a 3rd Generation assay with a functional sensitivity of <=0.01 uIU/mL. Performed at [facility], [address] [place], [place] [address]   Blood gas, venous     Status: Abnormal (Preliminary result)   Collection Time: 02/02/21  5:05 PM  Result Value Ref Range   pH, Ven 7.43 7.250 - 7.430   pCO2, Ven 51 44.0 - 60.0 mmHg   pO2, Ven PENDING 32.0 - 45.0 mmHg   Bicarbonate 33.9 (H) 20.0 - 28.0 mmol/L   Acid-Base Excess 8.1 (H) 0.0 - 2.0 mmol/L   O2 Saturation 31.2 %   Patient temperature 37.0    Collection site VEIN    Sample type VEIN     Comment: Performed at [facility], [address] [place], [place] [address]  Lactic acid, plasma     Status: None   Collection Time: 02/02/21  5:09 PM  Result Value Ref Range   Lactic Acid, Venous 1.2 0.5 - 1.9 mmol/L    Comment: Performed at [facility], [address] [place], [place] [address]  Urinalysis, Routine w reflex microscopic     Status: Abnormal   Collection Time: 02/02/21  5:25 PM  Result Value Ref Range   Color, Urine AMBER (A) YELLOW    Comment:  BIOCHEMICALS MAY BE AFFECTED BY COLOR   APPearance CLOUDY (A) CLEAR   Specific Gravity, Urine 1.025 1.005 - 1.030   pH 5.0 5.0 - 8.0   Glucose, UA NEGATIVE NEGATIVE mg/dL   Hgb urine dipstick NEGATIVE NEGATIVE   Bilirubin Urine NEGATIVE NEGATIVE   Ketones, ur 20 (A) NEGATIVE mg/dL   Protein, ur 100 (A) NEGATIVE mg/dL   Nitrite NEGATIVE NEGATIVE   Leukocytes,Ua NEGATIVE NEGATIVE   RBC / HPF 0-5 0 - 5 RBC/hpf   WBC, UA NONE SEEN 0 - 5 WBC/hpf   Bacteria, UA RARE (A) NONE SEEN   Squamous Epithelial / LPF 0-5 0 - 5    Comment: Performed at [facility], [place], [place] [address]  Troponin I (High Sensitivity)     Status: None   Collection Time: 02/02/21  6:53 PM  Result Value Ref Range   Troponin I (High Sensitivity) 14 <18 ng/L    Comment: (NOTE) Elevated high sensitivity troponin I (hsTnI) values and significant  changes across serial measurements may suggest ACS but many other  chronic and acute conditions are known to elevate hsTnI results.  Refer to the "Links" section for chest pain algorithms and additional  guidance. Performed at [facility], [address] [place], [place] [address]   CBG monitoring, ED     Status: Abnormal   Collection Time: 02/02/21  7:50 PM  Result Value Ref Range   Glucose-Capillary 135 (H) 70 - 99 mg/dL    Comment: Glucose reference range applies only to samples taken after fasting for at least 8 hours.  Lactic acid, plasma     Status: None   Collection Time: 02/02/21  8:09 PM  Result Value Ref Range   Lactic Acid, Venous 0.7 0.5 - 1.9 mmol/L    Comment: Performed at [facility], [address] [place], [place] [address]  CBC with Differential/Platelet     Status: Abnormal   Collection Time: 02/03/21  5:29 AM  Result Value Ref Range   WBC 9.9 4.0 - 10.5 K/uL   RBC 4.14  3.87 - 5.11 MIL/uL   Hemoglobin 12.3 12.0 - 15.0 g/dL   HCT 38.5 36.0 - 46.0 %   MCV 93.0 80.0 - 100.0 fL   MCH 29.7 26.0 -  34.0 pg   MCHC 31.9 30.0 - 36.0 g/dL   RDW 14.2 11.5 - 15.5 %   Platelets 183 150 - 400 K/uL   nRBC 0.0 0.0 - 0.2 %   Neutrophils Relative % 93 %   Neutro Abs 9.2 (H) 1.7 - 7.7 K/uL   Lymphocytes Relative 5 %   Lymphs Abs 0.5 (L) 0.7 - 4.0 K/uL   Monocytes Relative 1 %   Monocytes Absolute 0.1 0.1 - 1.0 K/uL   Eosinophils Relative 0 %   Eosinophils Absolute 0.0 0.0 - 0.5 K/uL   Basophils Relative 0 %   Basophils Absolute 0.0 0.0 - 0.1 K/uL   Immature Granulocytes 1 %   Abs Immature Granulocytes 0.12 (H) 0.00 - 0.07 K/uL    Comment: Performed at [facility], [address] [place], [place] [address]  Comprehensive metabolic panel     Status: Abnormal   Collection Time: 02/03/21  5:29 AM  Result Value Ref Range   Sodium 139 135 - 145 mmol/L   Potassium 4.0 3.5 - 5.1 mmol/L    Comment: HEMOLYSIS AT THIS LEVEL MAY AFFECT RESULT   Chloride 103 98 - 111 mmol/L   CO2 25 22 - 32 mmol/L   Glucose, Bld 190 (H) 70 - 99 mg/dL    Comment: Glucose reference range applies only to samples taken after fasting for at least 8 hours.   BUN 15 8 - 23 mg/dL   Creatinine, Ser 0.73 0.44 - 1.00 mg/dL   Calcium 8.2 (L) 8.9 - 10.3 mg/dL   Total Protein 7.0 6.5 - 8.1 g/dL   Albumin 3.0 (L) 3.5 - 5.0 g/dL   AST 35 15 - 41 U/L   ALT 20 0 - 44 U/L   Alkaline Phosphatase 86 38 - 126 U/L   Total Bilirubin 1.4 (H) 0.3 - 1.2 mg/dL   GFR, Estimated >60 >60 mL/min    Comment: (NOTE) Calculated using the CKD-EPI Creatinine Equation (2021)    Anion gap 11 5 - 15    Comment: Performed at [facility], [address] [place], [place] [address]  D-dimer, quantitative     Status: Abnormal   Collection Time: 02/03/21  5:29 AM  Result Value Ref Range   D-Dimer, Quant 4.73 (H) 0.00 - 0.50 ug/mL-FEU    Comment: (NOTE) At the manufacturer cut-off value of 0.5 g/mL FEU, this assay has a negative predictive value of 95-100%.This assay is intended for use in conjunction with a clinical pretest  probability (PTP) assessment model to exclude pulmonary embolism (PE) and deep venous thrombosis (DVT) in outpatients suspected of PE or DVT. Results should be correlated with clinical presentation. Performed at [facility], [address] [place], [place] [address]   Ferritin     Status: Abnormal   Collection Time: 02/03/21  5:29 AM  Result Value Ref Range   Ferritin 1,172 (H) 11 - 307 ng/mL    Comment: Performed at [facility] [address] [place], [place] [address]  Magnesium     Status: None   Collection Time: 02/03/21  5:29 AM  Result Value Ref Range   Magnesium 2.0 1.7 - 2.4 mg/dL    Comment: Performed at [facility] [place], [address], [place], [place] [address]  Phosphorus  Status: None   Collection Time: 02/03/21  5:29 AM  Result Value Ref Range   Phosphorus 3.2 2.5 - 4.6 mg/dL    Comment: Performed at [facility], [address] [place], [place] [address]  CBG monitoring, ED     Status: Abnormal   Collection Time: 02/03/21  7:38 AM  Result Value Ref Range   Glucose-Capillary 145 (H) 70 - 99 mg/dL    Comment: Glucose reference range applies only to samples taken after fasting for at least 8 hours.  CBG monitoring, ED     Status: Abnormal   Collection Time: 02/03/21 10:59 AM  Result Value Ref Range   Glucose-Capillary 185 (H) 70 - 99 mg/dL    Comment: Glucose reference range applies only to samples taken after fasting for at least 8 hours.   DG Chest 2 View  Result Date: 02/02/2021 CLINICAL DATA:  Shortness of breath. EXAM: CHEST - 2 VIEW COMPARISON:  May 27, 2018. FINDINGS: The heart size and mediastinal contours are within normal limits. Mild bibasilar subsegmental atelectasis or scarring is noted. The visualized skeletal structures are unremarkable. IMPRESSION: Mild bibasilar subsegmental atelectasis or scarring. Aortic Atherosclerosis (ICD10-I70.0). Electronically Signed   By: Marijo Conception M.D.   On: 02/02/2021  13:13    Pending Labs Unresulted Labs (From admission, onward)     Start     Ordered   02/03/21 0500  CBC with Differential/Platelet  Daily,   STAT      02/02/21 1654   02/03/21 0500  Comprehensive metabolic panel  Daily,   STAT      02/02/21 1654   02/03/21 0500  C-reactive protein  Daily,   STAT      02/02/21 1654   02/03/21 0500  D-dimer, quantitative  Daily,   STAT      02/02/21 1654   02/03/21 0500  Ferritin  Daily,   STAT      02/02/21 1654   02/03/21 0500  Magnesium  Daily,   STAT      02/02/21 1654   02/03/21 0500  Phosphorus  Daily,   STAT      02/02/21 1654            Vitals/Pain Today's Vitals   02/03/21 1157 02/03/21 1300 02/03/21 1400 02/03/21 1418  BP: (!) 141/83 (!) 148/91 136/80 128/86  Pulse: (!) 113 (!) 110 (!) 106 (!) 109  Resp: (!) 21 (!) 24 15 (!) 21  Temp:    98.1 F (36.7 C)  TempSrc:    Oral  SpO2: 94% 95% 96% 96%  Weight:      Height:      PainSc: 0-No pain   0-No pain    Isolation Precautions Airborne and Contact precautions  Medications Medications  docusate sodium (COLACE) capsule 100 mg (has no administration in time range)  pantoprazole (PROTONIX) EC tablet 40 mg (40 mg Oral Not Given 02/03/21 1109)  ferrous sulfate tablet 325 mg (325 mg Oral Not Given 02/03/21 0837)  vitamin B-12 (CYANOCOBALAMIN) tablet 1,000 mcg (1,000 mcg Oral Given 02/03/21 1106)  methocarbamol (ROBAXIN) tablet 500 mg (has no administration in time range)  cholecalciferol (VITAMIN D3) tablet 1,000 Units (1,000 Units Oral Given 02/03/21 1154)  ascorbic acid (VITAMIN C) tablet 500 mg (500 mg Oral Not Given 02/03/21 1111)  albuterol (PROVENTIL) (2.5 MG/3ML) 0.083% nebulizer solution 3 mL (has no administration in time range)  mometasone-formoterol (DULERA) 200-5 MCG/ACT inhaler 2 puff (2 puffs Inhalation Given 02/03/21 1112)  tiotropium (SPIRIVA) inhalation  capsule [facility] use ONLY) 18 mcg (18 mcg Inhalation Given 02/03/21 1104)  enoxaparin (LOVENOX) injection 40  mg (40 mg Subcutaneous Given 02/02/21 2034)  remdesivir 200 mg in sodium chloride 0.9% 250 mL IVPB (0 mg Intravenous Stopped 02/02/21 2033)    Followed by  remdesivir 100 mg in sodium chloride 0.9 % 100 mL IVPB (0 mg Intravenous Stopped 02/03/21 1202)  methylPREDNISolone sodium succinate (SOLU-MEDROL) 125 mg/2 mL injection 43.125 mg (43.125 mg Intravenous Given 02/03/21 0525)    Followed by  predniSONE (DELTASONE) tablet 50 mg (has no administration in time range)  guaiFENesin-dextromethorphan (ROBITUSSIN DM) 100-10 MG/5ML syrup 10 mL (has no administration in time range)  insulin aspart (novoLOG) injection 0-15 Units (3 Units Subcutaneous Given 02/03/21 1155)  ondansetron (ZOFRAN) tablet 4 mg (has no administration in time range)    Or  ondansetron (ZOFRAN) injection 4 mg (has no administration in time range)  acetaminophen (TYLENOL) tablet 650 mg (has no administration in time range)    Mobility walks with device Low fall risk   Focused Assessments    R Recommendations: See Admitting Provider Note  Report given to:   Additional Notes:

## 2021-02-04 DIAGNOSIS — E663 Overweight: Secondary | ICD-10-CM | POA: Diagnosis not present

## 2021-02-04 DIAGNOSIS — J441 Chronic obstructive pulmonary disease with (acute) exacerbation: Secondary | ICD-10-CM | POA: Diagnosis not present

## 2021-02-04 DIAGNOSIS — J9621 Acute and chronic respiratory failure with hypoxia: Secondary | ICD-10-CM

## 2021-02-04 DIAGNOSIS — U071 COVID-19: Secondary | ICD-10-CM | POA: Diagnosis not present

## 2021-02-04 LAB — CBC WITH DIFFERENTIAL/PLATELET
Abs Immature Granulocytes: 0.27 10*3/uL — ABNORMAL HIGH (ref 0.00–0.07)
Basophils Absolute: 0 10*3/uL (ref 0.0–0.1)
Basophils Relative: 0 %
Eosinophils Absolute: 0 10*3/uL (ref 0.0–0.5)
Eosinophils Relative: 0 %
HCT: 36.6 % (ref 36.0–46.0)
Hemoglobin: 11.8 g/dL — ABNORMAL LOW (ref 12.0–15.0)
Immature Granulocytes: 2 %
Lymphocytes Relative: 5 %
Lymphs Abs: 0.6 10*3/uL — ABNORMAL LOW (ref 0.7–4.0)
MCH: 29.9 pg (ref 26.0–34.0)
MCHC: 32.2 g/dL (ref 30.0–36.0)
MCV: 92.7 fL (ref 80.0–100.0)
Monocytes Absolute: 0.4 10*3/uL (ref 0.1–1.0)
Monocytes Relative: 4 %
Neutro Abs: 11.1 10*3/uL — ABNORMAL HIGH (ref 1.7–7.7)
Neutrophils Relative %: 89 %
Platelets: 210 10*3/uL (ref 150–400)
RBC: 3.95 MIL/uL (ref 3.87–5.11)
RDW: 14 % (ref 11.5–15.5)
WBC: 12.4 10*3/uL — ABNORMAL HIGH (ref 4.0–10.5)
nRBC: 0 % (ref 0.0–0.2)

## 2021-02-04 LAB — COMPREHENSIVE METABOLIC PANEL
ALT: 20 U/L (ref 0–44)
AST: 23 U/L (ref 15–41)
Albumin: 2.9 g/dL — ABNORMAL LOW (ref 3.5–5.0)
Alkaline Phosphatase: 77 U/L (ref 38–126)
Anion gap: 10 (ref 5–15)
BUN: 21 mg/dL (ref 8–23)
CO2: 27 mmol/L (ref 22–32)
Calcium: 8.8 mg/dL — ABNORMAL LOW (ref 8.9–10.3)
Chloride: 106 mmol/L (ref 98–111)
Creatinine, Ser: 0.69 mg/dL (ref 0.44–1.00)
GFR, Estimated: >60 mL/min (ref >60)
Glucose, Bld: 208 mg/dL — ABNORMAL HIGH (ref 70–99)
Potassium: 3.5 mmol/L (ref 3.5–5.1)
Sodium: 143 mmol/L (ref 135–145)
Total Bilirubin: 0.9 mg/dL (ref 0.3–1.2)
Total Protein: 6.6 g/dL (ref 6.5–8.1)

## 2021-02-04 LAB — C-REACTIVE PROTEIN: CRP: 21.1 mg/dL — ABNORMAL HIGH (ref <1.0)

## 2021-02-04 LAB — D-DIMER, QUANTITATIVE: D-Dimer, Quant: 6.6 ug/mL-FEU — ABNORMAL HIGH (ref 0.00–0.50)

## 2021-02-04 LAB — FERRITIN: Ferritin: 1599 ng/mL — ABNORMAL HIGH (ref 11–307)

## 2021-02-04 LAB — MAGNESIUM: Magnesium: 1.9 mg/dL (ref 1.7–2.4)

## 2021-02-04 LAB — PHOSPHORUS: Phosphorus: 2.7 mg/dL (ref 2.5–4.6)

## 2021-02-04 LAB — GLUCOSE, CAPILLARY
Glucose-Capillary: 177 mg/dL — ABNORMAL HIGH (ref 70–99)
Glucose-Capillary: 185 mg/dL — ABNORMAL HIGH (ref 70–99)

## 2021-02-04 MED ORDER — PHENOL 1.4 % MT LIQD
1.0000 | OROMUCOSAL | Status: DC | PRN
  Filled 2021-02-04: qty 177

## 2021-02-04 MED ORDER — MENTHOL 3 MG MT LOZG
1.0000 | LOZENGE | OROMUCOSAL | Status: DC | PRN
  Filled 2021-02-04: qty 9

## 2021-02-04 MED ORDER — ADULT MULTIVITAMIN W/MINERALS CH: 1.0000 | ORAL_TABLET | Freq: Every day | ORAL | Status: DC

## 2021-02-04 MED ORDER — PREDNISONE 10 MG PO TABS: ORAL_TABLET | ORAL | 0 refills | Status: AC

## 2021-02-04 NOTE — Progress Notes (Signed)
Initial Nutrition Assessment  DOCUMENTATION CODES:   Not applicable  INTERVENTION:   -MVI with minerals daily -Pudding with all meals -Ice cream with all meals -House trays being sent per pt request  NUTRITION DIAGNOSIS:   Increased nutrient needs related to acute illness (COVID-19) as evidenced by estimated needs.  GOAL:   Patient will meet greater than or equal to 90% of their needs  MONITOR:   PO intake, Supplement acceptance, Labs, Weight trends, Skin, I & O's  REASON FOR ASSESSMENT:   Malnutrition Screening Tool    ASSESSMENT:   [name] [name] is a 75 y.o. female with medical history significant of COPD, chronic hypoxic respite failure on 3 L 24/7, came with worsening of her cough wheezing and shortness of breath.  Pt admitted with respiratory failure secondary to COVID-19 and COPD.   Reviewed I/O's: +78 ml x 24 hours  Spoke with pt over the phone, who reports a general decline in health since being diagnosed with COVID 2-3 days ago. She complains of lack of taste and smell as well as a sore throat due to a medication getting dislodged in her throat.   Per pt, food is not appetizing and can only eat about 2-3 bites at meals. She refused offer of oral nutrition supplements, reporting "those are nastiest things I've ever tasted".   Per pt, her UBW is arounf 200# and suspects she has lost about 20# in the past few days. However, this is not consistent with wt hx, which reveals wt stability.   Discussed importance of good meal and supplement intake to promote healing. Pt refusing supplements, but desires pudding and ice cream with trays.   Medications reviewed and include cardizem, solu-medrol, ferrous sulfate, vitamin B-12, and remdesivir.   Labs reviewed: CBGS: 175-190 (inpatient orders for glycemic control are none).    Diet Order:   Diet Order             Diet regular Room service appropriate? Yes; Fluid consistency: Thin  Diet effective now                    EDUCATION NEEDS:   Education needs have been addressed  Skin:  Skin Assessment: Reviewed RN Assessment  Last BM:  Unknown  Height:   Ht Readings from Last 1 Encounters:  02/02/21 5\' 7"  (1.702 m)    Weight:   Wt Readings from Last 1 Encounters:  02/02/21 86.2 kg    Ideal Body Weight:  61.4 kg  BMI:  Body mass index is 29.76 kg/m.  Estimated Nutritional Needs:   Kcal:  1850-2050  Protein:  90-105 grams  Fluid:  > 1.8 L    02/04/21, RD, LDN, CDCES Registered Dietitian II Certified Diabetes Care and Education Specialist Please refer to [facility] for RD and/or RD on-call/weekend/after hours pager

## 2021-02-04 NOTE — Discharge Summary (Signed)
Discharge Summary  [name] [name] JQZ:[id number] DOB: 08-Mar-1946  PCP: Barbette Reichmann, MD  Admit date: 02/02/2021 Discharge date: 02/04/2021  Time spent: 25 minutes  Recommendations for Outpatient Follow-up:  New medication: Steroid taper over the next 4 days and back to baseline of 5 mg twice a day Patient will follow up with her as scheduled in a couple of weeks  Discharge Diagnoses:  Active Hospital Problems   Diagnosis Date Noted   Acute respiratory failure with hypercapnia (HCC) 09/04/2014   Overweight (BMI 25.0-29.9) 02/03/2021   Hypertension    COVID 02/02/2021   COPD (chronic obstructive pulmonary disease) (HCC) 09/04/2014    Resolved Hospital Problems  No resolved problems to display.    Discharge Condition: Improved, being discharged home  Diet recommendation: Heart healthy  Vitals:   02/04/21 0910 02/04/21 1152  BP: (!) 142/77 124/61  Pulse: (!) 113 (!) 111  Resp:  18  Temp:  98.6 F (37 C)  SpO2:  92%    History of present illness:  75 year old female with past medical history of COPD normally on 3 L nasal cannula and hypertension presented to the emergency room on 11/18 with complaints of cough and shortness of breath and found to be hypoxic requiring 4 L nasal cannula to keep oxygen saturations above 90%.  Patient admitted to the hospitalist service and started on steroids and Remdisivir.  Hospital Course:  Principal Problem:   Acute on chronic respiratory failure with hypercapnia (HCC) secondary to COPD exacerbation/COVID infection: By day of discharge, patient down to 2.5 L nasal cannula.  This is actually better than her baseline, normally on 3 L.  She has finished 3 days of Remdisivir.  She is feeling at her baseline in terms of respiratory status.  Will discharge on steroid taper. Active Problems:    Overweight (BMI 25.0-29.9): Meets criteria with BMI greater than 25    Hypertension: Continue home  medications   Procedures: None  Consultations: None  Discharge Exam: BP 124/61 (BP Location: Left Arm)   Pulse (!) 111   Temp 98.6 F (37 C) (Oral)   Resp 18   Ht 5\' 7"  (1.702 m)   Wt 86.2 kg   SpO2 92%   BMI 29.76 kg/m   General: Alert and oriented x3, no acute distress Cardiovascular: Regular rate and rhythm, S1-S2 Respiratory: Decreased breath sounds throughout bilaterally  Discharge Instructions You were cared for by a hospitalist during your hospital stay. If you have any questions about your discharge medications or the care you received while you were in the hospital after you are discharged, you can call the unit and asked to speak with the hospitalist on call if the hospitalist that took care of you is not available. Once you are discharged, your primary care physician will handle any further medical issues. Please note that NO REFILLS for any discharge medications will be authorized once you are discharged, as it is imperative that you return to your primary care physician (or establish a relationship with a primary care physician if you do not have one) for your aftercare needs so that they can reassess your need for medications and monitor your lab values.  Discharge Instructions     Diet - low sodium heart healthy   Complete by: As directed    Increase activity slowly   Complete by: As directed       Allergies as of 02/04/2021   No Known Allergies      Medication List  TAKE these medications    albuterol 108 (90 Base) MCG/ACT inhaler Commonly known as: VENTOLIN HFA Inhale 2 puffs into the lungs every 6 (six) hours as needed for wheezing or shortness of breath.   cholecalciferol 1000 units tablet Commonly known as: VITAMIN D Take 1,000 Units by mouth daily with lunch.   docusate sodium 100 MG capsule Commonly known as: COLACE Take 100 mg by mouth 2 (two) times daily as needed for mild constipation or moderate constipation.   ferrous sulfate  325 (65 FE) MG tablet Take 1 tablet (325 mg total) by mouth 2 (two) times daily with a meal.   Fluticasone-Salmeterol 500-50 MCG/DOSE Aepb Commonly known as: ADVAIR Inhale 1 puff into the lungs every 12 (twelve) hours.   furosemide 20 MG tablet Commonly known as: LASIX Take 20 mg by mouth daily.   gabapentin 100 MG capsule Commonly known as: NEURONTIN Take 100 mg by mouth at bedtime.   predniSONE 5 MG tablet Commonly known as: DELTASONE Take 5 mg by mouth 2 (two) times daily. What changed: Another medication with the same name was added. Make sure you understand how and when to take each.   predniSONE 10 MG tablet Commonly known as: DELTASONE Take 5 tablets (50 mg total) by mouth daily with breakfast for 1 day, THEN 4 tablets (40 mg total) daily with breakfast for 1 day, THEN 3 tablets (30 mg total) daily with breakfast for 1 day, THEN 2 tablets (20 mg total) daily with breakfast for 1 day. Start taking on: February 04, 2021 What changed: You were already taking a medication with the same name, and this prescription was added. Make sure you understand how and when to take each.   tiotropium 18 MCG inhalation capsule Commonly known as: SPIRIVA Place 1 capsule (18 mcg total) into inhaler and inhale daily. What changed: when to take this   vitamin B-12 1000 MCG tablet Commonly known as: CYANOCOBALAMIN Take 1 tablet (1,000 mcg total) by mouth daily.   vitamin C 500 MG tablet Commonly known as: ASCORBIC ACID Take 500 mg by mouth daily.        No Known Allergies  Follow-up Information     Barbette Reichmann, MD Follow up.   Specialty: Internal Medicine Why: Keep scheduled appointment Contact information: [address] [place] [address] [phone]                  The results of significant diagnostics from this hospitalization (including imaging, microbiology, ancillary and laboratory) are listed below for reference.     Significant Diagnostic Studies: DG Chest 2 View  Result Date: 02/02/2021 CLINICAL DATA:  Shortness of breath. EXAM: CHEST - 2 VIEW COMPARISON:  May 27, 2018. FINDINGS: The heart size and mediastinal contours are within normal limits. Mild bibasilar subsegmental atelectasis or scarring is noted. The visualized skeletal structures are unremarkable. IMPRESSION: Mild bibasilar subsegmental atelectasis or scarring. Aortic Atherosclerosis (ICD10-I70.0). Electronically Signed   By: Lupita Raider M.D.   On: 02/02/2021 13:13    Microbiology: Recent Results (from the past 240 hour(s))  Resp Panel by RT-PCR (Flu A&B, Covid) Nasopharyngeal Swab     Status: Abnormal   Collection Time: 02/02/21 12:41 PM   Specimen: Nasopharyngeal Swab; Nasopharyngeal(NP) swabs in vial transport medium  Result Value Ref Range Status   SARS Coronavirus 2 by RT PCR POSITIVE (A) NEGATIVE Final    Comment: RESULT CALLED TO, READ BACK BY AND VERIFIED WITH: KENDALL FLANAGAN 02/02/21 1346 KLW (  NOTE) SARS-CoV-2 target nucleic acids are DETECTED.  The SARS-CoV-2 RNA is generally detectable in upper respiratory specimens during the acute phase of infection. Positive results are indicative of the presence of the identified virus, but do not rule out bacterial infection or co-infection with other pathogens not detected by the test. Clinical correlation with patient history and other diagnostic information is necessary to determine patient infection status. The expected result is Negative.  Fact Sheet for Patients: BloggerCourse.com  Fact Sheet for Healthcare Providers: SeriousBroker.it  This test is not yet approved or cleared by the [place] FDA and  has been authorized for detection and/or diagnosis of SARS-CoV-2 by FDA under an Emergency Use Authorization (EUA).  This EUA will remain in effect (meaning this test can b e used) for the duration of  the COVID-19  declaration under Section 564(b)(1) of the Act, 21 U.S.C. section 360bbb-3(b)(1), unless the authorization is terminated or revoked sooner.     Influenza A by PCR NEGATIVE NEGATIVE Final   Influenza B by PCR NEGATIVE NEGATIVE Final    Comment: (NOTE) The Xpert Xpress SARS-CoV-2/FLU/RSV plus assay is intended as an aid in the diagnosis of influenza from Nasopharyngeal swab specimens and should not be used as a sole basis for treatment. Nasal washings and aspirates are unacceptable for Xpert Xpress SARS-CoV-2/FLU/RSV testing.  Fact Sheet for Patients: BloggerCourse.com  Fact Sheet for Healthcare Providers: SeriousBroker.it  This test is not yet approved or cleared by the [place] FDA and has been authorized for detection and/or diagnosis of SARS-CoV-2 by FDA under an Emergency Use Authorization (EUA). This EUA will remain in effect (meaning this test can be used) for the duration of the COVID-19 declaration under Section 564(b)(1) of the Act, 21 U.S.C. section 360bbb-3(b)(1), unless the authorization is terminated or revoked.  Performed at [facility], [address] [place], [place] [address]      Labs: Basic Metabolic Panel: Recent Labs  Lab 02/02/21 1241 02/03/21 0529 02/04/21 0332  NA 139 139 143  K 4.1 4.0 3.5  CL 100 103 106  CO2 27 25 27   GLUCOSE 147* 190* 208*  BUN 19 15 21   CREATININE 0.76 0.73 0.69  CALCIUM 9.0 8.2* 8.8*  MG  --  2.0 1.9  PHOS  --  3.2 2.7   Liver Function Tests: Recent Labs  Lab 02/03/21 0529 02/04/21 0332  AST 35 23  ALT 20 20  ALKPHOS 86 77  BILITOT 1.4* 0.9  PROT 7.0 6.6  ALBUMIN 3.0* 2.9*   No results for input(s): LIPASE, AMYLASE in the last 168 hours. No results for input(s): AMMONIA in the last 168 hours. CBC: Recent Labs  Lab 02/02/21 1241 02/03/21 0529 02/04/21 0332  WBC 10.5 9.9 12.4*  NEUTROABS  --  9.2* 11.1*  HGB 13.6 12.3 11.8*  HCT 43.1  38.5 36.6  MCV 93.9 93.0 92.7  PLT 176 183 210   Cardiac Enzymes: No results for input(s): CKTOTAL, CKMB, CKMBINDEX, TROPONINI in the last 168 hours. BNP: BNP (last 3 results) Recent Labs    02/02/21 1653  BNP 11.7    ProBNP (last 3 results) No results for input(s): PROBNP in the last 8760 hours.  CBG: Recent Labs  Lab 02/03/21 1059 02/03/21 1624 02/03/21 2029 02/04/21 0826 02/04/21 1149  GLUCAP 185* 175* 190* 177* 185*       Signed:  02/06/21, MD [facility] 02/04/2021, 2:09 PM

## 2021-02-04 NOTE — Progress Notes (Signed)
   02/04/21 0910  Assess: MEWS Score  BP (!) 142/77  Pulse Rate (!) 113  Level of Consciousness Alert  Assess: MEWS Score  MEWS Temp 0  MEWS Systolic 0  MEWS Pulse 2  MEWS RR 0  MEWS LOC 0  MEWS Score 2  MEWS Score Color Yellow  Assess: if the MEWS score is Yellow or Red  Were vital signs taken at a resting state? Yes  Focused Assessment Change from prior assessment (see assessment flowsheet)  Does the patient meet 2 or more of the SIRS criteria? No  MEWS guidelines implemented *See Row Information* Yes  Treat  MEWS Interventions Other (Comment)  Pain Scale 0-10  Pain Score 9  Pain Type Acute pain  Pain Location Throat  Complains of Anxiety;Agitation;Restless  Interventions Medication (see MAR);Relaxation  Take Vital Signs  Increase Vital Sign Frequency  Yellow: Q 2hr X 2 then Q 4hr X 2, if remains yellow, continue Q 4hrs  Escalate  MEWS: Escalate Yellow: discuss with charge nurse/RN and consider discussing with provider and RRT  Notify: Charge Nurse/RN  Name of Charge Nurse/RN Notified Robyn, RN  Date Charge Nurse/RN Notified 02/04/21  Time Charge Nurse/RN Notified 0912  Notify: Provider  Provider Name/Title Dr. Rito Ehrlich  Date Provider Notified 02/04/21  Time Provider Notified [phone]  Notification Reason Other (Comment)  Provider response Other (Comment) (none)  Date of Provider Response 02/04/21  Time of Provider Response [id number]  Document  Patient Outcome Other (Comment) (patient took her Cardizem waiting to see if it will work on HR)  Progress note created (see row info) Yes  Assess: SIRS CRITERIA  SIRS Temperature  0  SIRS Pulse 1  SIRS Respirations  0  SIRS WBC 0  SIRS Score Sum  1

## 2021-02-04 NOTE — Progress Notes (Signed)
CSW contacted [employer] and spoke with [place]. [employer] will deliver a O2 tank to patients room prior to discharge.

## 2021-02-04 NOTE — Progress Notes (Signed)
Patient being discharged home. IV removed. Went over discharge instructions and medications with patient. Patient stated that she understood and all questions were answered. Patient is with [employer] for her oxygen and her home tank was empty, messaged TOC and [employer] came and brought patient two extra oxygen tanks for home. Patient going home POV with daughter and son in law.

## 2021-02-15 DEATH — deceased

## 2021-02-19 LAB — BLOOD GAS, VENOUS
Acid-Base Excess: 8.1 mmol/L — ABNORMAL HIGH (ref 0.0–2.0)
Bicarbonate: 33.9 mmol/L — ABNORMAL HIGH (ref 20.0–28.0)
O2 Saturation: 31.2 %
Patient temperature: 37
pCO2, Ven: 51 mmHg (ref 44.0–60.0)
pH, Ven: 7.43 (ref 7.250–7.430)

## 2021-12-22 IMAGING — CR DG CHEST 2V
2 series · 2 of 2 positions shown · non-contrast
Comparison: May 27, 2018.

CLINICAL DATA: Shortness of breath.

EXAM:
CHEST - 2 VIEW

[chest lat]
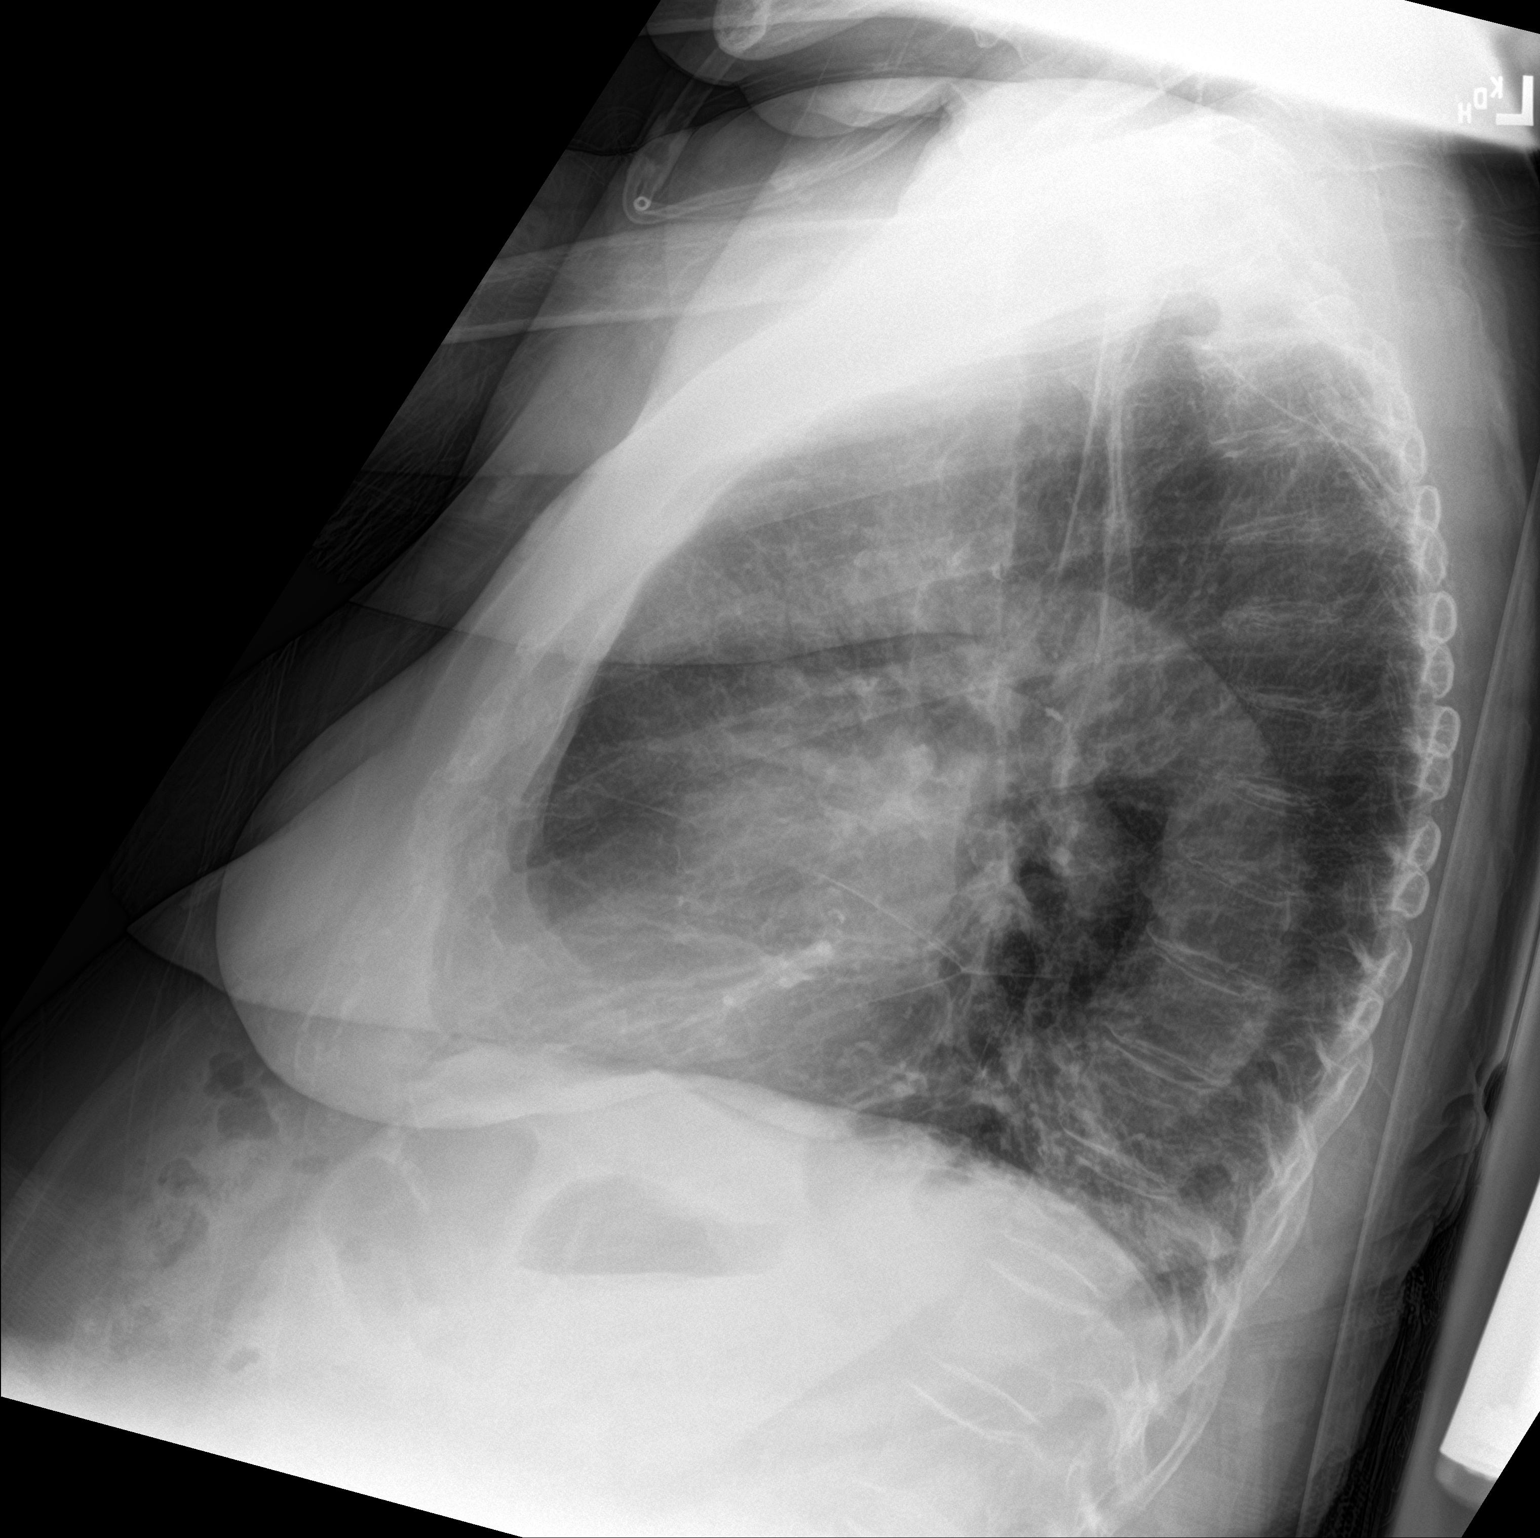

[chest ap]
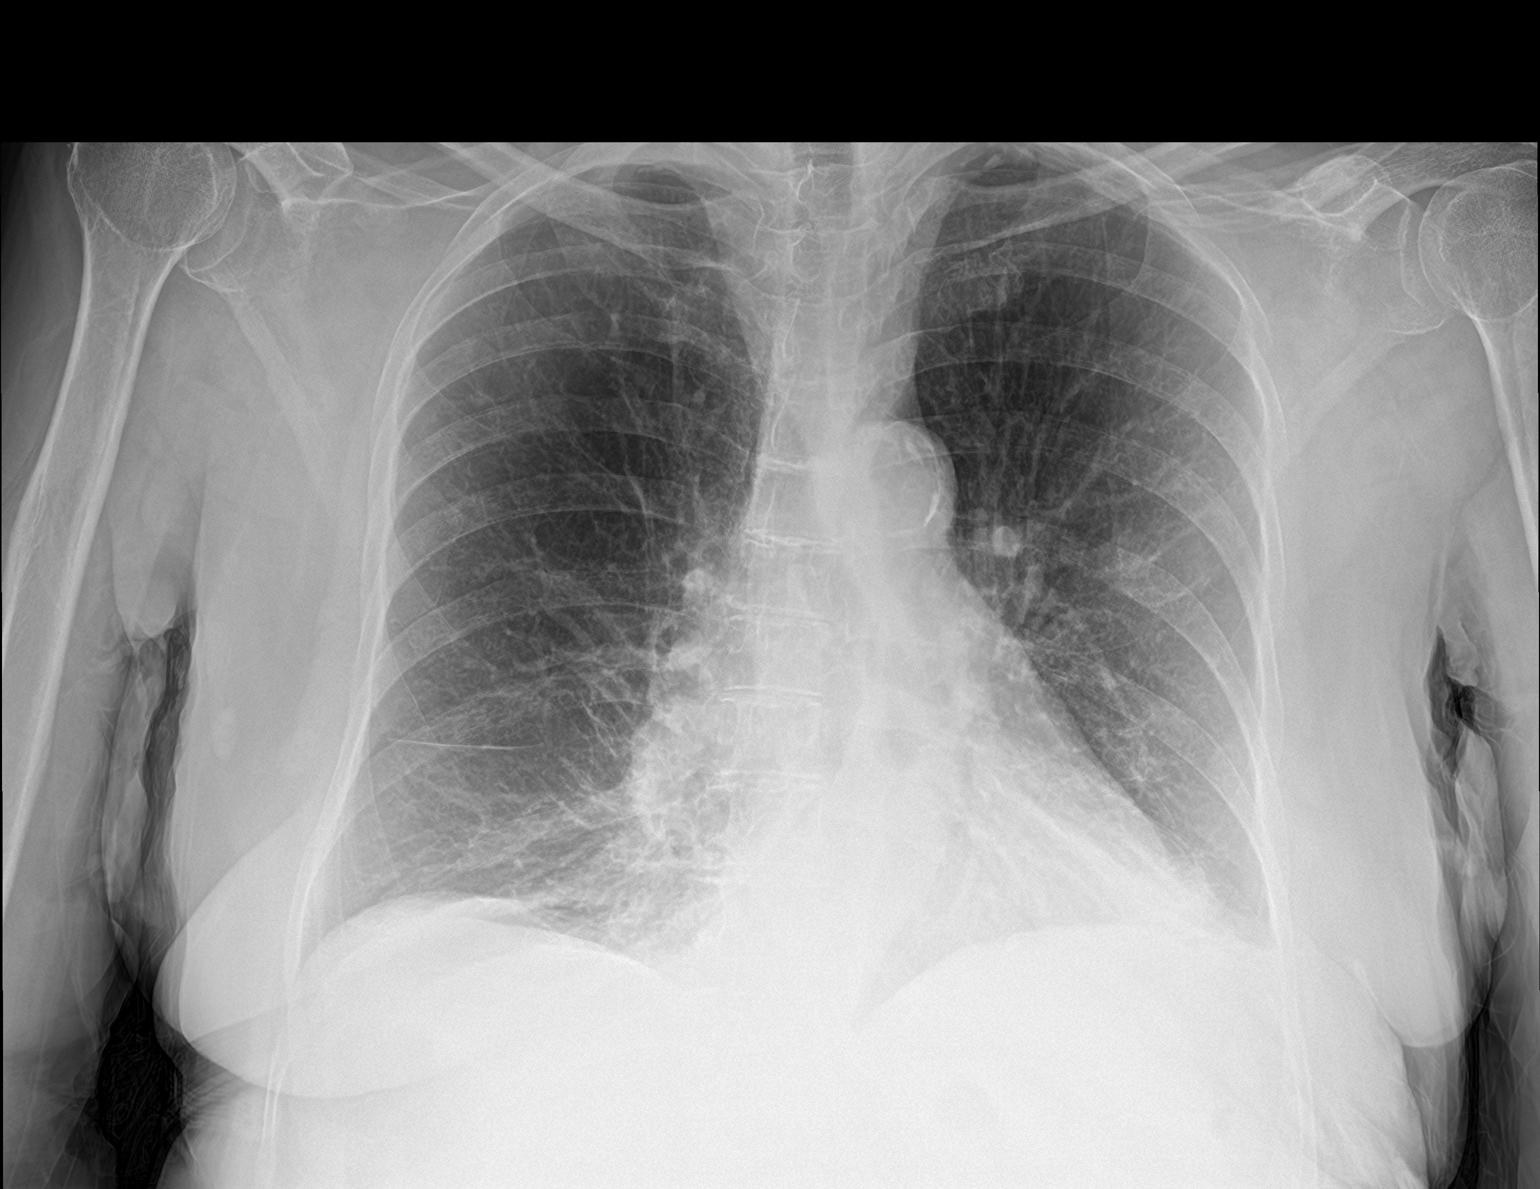

[2 of 2 positions shown; findings below may reference images not displayed]

FINDINGS: The heart size and mediastinal contours are within normal limits.
Mild bibasilar subsegmental atelectasis or scarring is noted. The
visualized skeletal structures are unremarkable.
IMPRESSION: Mild bibasilar subsegmental atelectasis or scarring.

Aortic Atherosclerosis ([id number]).
